# Patient Record
Sex: Male | Born: 1945
Health system: Southern US, Community
[De-identification: ages and names within clinical notes are randomized; demographics above are authoritative.]

## PROBLEM LIST (undated history)

## (undated) DIAGNOSIS — Z9289 Personal history of other medical treatment: Secondary | ICD-10-CM

## (undated) DIAGNOSIS — N200 Calculus of kidney: Secondary | ICD-10-CM

## (undated) DIAGNOSIS — I447 Left bundle-branch block, unspecified: Secondary | ICD-10-CM

## (undated) DIAGNOSIS — I10 Essential (primary) hypertension: Secondary | ICD-10-CM

## (undated) DIAGNOSIS — I2510 Atherosclerotic heart disease of native coronary artery without angina pectoris: Secondary | ICD-10-CM

## (undated) DIAGNOSIS — E785 Hyperlipidemia, unspecified: Secondary | ICD-10-CM

## (undated) DIAGNOSIS — N400 Enlarged prostate without lower urinary tract symptoms: Secondary | ICD-10-CM

## (undated) HISTORY — DX: Calculus of kidney: N20.0

## (undated) HISTORY — DX: Personal history of other medical treatment: Z92.89

## (undated) HISTORY — DX: Benign prostatic hyperplasia without lower urinary tract symptoms: N40.0

## (undated) HISTORY — DX: Left bundle-branch block, unspecified: I44.7

## (undated) HISTORY — DX: Hyperlipidemia, unspecified: E78.5

## (undated) HISTORY — PX: COLONOSCOPY: SHX174

---

## 1974-04-21 HISTORY — PX: APPENDECTOMY: SHX54

## 1997-08-18 ENCOUNTER — Other Ambulatory Visit: Admission: RE | Admit: 1997-08-18 | Discharge: 1997-08-18 | Payer: Self-pay | Admitting: Internal Medicine

## 2003-11-01 ENCOUNTER — Ambulatory Visit (HOSPITAL_COMMUNITY): Admission: RE | Admit: 2003-11-01 | Discharge: 2003-11-01 | Payer: Self-pay | Admitting: Gastroenterology

## 2011-10-15 ENCOUNTER — Encounter (HOSPITAL_COMMUNITY): Payer: Self-pay

## 2011-10-15 ENCOUNTER — Emergency Department (INDEPENDENT_AMBULATORY_CARE_PROVIDER_SITE_OTHER)
Admission: EM | Admit: 2011-10-15 | Discharge: 2011-10-15 | Disposition: A | Discharge status: Home / Self Care | Payer: 59 | Source: Home / Self Care | Attending: Emergency Medicine | Admitting: Emergency Medicine

## 2011-10-15 DIAGNOSIS — K649 Unspecified hemorrhoids: Secondary | ICD-10-CM

## 2011-10-15 HISTORY — DX: Essential (primary) hypertension: I10

## 2011-10-15 MED ORDER — HYDROCORTISONE ACE-PRAMOXINE 2.5-1 % RE CREA: TOPICAL_CREAM | Freq: Three times a day (TID) | RECTAL | Status: AC

## 2011-10-15 NOTE — Discharge Instructions (Signed)
Make sure you keep your stools soft. He may drink up to his, apparent used to make sure you drink plenty of water. Start taking MiraLax or another stool softener that works well for you. Followup with Dr. Loreta Ave in the next week if you're not getting better. Return to the ER if you have a fever above 100.4, if you start vomiting, or any other concerns.

## 2011-10-15 NOTE — ED Provider Notes (Signed)
History     CSN: [id number]  Arrival date & time 10/15/11  1450   First MD Initiated Contact with Patient 10/15/11 1613      Chief Complaint  Patient presents with  . Rectal Pain    (Consider location/radiation/quality/duration/timing/severity/associated sxs/prior treatment) HPI Comments: Patient presents complaining of rectal "fullness", started several days ago after doing some heavy lifting/digging a hole. Had normal, soft bowel movement this morning. Denies constipation, rectal bleeding, melena, hematochezia, abdominal pain. No urinary complaints. No unintentional weight loss. This is feels similar to previous episodes of hemorrhoids, and he has started using Preparation H without any improvement. No aggravating factors. He has had colonoscopy 5 years ago, states that it was normal. He has scheduled a have a colonoscopy in about a month.  ROS as noted in HPI. All other ROS negative.   The history is provided by the patient. No language interpreter was used.    Past Medical History  Diagnosis Date  . Hypertension   . Diabetes mellitus     pre diabetic    History reviewed. No pertinent past surgical history.  History reviewed. No pertinent family history.  History  Substance Use Topics  . Smoking status: Never Smoker   . Smokeless tobacco: Not on file  . Alcohol Use: No      Review of Systems  Allergies  Review of patient's allergies indicates no known allergies.  Home Medications   Current Outpatient Rx  Name Route Sig Dispense Refill  . VITAMIN C 1000 MG PO TABS Oral Take 1,000 mg by mouth daily.    Marland Kitchen EZETIMIBE-SIMVASTATIN 10-20 MG PO TABS Oral Take 1 tablet by mouth at bedtime.    . IBUPROFEN 200 MG PO TABS Oral Take 200 mg by mouth every morning.    Marland Kitchen METFORMIN HCL 1000 MG PO TABS Oral Take 500 mg by mouth once.    Marland Kitchen PHENYLEPH-SHARK LIV OIL-MO-PET 0.25-3-14-71.9 % RE OINT Rectal Place rectally 2 (two) times daily as needed.    Marland Kitchen VITAMIN B-12 100 MCG PO  TABS Oral Take 50 mcg by mouth 3 (three) times a week.    Marland Kitchen HYDROCORTISONE ACE-PRAMOXINE 2.5-1 % RE CREA Rectal Place rectally 3 (three) times daily. 30 g 0    BP 144/57  Pulse 70  Temp 98.4 F (36.9 C) (Oral)  Resp 20  SpO2 100%  Physical Exam  Nursing note and vitals reviewed. Constitutional: He is oriented to person, place, and time. He appears well-developed and well-nourished.  HENT:  Head: Normocephalic and atraumatic.  Eyes: Conjunctivae and EOM are normal.  Neck: Normal range of motion.  Cardiovascular: Normal rate.   Pulmonary/Chest: Effort normal. No respiratory distress.  Abdominal: He exhibits no distension.  Genitourinary: Prostate normal. Rectal exam shows internal hemorrhoid. Rectal exam shows no external hemorrhoid, no fissure, no mass, no tenderness and anal tone normal. Guaiac negative stool.       Several nonbleeding internal hemorrhoids. Soft stool in rectal vault.  Musculoskeletal: Normal range of motion.  Neurological: He is alert and oriented to person, place, and time. Coordination normal.  Skin: Skin is warm and dry.  Psychiatric: He has a normal mood and affect. His behavior is normal. Judgment and thought content normal.    ED Course  Procedures (including critical care time)   Labs Reviewed  OCCULT BLOOD, POC DEVICE   No results found.   1. Hemorrhoids     MDM  Previous records reviewed, was last seen by GI in 2005. No evidence  of external thrombosed hemorrhoids, active bleeding., With Analpram -HC, and Will have him followup with Dr. Loreta Ave, his GI physician. Advised increased fluid intake, to avoid heavy lifting, and to keep his stool soft. Will start MiraLax as needed. Discussed signs and symptoms that should prompt his return. Patient agrees with plan.  Luiz Blare, MD 10/15/11 2000

## 2011-10-15 NOTE — ED Notes (Signed)
States he has had a history of hemorrhoids in the past, usually reponds to home treatment , but has had pain past 2-3 days. Small  stool passed x 2 today

## 2015-04-27 MED FILL — SIMVASTATIN 20 MG TABLET: 20 | 90 days supply | Qty: 90 | Fill #0

## 2015-05-01 DIAGNOSIS — R05 Cough: Secondary | ICD-10-CM | POA: Diagnosis not present

## 2015-05-28 MED FILL — AZITHROMYCIN 250 MG TABLET: 250 | 5 days supply | Qty: 6 | Fill #0

## 2015-06-18 DIAGNOSIS — L309 Dermatitis, unspecified: Secondary | ICD-10-CM | POA: Diagnosis not present

## 2015-06-18 DIAGNOSIS — Z7984 Long term (current) use of oral hypoglycemic drugs: Secondary | ICD-10-CM | POA: Diagnosis not present

## 2015-06-18 DIAGNOSIS — E785 Hyperlipidemia, unspecified: Secondary | ICD-10-CM | POA: Diagnosis not present

## 2015-06-18 DIAGNOSIS — R5383 Other fatigue: Secondary | ICD-10-CM | POA: Diagnosis not present

## 2015-06-18 DIAGNOSIS — I10 Essential (primary) hypertension: Secondary | ICD-10-CM | POA: Diagnosis not present

## 2015-06-18 DIAGNOSIS — E119 Type 2 diabetes mellitus without complications: Secondary | ICD-10-CM | POA: Diagnosis not present

## 2015-06-18 MED FILL — TRIAMCINOLONE 0.1% CREAM: 0.1 | 15 days supply | Qty: 30 | Fill #0

## 2015-06-18 MED FILL — AMLODIPINE BESYLATE 10 MG T: 10 | 30 days supply | Qty: 30 | Fill #3

## 2015-06-20 MED FILL — METFORMIN HCL ER 500 MG TAB: 500 | 30 days supply | Qty: 60 | Fill #0

## 2015-07-05 DIAGNOSIS — G4710 Hypersomnia, unspecified: Secondary | ICD-10-CM | POA: Diagnosis not present

## 2015-07-11 DIAGNOSIS — R079 Chest pain, unspecified: Secondary | ICD-10-CM | POA: Diagnosis not present

## 2015-07-12 DIAGNOSIS — I4891 Unspecified atrial fibrillation: Secondary | ICD-10-CM | POA: Insufficient documentation

## 2015-07-12 DIAGNOSIS — R000 Tachycardia, unspecified: Secondary | ICD-10-CM | POA: Insufficient documentation

## 2015-07-17 ENCOUNTER — Encounter: Payer: Self-pay | Admitting: Physician Assistant

## 2015-07-17 ENCOUNTER — Ambulatory Visit (INDEPENDENT_AMBULATORY_CARE_PROVIDER_SITE_OTHER): Payer: PPO | Admitting: Physician Assistant

## 2015-07-17 VITALS — BP 142/60 | HR 68 | Ht 68.0 in | Wt 187.6 lb

## 2015-07-17 DIAGNOSIS — I447 Left bundle-branch block, unspecified: Secondary | ICD-10-CM

## 2015-07-17 DIAGNOSIS — R9431 Abnormal electrocardiogram [ECG] [EKG]: Secondary | ICD-10-CM

## 2015-07-17 DIAGNOSIS — R0602 Shortness of breath: Secondary | ICD-10-CM | POA: Diagnosis not present

## 2015-07-17 DIAGNOSIS — E119 Type 2 diabetes mellitus without complications: Secondary | ICD-10-CM

## 2015-07-17 DIAGNOSIS — I10 Essential (primary) hypertension: Secondary | ICD-10-CM

## 2015-07-17 DIAGNOSIS — E785 Hyperlipidemia, unspecified: Secondary | ICD-10-CM

## 2015-07-17 NOTE — Progress Notes (Signed)
Cardiology Office Note:    Date:  07/17/2015   ID:  [name] [name], DOB [birth year], MRN [id number]  PCP:  Jonathon Bellows, MD  Cardiologist:  New - Dr. Jenkins Rouge   Electrophysiologist:  N/a  Referring MD: Dr. Maurice Small   Chief Complaint  Patient presents with  . Shortness of Breath    Arm pain; LBBB    History of Present Illness:     [name] [name] is a 70 y.o. male with a hx of HTN, HL, DM2, FHx CAD.  He is referred by his PCP for further evaluation of DOE and episodic L arm pain.  He had his first episode of L arm pain in 04/2015 when he was sick with URI.  He had another episode of L arm pain 2 weeks ago.  This occurred after eating.  He denies any assoc dyspepsia or belching.  He did have assoc dyspnea and diaphoresis.  He denies any chest pain or exertional symptoms.  He has noted DOE.  His wife is more concerned about his dyspnea.  He does not feel like this is worsening.  He denies orthopnea, PND, edema, syncope.     Past Medical History  Diagnosis Date  . Hypertension   . Diabetes mellitus   . HLD (hyperlipidemia)   . BPH (benign prostatic hyperplasia)     [facility]  . Nephrolithiasis     Past Surgical History  Procedure Laterality Date  . Appendectomy  1976    Current Medications: Outpatient Prescriptions Prior to Visit  Medication Sig Dispense Refill  . Ascorbic Acid (VITAMIN C) 1000 MG tablet Take 1,000 mg by mouth daily.    Marland Kitchen ibuprofen (ADVIL,MOTRIN) 200 MG tablet Take 200 mg by mouth every 8 (eight) hours as needed (pain).     . phenylephrine-shark liver oil-mineral oil-petrolatum (PREPARATION H) 0.25-3-14-71.9 % rectal ointment Place rectally 2 (two) times daily as needed for hemorrhoids.     . vitamin B-12 (CYANOCOBALAMIN) 100 MCG tablet Take 50 mcg by mouth 3 (three) times a week.    . ezetimibe-simvastatin (VYTORIN) 10-20 MG per tablet Take 1 tablet by mouth at bedtime. Reported on 07/17/2015    . metFORMIN (GLUCOPHAGE) 1000 MG tablet Take 500 mg  by mouth once. Reported on 07/17/2015    . nebivolol (BYSTOLIC) 5 MG tablet Take 5 mg by mouth daily. Reported on 07/17/2015    . rivaroxaban (XARELTO) 20 MG TABS tablet Take 20 mg by mouth daily with supper. Reported on 07/17/2015     No facility-administered medications prior to visit.     Allergies:   Review of patient's allergies indicates no known allergies.   Social History   Social History  . Marital Status: Married    Spouse Name: N/A  . Number of Children: N/A  . Years of Education: N/A   Social History Main Topics  . Smoking status: Never Smoker   . Smokeless tobacco: None  . Alcohol Use: No  . Drug Use: No  . Sexual Activity: Not Asked   Other Topics Concern  . None   Social History Narrative   Retired   Married; 1 Licensed [employer] x 30 years   Originally from [place] History:  The patient's family history includes Heart attack (age of onset: 6) in his father; Heart disease in his father and mother; Heart failure (age of onset: 72) in his mother.   ROS:   Please see the  history of present illness.    Review of Systems  Cardiovascular: Positive for dyspnea on exertion.  Skin: Positive for rash.  All other systems reviewed and are negative.   Physical Exam:    VS:  BP 142/60 mmHg  Pulse 68  Ht 5\' 8"  (1.727 m)  Wt 187 lb 9.6 oz (85.095 kg)  BMI 28.53 kg/m2   GEN: Well nourished, well developed, in no acute distress HEENT: normal Neck: no JVD, no masses Cardiac: Normal S1/S2, RRR; short early 1/6 systolic murmur RUSB, rubs, or gallops, no edema;  no carotid bruits,   Respiratory:  clear to auscultation bilaterally; no wheezing, rhonchi or rales GI: soft, nontender, nondistended MS: no deformity or atrophy Skin: warm and dry Neuro: No focal deficits  Psych: Alert and oriented x 3, normal affect  Wt Readings from Last 3 Encounters:  07/17/15 187 lb 9.6 oz (85.095 kg)      Studies/Labs Reviewed:     EKG:   EKG is  ordered today.  The ekg ordered today demonstrates NSR, HR 69, LBBB  Recent Labs: No results found for requested labs within last 365 days.  Labs 06/18/15 (from PCP): Hgb A1c 7.5, Hgb 16.1, BUN 14, creatinine 1.09, K 4.5, ALT 48, AST 34, TSH 2.26  Recent Lipid Panel No results found for: CHOL, TRIG, HDL, CHOLHDL, VLDL, LDLCALC, LDLDIRECT  Additional studies/ records that were reviewed today include:   Echo 10/13 ([place]) Mild LVH, proximal septal thickening, EF > 55%, impaired relaxation, mild to mod LAE, mild MAC, mild MR, mild TR, RVSP 30-45mmHg,    ASSESSMENT:     1. Shortness of breath   2. LBBB (left bundle branch block)   3. Essential hypertension   4. Controlled type 2 diabetes mellitus without complication, without long-term current use of insulin ([place])   5. Hyperlipidemia     PLAN:     In order of problems listed above:  1. Dyspnea - Atypical symptoms for ischemia.  But, he has significant CRFs.  No stress test in 4 years.  His ECG demonstrates LBBB which is old.    -  Lexiscan Myoview  -  Echocardiogram  2. LBBB - Old.  Neg workup in the past.  Obtain stress test and echo as noted.  FU with Dr. Jenkins Rouge in 1 year or sooner if needed.  3. HTN - Borderline control.  Continue to monitor.  4. DM2 - FU with PCP for strict control. Recent A1c 7.5.  5. HL - Continue statin.  This is managed by PCP.     Medication Adjustments/Labs and Tests Ordered: Current medicines are reviewed at length with the patient today.  Concerns regarding medicines are outlined above.  Medication changes, Labs and Tests ordered today are outlined in the Patient Instructions noted below. Patient Instructions  Medication Instructions:  Your physician recommends that you continue on your current medications as directed. Please refer to the Current Medication list given to you today.  Labwork: NONE  Testing/Procedures: 1. Your physician has requested that you have a lexiscan  myoview. For further information please visit [email or web address].[email or web address]. Please follow instruction sheet, as given.  2. Your physician has requested that you have an echocardiogram. Echocardiography is a painless test that uses sound waves to create images of your heart. It provides your doctor with information about the size and shape of your heart and how well your heart's chambers and valves are working. This procedure takes approximately one hour. There are no restrictions for this procedure.  Follow-Up: Your physician wants you to follow-up in: Joiner Blima Singer will receive a reminder letter in the mail two months in advance. If you don't receive a letter, please call our office to schedule the follow-up appointment.  Any Other Special Instructions Will Be Listed Below (If Applicable).  If you need a refill on your cardiac medications before your next appointment, please call your pharmacy.   Signed, Richardson Dopp, PA-C  07/17/2015 8:57 AM    [facility] [address], [place], [place]  [address] Phone: [phone]; Fax: [phone]

## 2015-07-17 NOTE — Patient Instructions (Addendum)
Medication Instructions:  Your physician recommends that you continue on your current medications as directed. Please refer to the Current Medication list given to you today.  Labwork: NONE  Testing/Procedures: 1. Your physician has requested that you have a lexiscan myoview. For further information please visit HugeFiesta.[email or web address]. Please follow instruction sheet, as given.  2. Your physician has requested that you have an echocardiogram. Echocardiography is a painless test that uses sound waves to create images of your heart. It provides your doctor with information about the size and shape of your heart and how well your heart's chambers and valves are working. This procedure takes approximately one hour. There are no restrictions for this procedure.  Follow-Up: Your physician wants you to follow-up in: [name] [name] will receive a reminder letter in the mail two months in advance. If you don't receive a letter, please call our office to schedule the follow-up appointment.  Any Other Special Instructions Will Be Listed Below (If Applicable).  If you need a refill on your cardiac medications before your next appointment, please call your pharmacy.

## 2015-07-18 MED FILL — IRBESARTAN 300 MG TABLET: 300 | 30 days supply | Qty: 30 | Fill #0

## 2015-07-18 MED FILL — AMLODIPINE BESYLATE 10 MG T: 10 | 30 days supply | Qty: 30 | Fill #4

## 2015-07-23 MED FILL — METFORMIN HCL ER 500 MG TAB: 500 | 30 days supply | Qty: 60 | Fill #1

## 2015-07-30 ENCOUNTER — Telehealth (HOSPITAL_COMMUNITY): Payer: Self-pay | Admitting: *Deleted

## 2015-07-30 NOTE — Telephone Encounter (Signed)
Patient given detailed instructions per Myocardial Perfusion Study Information Sheet for the test on 08/01/15 at 0945. Patient notified to arrive 15 minutes early and that it is imperative to arrive on time for appointment to keep from having the test rescheduled.  If you need to cancel or reschedule your appointment, please call the office within 24 hours of your appointment. Failure to do so may result in a cancellation of your appointment, and a $50 no show fee. Patient verbalized understanding.Kord Monette, Ranae Palms

## 2015-08-01 ENCOUNTER — Encounter: Payer: Self-pay | Admitting: *Deleted

## 2015-08-01 ENCOUNTER — Other Ambulatory Visit: Payer: Self-pay | Admitting: Physician Assistant

## 2015-08-01 ENCOUNTER — Ambulatory Visit (HOSPITAL_BASED_OUTPATIENT_CLINIC_OR_DEPARTMENT_OTHER): Payer: PPO

## 2015-08-01 ENCOUNTER — Other Ambulatory Visit: Payer: Self-pay

## 2015-08-01 ENCOUNTER — Encounter: Payer: Self-pay | Admitting: Physician Assistant

## 2015-08-01 ENCOUNTER — Ambulatory Visit (HOSPITAL_COMMUNITY): Payer: PPO | Attending: Cardiovascular Disease

## 2015-08-01 ENCOUNTER — Ambulatory Visit (INDEPENDENT_AMBULATORY_CARE_PROVIDER_SITE_OTHER): Payer: PPO | Admitting: Physician Assistant

## 2015-08-01 VITALS — BP 145/63 | HR 76 | Ht 68.0 in | Wt 187.0 lb

## 2015-08-01 DIAGNOSIS — I447 Left bundle-branch block, unspecified: Secondary | ICD-10-CM | POA: Insufficient documentation

## 2015-08-01 DIAGNOSIS — E119 Type 2 diabetes mellitus without complications: Secondary | ICD-10-CM | POA: Diagnosis not present

## 2015-08-01 DIAGNOSIS — Z8249 Family history of ischemic heart disease and other diseases of the circulatory system: Secondary | ICD-10-CM | POA: Diagnosis not present

## 2015-08-01 DIAGNOSIS — I10 Essential (primary) hypertension: Secondary | ICD-10-CM | POA: Diagnosis not present

## 2015-08-01 DIAGNOSIS — R0609 Other forms of dyspnea: Secondary | ICD-10-CM | POA: Insufficient documentation

## 2015-08-01 DIAGNOSIS — E785 Hyperlipidemia, unspecified: Secondary | ICD-10-CM | POA: Diagnosis not present

## 2015-08-01 DIAGNOSIS — R9439 Abnormal result of other cardiovascular function study: Secondary | ICD-10-CM

## 2015-08-01 DIAGNOSIS — R0602 Shortness of breath: Secondary | ICD-10-CM | POA: Diagnosis not present

## 2015-08-01 LAB — CBC WITH DIFFERENTIAL/PLATELET
BASOS PCT: 1 %
Basophils Absolute: 62 cells/uL (ref 0–200)
EOS ABS: 310 {cells}/uL (ref 15–500)
Eosinophils Relative: 5 %
HEMATOCRIT: 44.4 % (ref 38.5–50.0)
HEMOGLOBIN: 15.4 g/dL (ref 13.2–17.1)
LYMPHS ABS: 1612 {cells}/uL (ref 850–3900)
LYMPHS PCT: 26 %
MCH: 31.7 pg (ref 27.0–33.0)
MCHC: 34.7 g/dL (ref 32.0–36.0)
MCV: 91.4 fL (ref 80.0–100.0)
MONO ABS: 744 {cells}/uL (ref 200–950)
MPV: 9.8 fL (ref 7.5–12.5)
Monocytes Relative: 12 %
NEUTROS PCT: 56 %
Neutro Abs: 3472 cells/uL (ref 1500–7800)
Platelets: 227 10*3/uL (ref 140–400)
RBC: 4.86 MIL/uL (ref 4.20–5.80)
RDW: 14.3 % (ref 11.0–15.0)
WBC: 6.2 10*3/uL (ref 3.8–10.8)

## 2015-08-01 LAB — MYOCARDIAL PERFUSION IMAGING
CHL CUP NUCLEAR SDS: 9
CHL CUP NUCLEAR SRS: 12
CHL CUP RESTING HR STRESS: 74 {beats}/min
LV dias vol: 80 mL (ref 62–150)
LV sys vol: 42 mL
Peak HR: 100 {beats}/min
RATE: 0.3
SSS: 21
TID: 0.96

## 2015-08-01 LAB — PROTIME-INR
INR: 1.04 (ref <1.50)
Prothrombin Time: 13.7 seconds (ref 11.6–15.2)

## 2015-08-01 MED ORDER — TECHNETIUM TC 99M SESTAMIBI GENERIC - CARDIOLITE
10.7000 | Freq: Once | INTRAVENOUS | Status: AC | PRN
  Administered 2015-08-01: 11 via INTRAVENOUS

## 2015-08-01 MED ORDER — TECHNETIUM TC 99M SESTAMIBI GENERIC - CARDIOLITE
32.3000 | Freq: Once | INTRAVENOUS | Status: AC | PRN
  Administered 2015-08-01: 32.3 via INTRAVENOUS

## 2015-08-01 MED ORDER — REGADENOSON 0.4 MG/5ML IV SOLN
0.4000 mg | Freq: Once | INTRAVENOUS | Status: AC
  Administered 2015-08-01: 0.4 mg via INTRAVENOUS

## 2015-08-01 MED ORDER — METOPROLOL TARTRATE 25 MG PO TABS: 12.5000 mg | ORAL_TABLET | Freq: Two times a day (BID) | ORAL | Status: DC

## 2015-08-01 MED FILL — METOPROLOL TARTRATE 25 MG T: 25 | 90 days supply | Qty: 90 | Fill #0

## 2015-08-01 NOTE — Patient Instructions (Signed)
Medication Instructions:  Your physician has recommended you make the following change in your medication:  1.  STOP Amlodipine 2.  START Metoprolol 25 mg taking 1/2 tablet twice a day   Labwork: TODAY:  BMET, CBC W/DIFF, & PT/INR   Testing/Procedures: Your physician has requested that you have a cardiac catheterization. Cardiac catheterization is used to diagnose and/or treat various heart conditions. Doctors may recommend this procedure for a number of different reasons. The most common reason is to evaluate chest pain. Chest pain can be a symptom of coronary artery disease (CAD), and cardiac catheterization can show whether plaque is narrowing or blocking your heart's arteries. This procedure is also used to evaluate the valves, as well as measure the blood flow and oxygen levels in different parts of your heart. For further information please visit [email or web address]. Please follow instruction sheet, as given.   Follow-Up: Your physician recommends that you schedule a follow-up appointment in: WILL BE SET UP AFTER YOUR HEART CATH   Any Other Special Instructions Will Be Listed Below (If Applicable). Coronary Angiogram A coronary angiogram, also called coronary angiography, is an X-ray procedure used to look at the arteries in the heart. In this procedure, a dye (contrast dye) is injected through a long, hollow tube (catheter). The catheter is about the size of a piece of cooked spaghetti and is inserted through your groin, wrist, or arm. The dye is injected into each artery, and X-rays are then taken to show if there is a blockage in the arteries of your heart. LET [facility] CARE PROVIDER KNOW ABOUT:  Any allergies you have, including allergies to shellfish or contrast dye.   All medicines you are taking, including vitamins, herbs, eye drops, creams, and over-the-counter medicines.   Previous problems you or members of your family have had with the use of anesthetics.   Any  blood disorders you have.   Previous surgeries you have had.  History of kidney problems or failure.   Other medical conditions you have. RISKS AND COMPLICATIONS  Generally, a coronary angiogram is a safe procedure. However, problems can occur and include:  Allergic reaction to the dye.  Bleeding from the access site or other locations.  Kidney injury, especially in people with impaired kidney function.  Stroke (rare).  Heart attack (rare). BEFORE THE PROCEDURE   Do not eat or drink anything after midnight the night before the procedure or as directed by your health care provider.   Ask your health care provider about changing or stopping your regular medicines. This is especially important if you are taking diabetes medicines or blood thinners. PROCEDURE  You may be given a medicine to help you relax (sedative) before the procedure. This medicine is given through an intravenous (IV) access tube that is inserted into one of your veins.   The area where the catheter will be inserted will be washed and shaved. This is usually done in the groin but may be done in the fold of your arm (near your elbow) or in the wrist.   A medicine will be given to numb the area where the catheter will be inserted (local anesthetic).   The health care provider will insert the catheter into an artery. The catheter will be guided by using a special type of X-ray (fluoroscopy) of the blood vessel being examined.   A special dye will then be injected into the catheter, and X-rays will be taken. The dye will help to show where any narrowing or blockages  are located in the heart arteries.  AFTER THE PROCEDURE   If the procedure is done through the leg, you will be kept in bed lying flat for several hours. You will be instructed to not bend or cross your legs.  The insertion site will be checked frequently.   The pulse in your feet or wrist will be checked frequently.   Additional blood  tests, X-rays, and an electrocardiogram may be done.    This information is not intended to replace advice given to you by your health care provider. Make sure you discuss any questions you have with your health care provider.   Document Released: 10/12/2002 Document Revised: 04/28/2014 Document Reviewed: 08/30/2012 Elsevier Interactive Patient Education [employer].     If you need a refill on your cardiac medications before your next appointment, please call your pharmacy.

## 2015-08-01 NOTE — Progress Notes (Signed)
Cardiology Office Note   Date:  08/01/2015   ID:  Rustin, Mcgonigle July 13, 1945, MRN [id number]  PCP:  Jonathon Bellows, MD  Cardiologist:  Dr. Johnsie Cancel    Chief Complaint  Patient presents with  . Follow-up    add-on from Nuc Med due to abnormal myoview. Seen with DOD Dr. Tamala Julian      History of Present Illness: [name] [name] is a 70 y.o. male who presents for evaluation of abnormal nuclear stress test. He has past medical history of hypertension, hyperlipidemia, DM, and family history of CAD with his father died of MI at age 35. He has been noticing increasing dyspnea on exertion in the past several months, however denies any obvious chest pain. He did have episode of arm pain in the setting of URI 2 months ago. He underwent Myoview today which came back abnormal showing inferolateral ischemia. He does have left bundle branch block making the EKG portion of Myoview nonspecific. Myoview perfusion image has been reviewed by Dr. Tamala Julian DOD, who recommended cardiac catheterization. He was added on to the flex clinic. Otherwise, he denies any chest discomfort or shortness breath at this time. His any known history of any issue.  We have discussed risk and benefit of cardiac catheterization including but not limited to bleeding, vascular injury, kidney injury, arrhythmia, MI, stroke, loss of limb. He is willing to proceed. He has been seen by Dr. Tamala Julian in the office as well. All questions regarding cardiac catheterization has been answered. Given his abnormal renal function, will obtain lab today and set him up to undergo cardiac catheterization on Friday.    Past Medical History  Diagnosis Date  . Hypertension   . Diabetes mellitus   . HLD (hyperlipidemia)   . BPH (benign prostatic hyperplasia)     [facility]  . Nephrolithiasis   . History of echocardiogram     Echo 4/17: EF 60-65%, no RWMA, Gr 1 DD  . LBBB (left bundle branch block)     Past Surgical History  Procedure Laterality  Date  . Appendectomy  1976     Current Outpatient Prescriptions  Medication Sig Dispense Refill  . aspirin 81 MG tablet Take 81 mg by mouth daily.    . Cholecalciferol (VITAMIN D) 2000 units tablet Take 2,000 Units by mouth daily.    Marland Kitchen ibuprofen (ADVIL,MOTRIN) 200 MG tablet Take 200 mg by mouth every 8 (eight) hours as needed (pain).     . metFORMIN (GLUCOPHAGE-XR) 500 MG 24 hr tablet Take 500 mg by mouth daily with breakfast.   11  . olmesartan (BENICAR) 40 MG tablet Take 40 mg by mouth daily.    . simvastatin (ZOCOR) 20 MG tablet Take 20 mg by mouth daily.    . vitamin B-12 (CYANOCOBALAMIN) 100 MCG tablet Take 50 mcg by mouth 3 (three) times a week.    . metoprolol tartrate (LOPRESSOR) 25 MG tablet Take 0.5 tablets (12.5 mg total) by mouth 2 (two) times daily. 180 tablet 3   No current facility-administered medications for this visit.    Allergies:   Review of patient's allergies indicates no known allergies.    Social History:  The patient  reports that he has never smoked. He does not have any smokeless tobacco history on file. He reports that he does not drink alcohol or use illicit drugs.   Family History:  The patient's family history includes Heart attack (age of onset: 48) in his father; Heart disease in  his father and mother; Heart failure (age of onset: 36) in his mother.    ROS:  Please see the history of present illness.   Otherwise, review of systems are positive for DOE.   All other systems are reviewed and negative.    PHYSICAL EXAM: VS:  BP 145/63 mmHg  Pulse 76  Ht 5\' 8"  (1.727 m)  Wt 187 lb (84.823 kg)  BMI 28.44 kg/m2 , BMI Body mass index is 28.44 kg/(m^2). GEN: Well nourished, well developed, in no acute distress HEENT: normal Neck: no JVD, carotid bruits, or masses Cardiac: RRR; no murmurs, rubs, or gallops,no edema  Respiratory:  clear to auscultation bilaterally, normal work of breathing GI: soft, nontender, nondistended, + BS MS: no deformity or  atrophy Skin: warm and dry, no rash Neuro:  Strength and sensation are intact Psych: euthymic mood, full affect   EKG:  EKG is not ordered today as patient had EKG strips done during myoview today, myoview EKG reviewed which showed NSR with LBBB   Recent Labs: No results found for requested labs within last 365 days.    Lipid Panel No results found for: CHOL, TRIG, HDL, CHOLHDL, VLDL, LDLCALC, LDLDIRECT    Wt Readings from Last 3 Encounters:  08/01/15 187 lb (84.823 kg)  08/01/15 187 lb (84.823 kg)  07/17/15 187 lb 9.6 oz (85.095 kg)      Other studies Reviewed: Additional studies/ records that were reviewed today include:   Echo 08/01/2015 LV EF: 60% - 65%  ------------------------------------------------------------------- Indications: LBBB (I44.7). SOB (R06.02).  ------------------------------------------------------------------- History: PMH: Acquired from the patient and from the patient&'s chart. Dyspnea. Risk factors: Family history of coronary artery disease. Hypertension. Diabetes mellitus. Dyslipidemia.  ------------------------------------------------------------------- Study Conclusions  - Left ventricle: The cavity size was normal. Wall thickness was  normal. Systolic function was normal. The estimated ejection  fraction was in the range of 60% to 65%. Wall motion was normal;  there were no regional wall motion abnormalities. Doppler  parameters are consistent with abnormal left ventricular  relaxation (grade 1 diastolic dysfunction).   Myoview 08/01/2015 Study Highlights     Nuclear stress EF: 47%.  There was no ST segment deviation noted during stress.  There is a large defect of severe severity present in the basal anteroseptal, mid anteroseptal, apical anterior, apical septal and apex location. The defect is partially reversible. This is consistent with scar with peri infarct ischemia.  There is a large defect of severe  severity present in the basal inferoseptal, basal inferior, basal inferolateral, mid inferoseptal, mid inferior, mid inferolateral and apical inferior location. The defect is partially reversible and consistent with infarct with peri infarct ischemia.  This is a high risk study.  The left ventricular ejection fraction is mildly decreased (45-54%).      Review of the above records demonstrates:   Patient was recently seen in the cardiology office for dyspnea on exertion, outpatient echocardiogram shows normal EF, outpatient Myoview today was severely abnormal.   ASSESSMENT AND PLAN:  1.  Abnormal myoview:   - no obvious CP, but did have DOE for several month  - patient seen with DOD Dr. Tamala Julian who recommended cardiac cath  - Risk and benefit of procedure explained to the patient who display clear understanding and agree to proceed. Discussed with patient possible procedural risk include bleeding, vascular injury, renal injury, arrythmia, MI, stroke and loss of limb or life.  2. HTN: BP high today, however patient states he did not home BP medication this  morning given myoview  3. HLD: on 20mg  Zocor  4. DM II: on metformin   Current medicines are reviewed at length with the patient today.  The patient does not have concerns regarding medicines.  The following changes have been made:  Add Metoprolol, stop amlodipine  Labs/ tests ordered today include:   Orders Placed This Encounter  Procedures  . Basic Metabolic Panel (BMET)  . CBC w/Diff  . INR/PT     Disposition:   FU with depend on cath result  Hilbert Corrigan, [place]  08/01/2015 4:44 PM    [address], [place], [place]  [address] Phone: [phone]; Fax: [phone]    The patient has been seen in conjunction with Almyra Deforest, PAC. All aspects of care have been considered and discussed. The patient has been personally interviewed, examined, and all clinical data has been  reviewed.   The patient had myocardial perfusion imaging performed today and was referred to the DOD because of the marked abnormality noted which included perfusion defects both anteriorly. Lateral and inferior walls.  The prior history, chart data, and recent H&P were completely reviewed.  The patient was interviewed and examined.  Given the marked abnormalities noted on the study, we have recommended proceeding with diagnostic coronary angiography assuming appropriate laboratory data does not reveal high risk of kidney injury, anemia, or of the problems that could either explain abnormalities noted or make the risk of the procedure unacceptable.

## 2015-08-02 ENCOUNTER — Telehealth: Payer: Self-pay | Admitting: *Deleted

## 2015-08-02 ENCOUNTER — Encounter: Payer: Self-pay | Admitting: Physician Assistant

## 2015-08-02 LAB — BASIC METABOLIC PANEL
BUN: 19 mg/dL (ref 7–25)
CHLORIDE: 100 mmol/L (ref 98–110)
CO2: 26 mmol/L (ref 20–31)
Calcium: 10.1 mg/dL (ref 8.6–10.3)
Creat: 1.09 mg/dL (ref 0.70–1.25)
GLUCOSE: 151 mg/dL — AB (ref 65–99)
POTASSIUM: 4.2 mmol/L (ref 3.5–5.3)
Sodium: 137 mmol/L (ref 135–146)

## 2015-08-02 NOTE — Telephone Encounter (Signed)
Pt notified of echo results by phone with verbal understanding 

## 2015-08-03 ENCOUNTER — Ambulatory Visit (HOSPITAL_COMMUNITY)
Admission: RE | Admit: 2015-08-03 | Discharge: 2015-08-03 | Disposition: A | Discharge status: Home / Self Care | Payer: PPO | Source: Ambulatory Visit | Attending: Interventional Cardiology | Admitting: Interventional Cardiology

## 2015-08-03 ENCOUNTER — Encounter (HOSPITAL_COMMUNITY)
Admission: RE | Disposition: A | Discharge status: Home / Self Care | Payer: Self-pay | Source: Ambulatory Visit | Attending: Interventional Cardiology

## 2015-08-03 DIAGNOSIS — Z7982 Long term (current) use of aspirin: Secondary | ICD-10-CM | POA: Diagnosis not present

## 2015-08-03 DIAGNOSIS — E119 Type 2 diabetes mellitus without complications: Secondary | ICD-10-CM | POA: Insufficient documentation

## 2015-08-03 DIAGNOSIS — I2582 Chronic total occlusion of coronary artery: Secondary | ICD-10-CM | POA: Diagnosis not present

## 2015-08-03 DIAGNOSIS — I25119 Atherosclerotic heart disease of native coronary artery with unspecified angina pectoris: Secondary | ICD-10-CM | POA: Diagnosis not present

## 2015-08-03 DIAGNOSIS — Z8249 Family history of ischemic heart disease and other diseases of the circulatory system: Secondary | ICD-10-CM | POA: Insufficient documentation

## 2015-08-03 DIAGNOSIS — Z7984 Long term (current) use of oral hypoglycemic drugs: Secondary | ICD-10-CM | POA: Diagnosis not present

## 2015-08-03 DIAGNOSIS — I447 Left bundle-branch block, unspecified: Secondary | ICD-10-CM | POA: Insufficient documentation

## 2015-08-03 DIAGNOSIS — I10 Essential (primary) hypertension: Secondary | ICD-10-CM

## 2015-08-03 DIAGNOSIS — E118 Type 2 diabetes mellitus with unspecified complications: Secondary | ICD-10-CM | POA: Diagnosis present

## 2015-08-03 DIAGNOSIS — R9439 Abnormal result of other cardiovascular function study: Secondary | ICD-10-CM | POA: Diagnosis not present

## 2015-08-03 DIAGNOSIS — I4891 Unspecified atrial fibrillation: Secondary | ICD-10-CM | POA: Diagnosis present

## 2015-08-03 DIAGNOSIS — Z79899 Other long term (current) drug therapy: Secondary | ICD-10-CM | POA: Diagnosis not present

## 2015-08-03 DIAGNOSIS — E785 Hyperlipidemia, unspecified: Secondary | ICD-10-CM | POA: Diagnosis not present

## 2015-08-03 DIAGNOSIS — I2510 Atherosclerotic heart disease of native coronary artery without angina pectoris: Secondary | ICD-10-CM

## 2015-08-03 HISTORY — PX: CARDIAC CATHETERIZATION: SHX172

## 2015-08-03 LAB — GLUCOSE, CAPILLARY
GLUCOSE-CAPILLARY: 138 mg/dL — AB (ref 65–99)
GLUCOSE-CAPILLARY: 128 mg/dL — AB (ref 65–99)

## 2015-08-03 SURGERY — LEFT HEART CATH AND CORONARY ANGIOGRAPHY
Anesthesia: LOCAL

## 2015-08-03 MED ORDER — IOPAMIDOL (ISOVUE-370) INJECTION 76%
INTRAVENOUS | Status: AC
  Filled 2015-08-03: qty 100

## 2015-08-03 MED ORDER — SODIUM CHLORIDE 0.9% FLUSH: 3.0000 mL | Freq: Two times a day (BID) | INTRAVENOUS | Status: DC

## 2015-08-03 MED ORDER — HEPARIN (PORCINE) IN NACL 2-0.9 UNIT/ML-% IJ SOLN
INTRAMUSCULAR | Status: DC | PRN
  Administered 2015-08-03: 1000 mL

## 2015-08-03 MED ORDER — SODIUM CHLORIDE 0.9 % IV SOLN: 250.0000 mL | INTRAVENOUS | Status: DC | PRN

## 2015-08-03 MED ORDER — HEPARIN (PORCINE) IN NACL 2-0.9 UNIT/ML-% IJ SOLN
INTRAMUSCULAR | Status: AC
  Filled 2015-08-03: qty 1000

## 2015-08-03 MED ORDER — NITROGLYCERIN 1 MG/10 ML FOR IR/CATH LAB
INTRA_ARTERIAL | Status: AC
  Filled 2015-08-03: qty 10

## 2015-08-03 MED ORDER — SODIUM CHLORIDE 0.9 % WEIGHT BASED INFUSION: 1.0000 mL/kg/h | INTRAVENOUS | Status: DC

## 2015-08-03 MED ORDER — ASPIRIN 81 MG PO CHEW: 81.0000 mg | CHEWABLE_TABLET | Freq: Every day | ORAL | Status: DC

## 2015-08-03 MED ORDER — NITROGLYCERIN 1 MG/10 ML FOR IR/CATH LAB
INTRA_ARTERIAL | Status: DC | PRN
  Administered 2015-08-03: 200 ug via INTRACORONARY

## 2015-08-03 MED ORDER — ACETAMINOPHEN 325 MG PO TABS: 650.0000 mg | ORAL_TABLET | ORAL | Status: DC | PRN

## 2015-08-03 MED ORDER — FENTANYL CITRATE (PF) 100 MCG/2ML IJ SOLN
INTRAMUSCULAR | Status: AC
  Filled 2015-08-03: qty 2

## 2015-08-03 MED ORDER — SODIUM CHLORIDE 0.9% FLUSH: 3.0000 mL | INTRAVENOUS | Status: DC | PRN

## 2015-08-03 MED ORDER — FENTANYL CITRATE (PF) 100 MCG/2ML IJ SOLN
INTRAMUSCULAR | Status: DC | PRN
  Administered 2015-08-03: 50 ug via INTRAVENOUS

## 2015-08-03 MED ORDER — MIDAZOLAM HCL 2 MG/2ML IJ SOLN
INTRAMUSCULAR | Status: AC
  Filled 2015-08-03: qty 2

## 2015-08-03 MED ORDER — ONDANSETRON HCL 4 MG/2ML IJ SOLN: 4.0000 mg | Freq: Four times a day (QID) | INTRAMUSCULAR | Status: DC | PRN

## 2015-08-03 MED ORDER — HEPARIN (PORCINE) IN NACL 2-0.9 UNIT/ML-% IJ SOLN
INTRAMUSCULAR | Status: AC
  Filled 2015-08-03: qty 500

## 2015-08-03 MED ORDER — MIDAZOLAM HCL 2 MG/2ML IJ SOLN
INTRAMUSCULAR | Status: DC | PRN
  Administered 2015-08-03 (×2): 1 mg via INTRAVENOUS

## 2015-08-03 MED ORDER — OXYCODONE-ACETAMINOPHEN 5-325 MG PO TABS: 1.0000 | ORAL_TABLET | ORAL | Status: DC | PRN

## 2015-08-03 MED ORDER — LIDOCAINE HCL (PF) 1 % IJ SOLN
INTRAMUSCULAR | Status: DC | PRN
  Administered 2015-08-03: 2 mL

## 2015-08-03 MED ORDER — HEPARIN SODIUM (PORCINE) 1000 UNIT/ML IJ SOLN
INTRAMUSCULAR | Status: DC | PRN
  Administered 2015-08-03: 5000 [IU] via INTRAVENOUS

## 2015-08-03 MED ORDER — LIDOCAINE HCL (PF) 1 % IJ SOLN
INTRAMUSCULAR | Status: AC
  Filled 2015-08-03: qty 30

## 2015-08-03 MED ORDER — SODIUM CHLORIDE 0.9 % WEIGHT BASED INFUSION: 3.0000 mL/kg/h | INTRAVENOUS | Status: DC

## 2015-08-03 MED ORDER — VERAPAMIL HCL 2.5 MG/ML IV SOLN
INTRAVENOUS | Status: AC
  Filled 2015-08-03: qty 2

## 2015-08-03 MED ORDER — SODIUM CHLORIDE 0.9 % WEIGHT BASED INFUSION
3.0000 mL/kg/h | INTRAVENOUS | Status: DC
  Administered 2015-08-03: 250 mL via INTRAVENOUS
  Administered 2015-08-03: 3 mL/kg/h via INTRAVENOUS

## 2015-08-03 MED ORDER — IOPAMIDOL (ISOVUE-370) INJECTION 76%
INTRAVENOUS | Status: DC | PRN
  Administered 2015-08-03: 125 mL via INTRA_ARTERIAL

## 2015-08-03 MED ORDER — ASPIRIN 81 MG PO CHEW: 81.0000 mg | CHEWABLE_TABLET | ORAL | Status: DC

## 2015-08-03 MED ORDER — VERAPAMIL HCL 2.5 MG/ML IV SOLN
INTRAVENOUS | Status: DC | PRN
  Administered 2015-08-03: 10 mL via INTRA_ARTERIAL

## 2015-08-03 SURGICAL SUPPLY — 11 items
CATH INFINITI 5 FR JL3.5 (CATHETERS) ×1 IMPLANT
CATH INFINITI JR4 5F (CATHETERS) ×1 IMPLANT
DEVICE RAD COMP TR BAND LRG (VASCULAR PRODUCTS) ×1 IMPLANT
GLIDESHEATH SLEND A-KIT 6F 22G (SHEATH) ×1 IMPLANT
KIT ENCORE 26 ADVANTAGE (KITS) IMPLANT
KIT HEART LEFT (KITS) ×2 IMPLANT
PACK CARDIAC CATHETERIZATION (CUSTOM PROCEDURE TRAY) ×2 IMPLANT
TRANSDUCER W/STOPCOCK (MISCELLANEOUS) ×2 IMPLANT
TUBING CIL FLEX 10 FLL-RA (TUBING) ×2 IMPLANT
WIRE HI TORQ VERSACORE-J 145CM (WIRE) ×1 IMPLANT
WIRE SAFE-T 1.5MM-J .035X260CM (WIRE) ×1 IMPLANT

## 2015-08-03 NOTE — Research (Signed)
CADL[facility] RESEARCH STUDY Informed Consent   Subject Name: [name] [name]  Subject met inclusion and exclusion criteria.  The informed consent form, study requirements and expectations were reviewed with the subject and questions and concerns were addressed prior to the signing of the consent form.  The subject verbalized understanding of the trail requirements.  The subject agreed to participate in the CADL[facility] RESEARCH trial and signed the informed consent.  The informed consent was obtained prior to performance of any protocol-specific procedures for the subject.  A copy of the signed informed consent was given to the subject and a copy was placed in the subject's medical record.  [name], [name] H 08/03/2015, 07:55  

## 2015-08-03 NOTE — Discharge Instructions (Signed)
Radial Site Care °Refer to this sheet in the next few weeks. These instructions provide you with information about caring for yourself after your procedure. Your health care provider may also give you more specific instructions. Your treatment has been planned according to current medical practices, but problems sometimes occur. Call your health care provider if you have any problems or questions after your procedure. °WHAT TO EXPECT AFTER THE PROCEDURE °After your procedure, it is typical to have the following: °· Bruising at the radial site that usually fades within 1-2 weeks. °· Blood collecting in the tissue (hematoma) that may be painful to the touch. It should usually decrease in size and tenderness within 1-2 weeks. °HOME CARE INSTRUCTIONS °· Take medicines only as directed by your health care provider. °· You may shower 24-48 hours after the procedure or as directed by your health care provider. Remove the bandage (dressing) and gently wash the site with plain soap and water. Pat the area dry with a clean towel. Do not rub the site, because this may cause bleeding. °· Do not take baths, swim, or use a hot tub until your health care provider approves. °· Check your insertion site every day for redness, swelling, or drainage. °· Do not apply powder or lotion to the site. °· Do not flex or bend the affected arm for 24 hours or as directed by your health care provider. °· Do not push or pull heavy objects with the affected arm for 24 hours or as directed by your health care provider. °· Do not lift over 10 lb (4.5 kg) for 5 days after your procedure or as directed by your health care provider. °· Ask your health care provider when it is okay to: °¨ Return to work or school. °¨ Resume usual physical activities or sports. °¨ Resume sexual activity. °· Do not drive home if you are discharged the same day as the procedure. Have someone else drive you. °· You may drive 24 hours after the procedure unless otherwise  instructed by your health care provider. °· Do not operate machinery or power tools for 24 hours after the procedure. °· If your procedure was done as an outpatient procedure, which means that you went home the same day as your procedure, a responsible adult should be with you for the first 24 hours after you arrive home. °· Keep all follow-up visits as directed by your health care provider. This is important. °SEEK MEDICAL CARE IF: °· You have a fever. °· You have chills. °· You have increased bleeding from the radial site. Hold pressure on the site. °SEEK IMMEDIATE MEDICAL CARE IF: °· You have unusual pain at the radial site. °· You have redness, warmth, or swelling at the radial site. °· You have drainage (other than a small amount of blood on the dressing) from the radial site. °· The radial site is bleeding, and the bleeding does not stop after 30 minutes of holding steady pressure on the site. °· Your arm or hand becomes pale, cool, tingly, or numb. °  °This information is not intended to replace advice given to you by your health care provider. Make sure you discuss any questions you have with your health care provider. °  °Document Released: 05/10/2010 Document Revised: 04/28/2014 Document Reviewed: 10/24/2013 °Elsevier Interactive Patient Education ©2016 [employer] ° °

## 2015-08-03 NOTE — H&P (View-Only) (Signed)
Cardiology Office Note   Date:  08/01/2015   ID:  [name] [name], [name] [name] November 26, 1945, MRN [id number]  PCP:  Jonathon Bellows, MD  Cardiologist:  Dr. Johnsie Cancel    Chief Complaint  Patient presents with  . Follow-up    add-on from Nuc Med due to abnormal myoview. Seen with DOD Dr. Tamala Julian      History of Present Illness: [name] [name] is a 70 y.o. male who presents for evaluation of abnormal nuclear stress test. He has past medical history of hypertension, hyperlipidemia, DM, and family history of CAD with his father died of MI at age 69. He has been noticing increasing dyspnea on exertion in the past several months, however denies any obvious chest pain. He did have episode of arm pain in the setting of URI 2 months ago. He underwent Myoview today which came back abnormal showing inferolateral ischemia. He does have left bundle branch block making the EKG portion of Myoview nonspecific. Myoview perfusion image has been reviewed by Dr. Tamala Julian DOD, who recommended cardiac catheterization. He was added on to the flex clinic. Otherwise, he denies any chest discomfort or shortness breath at this time. His any known history of any issue.  We have discussed risk and benefit of cardiac catheterization including but not limited to bleeding, vascular injury, kidney injury, arrhythmia, MI, stroke, loss of limb. He is willing to proceed. He has been seen by Dr. Tamala Julian in the office as well. All questions regarding cardiac catheterization has been answered. Given his abnormal renal function, will obtain lab today and set him up to undergo cardiac catheterization on Friday.    Past Medical History  Diagnosis Date  . Hypertension   . Diabetes mellitus   . HLD (hyperlipidemia)   . BPH (benign prostatic hyperplasia)     [facility]  . Nephrolithiasis   . History of echocardiogram     Echo 4/17: EF 60-65%, no RWMA, Gr 1 DD  . LBBB (left bundle branch block)     Past Surgical History  Procedure Laterality  Date  . Appendectomy  1976     Current Outpatient Prescriptions  Medication Sig Dispense Refill  . aspirin 81 MG tablet Take 81 mg by mouth daily.    . Cholecalciferol (VITAMIN D) 2000 units tablet Take 2,000 Units by mouth daily.    Marland Kitchen ibuprofen (ADVIL,MOTRIN) 200 MG tablet Take 200 mg by mouth every 8 (eight) hours as needed (pain).     . metFORMIN (GLUCOPHAGE-XR) 500 MG 24 hr tablet Take 500 mg by mouth daily with breakfast.   11  . olmesartan (BENICAR) 40 MG tablet Take 40 mg by mouth daily.    . simvastatin (ZOCOR) 20 MG tablet Take 20 mg by mouth daily.    . vitamin B-12 (CYANOCOBALAMIN) 100 MCG tablet Take 50 mcg by mouth 3 (three) times a week.    . metoprolol tartrate (LOPRESSOR) 25 MG tablet Take 0.5 tablets (12.5 mg total) by mouth 2 (two) times daily. 180 tablet 3   No current facility-administered medications for this visit.    Allergies:   Review of patient's allergies indicates no known allergies.    Social History:  The patient  reports that he has never smoked. He does not have any smokeless tobacco history on file. He reports that he does not drink alcohol or use illicit drugs.   Family History:  The patient's family history includes Heart attack (age of onset: 46) in his father; Heart disease in  his father and mother; Heart failure (age of onset: 69) in his mother.    ROS:  Please see the history of present illness.   Otherwise, review of systems are positive for DOE.   All other systems are reviewed and negative.    PHYSICAL EXAM: VS:  BP 145/63 mmHg  Pulse 76  Ht 5\' 8"  (1.727 m)  Wt 187 lb (84.823 kg)  BMI 28.44 kg/m2 , BMI Body mass index is 28.44 kg/(m^2). GEN: Well nourished, well developed, in no acute distress HEENT: normal Neck: no JVD, carotid bruits, or masses Cardiac: RRR; no murmurs, rubs, or gallops,no edema  Respiratory:  clear to auscultation bilaterally, normal work of breathing GI: soft, nontender, nondistended, + BS MS: no deformity or  atrophy Skin: warm and dry, no rash Neuro:  Strength and sensation are intact Psych: euthymic mood, full affect   EKG:  EKG is not ordered today as patient had EKG strips done during myoview today, myoview EKG reviewed which showed NSR with LBBB   Recent Labs: No results found for requested labs within last 365 days.    Lipid Panel No results found for: CHOL, TRIG, HDL, CHOLHDL, VLDL, LDLCALC, LDLDIRECT    Wt Readings from Last 3 Encounters:  08/01/15 187 lb (84.823 kg)  08/01/15 187 lb (84.823 kg)  07/17/15 187 lb 9.6 oz (85.095 kg)      Other studies Reviewed: Additional studies/ records that were reviewed today include:   Echo 08/01/2015 LV EF: 60% - 65%  ------------------------------------------------------------------- Indications: LBBB (I44.7). SOB (R06.02).  ------------------------------------------------------------------- History: PMH: Acquired from the patient and from the patient&'s chart. Dyspnea. Risk factors: Family history of coronary artery disease. Hypertension. Diabetes mellitus. Dyslipidemia.  ------------------------------------------------------------------- Study Conclusions  - Left ventricle: The cavity size was normal. Wall thickness was  normal. Systolic function was normal. The estimated ejection  fraction was in the range of 60% to 65%. Wall motion was normal;  there were no regional wall motion abnormalities. Doppler  parameters are consistent with abnormal left ventricular  relaxation (grade 1 diastolic dysfunction).   Myoview 08/01/2015 Study Highlights     Nuclear stress EF: 47%.  There was no ST segment deviation noted during stress.  There is a large defect of severe severity present in the basal anteroseptal, mid anteroseptal, apical anterior, apical septal and apex location. The defect is partially reversible. This is consistent with scar with peri infarct ischemia.  There is a large defect of severe  severity present in the basal inferoseptal, basal inferior, basal inferolateral, mid inferoseptal, mid inferior, mid inferolateral and apical inferior location. The defect is partially reversible and consistent with infarct with peri infarct ischemia.  This is a high risk study.  The left ventricular ejection fraction is mildly decreased (45-54%).      Review of the above records demonstrates:   Patient was recently seen in the cardiology office for dyspnea on exertion, outpatient echocardiogram shows normal EF, outpatient Myoview today was severely abnormal.   ASSESSMENT AND PLAN:  1.  Abnormal myoview:   - no obvious CP, but did have DOE for several month  - patient seen with DOD Dr. Tamala Julian who recommended cardiac cath  - Risk and benefit of procedure explained to the patient who display clear understanding and agree to proceed. Discussed with patient possible procedural risk include bleeding, vascular injury, renal injury, arrythmia, MI, stroke and loss of limb or life.  2. HTN: BP high today, however patient states he did not home BP medication this  morning given myoview  3. HLD: on 20mg  Zocor  4. DM II: on metformin   Current medicines are reviewed at length with the patient today.  The patient does not have concerns regarding medicines.  The following changes have been made:  Add Metoprolol, stop amlodipine  Labs/ tests ordered today include:   Orders Placed This Encounter  Procedures  . Basic Metabolic Panel (BMET)  . CBC w/Diff  . INR/PT     Disposition:   FU with depend on cath result  Hilbert Corrigan, [place]  08/01/2015 4:44 PM    [address], [place], [place]  [address] Phone: [phone]; Fax: [phone]    The patient has been seen in conjunction with Almyra Deforest, PAC. All aspects of care have been considered and discussed. The patient has been personally interviewed, examined, and all clinical data has been  reviewed.   The patient had myocardial perfusion imaging performed today and was referred to the DOD because of the marked abnormality noted which included perfusion defects both anteriorly. Lateral and inferior walls.  The prior history, chart data, and recent H&P were completely reviewed.  The patient was interviewed and examined.  Given the marked abnormalities noted on the study, we have recommended proceeding with diagnostic coronary angiography assuming appropriate laboratory data does not reveal high risk of kidney injury, anemia, or of the problems that could either explain abnormalities noted or make the risk of the procedure unacceptable.

## 2015-08-03 NOTE — Interval H&P Note (Signed)
Cath Lab Visit (complete for each Cath Lab visit)  Clinical Evaluation Leading to the Procedure:   ACS: No.  Non-ACS:    Anginal Classification: CCS III  Anti-ischemic medical therapy: Maximal Therapy (2 or more classes of medications)  Non-Invasive Test Results: High-risk stress test findings: cardiac mortality >3%/year  Prior CABG: No previous CABG      History and Physical Interval Note:  08/03/2015 11:54 AM  [name] [name]  has presented today for surgery, with the diagnosis of abnormal myoview  The various methods of treatment have been discussed with the patient and family. After consideration of risks, benefits and other options for treatment, the patient has consented to  Procedure(s): Left Heart Cath and Coronary Angiography (N/A) as a surgical intervention .  The patient's history has been reviewed, patient examined, no change in status, stable for surgery.  I have reviewed the patient's chart and labs.  Questions were answered to the patient's satisfaction.     Sinclair Grooms

## 2015-08-06 ENCOUNTER — Encounter (HOSPITAL_COMMUNITY): Payer: Self-pay | Admitting: Interventional Cardiology

## 2015-08-06 ENCOUNTER — Telehealth: Payer: Self-pay | Admitting: Interventional Cardiology

## 2015-08-06 DIAGNOSIS — I25119 Atherosclerotic heart disease of native coronary artery with unspecified angina pectoris: Secondary | ICD-10-CM

## 2015-08-06 DIAGNOSIS — R931 Abnormal findings on diagnostic imaging of heart and coronary circulation: Secondary | ICD-10-CM

## 2015-08-06 DIAGNOSIS — R55 Syncope and collapse: Secondary | ICD-10-CM

## 2015-08-06 NOTE — Telephone Encounter (Signed)
Spoke with pt's wife. Adv her that I have placed the ref to [facility] Dr.Gerhardt's office per Dr.Smith.pt needs an appt for CABG consideration. Dr.Gerhardt's office will call the pt directly to schedule. Provided their office phone number. Adv Mrs. Knoedler to call if she has not received a call from them by tomorrow afternoon. She voiced appreciation for the call back and verbalized understanding.

## 2015-08-06 NOTE — Telephone Encounter (Signed)
New Message:  Pt's wife is calling back to ask another question.

## 2015-08-06 NOTE — Telephone Encounter (Signed)
Returned pt wife call. She asked that we disregard the call. Nothing further needed at this time

## 2015-08-06 NOTE — Telephone Encounter (Signed)
[name] [name] is calling because they were told to call our office if they have not heard from Dr. Servando Snare by Charlette Caffey time to speak to [name] .  Per Dr. Tamala Julian .   Thanks

## 2015-08-08 ENCOUNTER — Encounter: Payer: PPO | Admitting: Thoracic Surgery (Cardiothoracic Vascular Surgery)

## 2015-08-08 ENCOUNTER — Other Ambulatory Visit: Payer: Self-pay | Admitting: *Deleted

## 2015-08-08 ENCOUNTER — Institutional Professional Consult (permissible substitution) (INDEPENDENT_AMBULATORY_CARE_PROVIDER_SITE_OTHER): Payer: PPO | Admitting: Surgery

## 2015-08-08 ENCOUNTER — Encounter: Payer: Self-pay | Admitting: Surgery

## 2015-08-08 VITALS — BP 160/85 | HR 66 | Resp 20 | Ht 67.0 in | Wt 188.0 lb

## 2015-08-08 DIAGNOSIS — I2510 Atherosclerotic heart disease of native coronary artery without angina pectoris: Secondary | ICD-10-CM

## 2015-08-08 NOTE — Progress Notes (Signed)
Cardiothoracic Surgery Consultation   PCP is WEBB, Valla Leaver, MD Referring Provider is Belva Crome, MD  Chief Complaint  Patient presents with  . Coronary Artery Disease    Surgical eval, cardiac cath 08/03/15, ECHO 08/01/15    HPI:  The patient is a 70 year old gentleman with a history of DM, hyperlipidemia, hypertension and a family history of premature coronary disease with his father dying at 22 from MI who began having shortness of breath, left arm dull aching pain and diaphoresis in December. He had a couple episodes in Dec and Jan and then a few more in Feb and March. His wife reports that he has been fatigued. He had a high risk myoview and catheterization showed severe multivessel CAD with a 90% proximal LAD, 100% proximal LCX and 99% proximal to mid RCA. Echo showed an EF of 60-65% with no significant valvular dysfunction. He denies any symptoms in about 4 weeks.  Past Medical History  Diagnosis Date  . Hypertension   . Diabetes mellitus   . HLD (hyperlipidemia)   . BPH (benign prostatic hyperplasia)     [facility]  . Nephrolithiasis   . History of echocardiogram     Echo 4/17: EF 60-65%, no RWMA, Gr 1 DD  . LBBB (left bundle branch block)   . History of nuclear stress test     Lexiscan Myoview 4/17:  Large defect in ant-septal, apical septal and apical wall and large defect in inf-septal inf, inf-lat, inf and apical inf wall c/w scar with peri-infarct ischemia, EF 47%; High Risk    Past Surgical History  Procedure Laterality Date  . Appendectomy  1976  . Cardiac catheterization N/A 08/03/2015    Procedure: Left Heart Cath and Coronary Angiography;  Surgeon: Belva Crome, MD;  Location: [facility];  Service: Cardiovascular;  Laterality: N/A;    Family History  Problem Relation Age of Onset  . Heart failure Mother 51  . Heart disease Mother   . Heart disease Father   . Heart attack Father 33    died from MI at 33    Social History Social  History  Substance Use Topics  . Smoking status: Never Smoker   . Smokeless tobacco: None  . Alcohol Use: No  Retired and lives with his wife.   Current Outpatient Prescriptions  Medication Sig Dispense Refill  . aspirin 81 MG tablet Take 81 mg by mouth daily.    . Cholecalciferol (VITAMIN D) 2000 units tablet Take 2,000 Units by mouth 3 (three) times a week.     . Cyanocobalamin (B-12) 2500 MCG TABS Take 1 tablet by mouth 3 (three) times a week.    Marland Kitchen ibuprofen (ADVIL,MOTRIN) 200 MG tablet Take 200 mg by mouth every 8 (eight) hours as needed (pain).     . metFORMIN (GLUCOPHAGE-XR) 500 MG 24 hr tablet Take 1,000 mg by mouth daily.   11  . metoprolol tartrate (LOPRESSOR) 25 MG tablet Take 0.5 tablets (12.5 mg total) by mouth 2 (two) times daily. 180 tablet 3  . olmesartan (BENICAR) 40 MG tablet Take 40 mg by mouth daily.    . simvastatin (ZOCOR) 20 MG tablet Take 20 mg by mouth daily.    Marland Kitchen triamcinolone cream (KENALOG) 0.1 % Apply 1 application topically 2 (two) times daily as needed.     No current facility-administered medications for this visit.    No Known Allergies  Review of Systems  Constitutional: Positive for diaphoresis, activity  change and fatigue. Negative for fever, appetite change and unexpected weight change.  HENT: Negative.   Eyes: Negative.   Respiratory: Positive for shortness of breath.   Cardiovascular: Negative for chest pain, palpitations and leg swelling.  Gastrointestinal: Negative.   Endocrine: Negative.   Genitourinary: Negative.   Musculoskeletal: Negative.   Skin:       Eczema on legs with rash that he scratches at night.  Allergic/Immunologic: Negative.   Neurological: Negative.   Hematological: Negative.   Psychiatric/Behavioral: Negative.     BP 160/85 mmHg  Pulse 66  Resp 20  Ht 5\' 7"  (1.702 m)  Wt 188 lb (85.276 kg)  BMI 29.44 kg/m2  SpO2 98% Physical Exam  Constitutional: He is oriented to person, place, and time. He appears  well-developed and well-nourished. No distress.  HENT:  Head: Normocephalic and atraumatic.  Mouth/Throat: Oropharynx is clear and moist.  Eyes: EOM are normal. Pupils are equal, round, and reactive to light.  Neck: Normal range of motion. Neck supple. No JVD present.  Cardiovascular: Normal rate, regular rhythm, normal heart sounds and intact distal pulses.   No murmur heard. Pulmonary/Chest: Effort normal and breath sounds normal. No respiratory distress. He has no rales.  Abdominal: Soft. Bowel sounds are normal. He exhibits no distension and no mass. There is no tenderness.  Musculoskeletal: Normal range of motion. He exhibits no edema.  Neurological: He is alert and oriented to person, place, and time.  Skin: Skin is warm and dry.  Scratches on right lower leg.  Psychiatric: He has a normal mood and affect.     Diagnostic Tests:   [facility]  [address], [place] [address]  [phone]  ------------------------------------------------------------------- Transthoracic Echocardiography  (Report amended )  Patient: [name] [name] MR #: [id number] Study Date: 08/01/2015 Gender: M Age: 47 Height: 172.7 cm Weight: 85 kg BSA: 2.04 m^2 Pt. Status: Room:  ATTENDING Mertie Moores, M.D. SONOGRAPHER Charlann Noss, RDCS ORDERING Kathlen Mody, Scott T REFERRING Kathlen Mody, Scott T PERFORMING [facility], Outpatient  cc:  ------------------------------------------------------------------- LV EF: 60% - 65%  ------------------------------------------------------------------- Indications: LBBB (I44.7). SOB (R06.02).  ------------------------------------------------------------------- History: PMH: Acquired from the patient and from the patient&'s chart. Dyspnea. Risk factors: Family history of coronary artery disease.  Hypertension. Diabetes mellitus. Dyslipidemia.  ------------------------------------------------------------------- Study Conclusions  - Left ventricle: The cavity size was normal. Wall thickness was  normal. Systolic function was normal. The estimated ejection  fraction was in the range of 60% to 65%. Wall motion was normal;  there were no regional wall motion abnormalities. Doppler  parameters are consistent with abnormal left ventricular  relaxation (grade 1 diastolic dysfunction).  Transthoracic echocardiography. M-mode, complete 2D, spectral Doppler, and color Doppler. Birthdate: Patient birthdate: May 05, 1945. Age: Patient is 70 yr old. Sex: Gender: male. BMI: 28.5 kg/m^2. Blood pressure: 142/60 Patient status: Outpatient. Study date: Study date: 08/01/2015. Study time: 08:27 AM. Location: [facility]  -------------------------------------------------------------------  ------------------------------------------------------------------- Left ventricle: The cavity size was normal. Wall thickness was normal. Systolic function was normal. The estimated ejection fraction was in the range of 60% to 65%. Wall motion was normal; there were no regional wall motion abnormalities. Doppler parameters are consistent with abnormal left ventricular relaxation (grade 1 diastolic dysfunction).  ------------------------------------------------------------------- Aortic valve: Mildly thickened, mildly calcified leaflets. Doppler: There was no stenosis.  ------------------------------------------------------------------- Aorta: Aortic root: The aortic root was normal in size. Ascending aorta: The ascending aorta was normal in size.  ------------------------------------------------------------------- Mitral valve: Structurally normal valve. Leaflet separation  was normal. Doppler: Transvalvular velocity was within the normal range. There was no  evidence for stenosis. There was no regurgitation.  ------------------------------------------------------------------- Left atrium: The atrium was normal in size.  ------------------------------------------------------------------- Right ventricle: The cavity size was normal. Systolic function was normal.  ------------------------------------------------------------------- Pulmonic valve: The valve appears to be grossly normal. Doppler: There was no significant regurgitation.  ------------------------------------------------------------------- Tricuspid valve: The valve appears to be grossly normal. Doppler: There was trivial regurgitation.  ------------------------------------------------------------------- Right atrium: The atrium was normal in size.  ------------------------------------------------------------------- Pericardium: There was no pericardial effusion.  ------------------------------------------------------------------- Measurements  Left ventricle Value Reference LV ID, ED, PLAX chordal (L) 37 mm 43 - 52 LV ID, ES, PLAX chordal 26 mm 23 - 38 LV fx shortening, PLAX chordal 30 % >=29 LV PW thickness, ED 10 mm --------- IVS/LV PW ratio, ED 0.7 <=1.3 LV e&', lateral 6.64 cm/s --------- LV E/e&', lateral 9.86 --------- LV e&', medial 2.61 cm/s --------- LV E/e&', medial 25.1 --------- LV e&', average 4.63 cm/s --------- LV E/e&', average 14.16 ---------  Ventricular septum Value Reference IVS thickness, ED 7 mm ---------  Aorta  Value Reference Aortic root ID 38 mm ---------  Left atrium Value Reference LA ID, A-P, ES 30 mm --------- LA ID/bsa, A-P 1.47 cm/m^2 <=2.2  Mitral valve Value Reference Mitral E-wave peak velocity 65.5 cm/s --------- Mitral A-wave peak velocity 96.5 cm/s --------- Mitral E/A ratio, peak 0.68 ---------  Systemic veins Value Reference Estimated CVP 3 mm Hg ---------  Right ventricle Value Reference RV s&', lateral, S 11 cm/s ---------  Legend: (L) and (H) mark values outside specified reference range.  ------------------------------------------------------------------- Jonne Ply, M.D. 2017-04-12T09:45:53      Nuclear Stress  Study Highlights    1. Nuclear stress EF: 47%. 2. There was no ST segment deviation noted during stress. 3. There is a large defect of severe severity present in the basal anteroseptal, mid anteroseptal, apical anterior, apical septal and apex location. The defect is partially reversible. This is consistent with scar with peri infarct ischemia. 4. There is a large defect of severe severity present in the basal inferoseptal, basal inferior, basal inferolateral, mid inferoseptal, mid inferior, mid inferolateral and apical inferior location. The defect is partially reversible and consistent with infarct with peri infarct ischemia. 5. This is a high risk study. 6. The left ventricular ejection fraction is mildly decreased (45-54%).       Belva Crome, MD (Primary)      Procedures    Left Heart Cath and Coronary Angiography    Conclusion    7. Prox RCA lesion, 99% stenosed. 8. Prox  Cx to Mid Cx lesion, 100% stenosed. 9. Mid LAD lesion, 90% stenosed. 10. Dist LAD lesion, 60% stenosed.   Severe three-vessel coronary disease in 70 year old diabetic patient with 90% proximal LAD arising after the second diagonal but before the first several perforated, total occlusion of the proximal circumflex, and high grade obstruction in the proximal RCA (dominant vessel)  Overall normal LV function with EF 55%   RECOMMENDATIONS:   CABG per TCTS     Indications    Coronary artery disease involving native coronary artery of native heart with angina pectoris (HCC) [I25.119 (ICD-10-CM)]   Abnormal nuclear cardiac imaging test [R93.1 (ICD-10-CM)]    Technique and Indications    The right radial area was sterilely prepped and draped. Intravenous sedation with Versed and fentanyl was administered. 1% Xylocaine was infiltrated to achieve local analgesia. A double wall stick with an angiocath  was utilized to obtain intra-arterial access. The modified Seldinger technique was used to place a 16F " Slender" sheath in the right radial artery. Weight based heparin was administered. Coronary angiography was done using 5 F catheters. Right coronary angiography was performed with a JR4. Left ventricular hemodymic recordings and angiography was done using the JR 4 catheter and hand injection. Left coronary angiography was performed with a JL 3.5 cm.  Hemostasis was achieved using a pneumatic band.  During this procedure the patient is administered a total of Versed 2 mg and Fentanyl 50 mcg to achieve and maintain moderate conscious sedation. The patient's heart rate, blood pressure, and oxygen saturation are monitored continuously during the procedure. The period of conscious sedation is 26 minutes, of which I was present face-to-face 100% of this time.Estimated blood loss <50 mL. There were no immediate complications during the procedure.    Coronary Findings    Dominance: Right   Left  Anterior Descending   . Mid LAD lesion, 90% stenosed. Eccentric.   Marland Kitchen Dist LAD lesion, 60% stenosed. Eccentric.     Left Circumflex   . Prox Cx to Mid Cx lesion, 100% stenosed.   . Second Obtuse Marginal Branch   2nd Mrg filled by collaterals from 1st Diag.     Right Coronary Artery   . Prox RCA lesion, 99% stenosed.      Wall Motion                 Coronary Diagrams    Diagnostic Diagram            Implants     No implant documentation for this case.    PACS Images    Show images for Cardiac catheterization     Link to Procedure Log    Procedure Log      Hemo Data       Most Recent Value   AO Systolic Pressure  99991111 mmHg   AO Diastolic Pressure  52 mmHg   AO Mean  79 mmHg   LV Systolic Pressure  123456 mmHg   LV Diastolic Pressure  5 mmHg   LV EDP  10 mmHg   Arterial Occlusion Pressure Extended Systolic Pressure  99991111 mmHg   Arterial Occlusion Pressure Extended Diastolic Pressure  58 mmHg   Arterial Occlusion Pressure Extended Mean Pressure  83 mmHg   Left Ventricular Apex Extended Systolic Pressure  99991111 mmHg   Left Ventricular Apex Extended Diastolic Pressure  5 mmHg   Left Ventricular Apex Extended EDP Pressure  15 mmHg    Impression:  I have personally reviewed his echo and cath films and have discussed the results with him and his wife.  He has severe multi-vessel coronary artery disease with a high risk nuclear stress test and stable exertional angina. I agree that CABG is the best treatment for this diabetic gentleman with tight coronary stenoses. I discussed the operative procedure with the patient and family including alternatives, benefits and risks; including but not limited to bleeding, blood transfusion, infection, stroke, myocardial infarction, graft failure, heart block requiring a permanent pacemaker, organ dysfunction, and death.  [name] understands and agrees to proceed.    Plan:  CABG on Monday  08/13/2015  Gaye Pollack, MD [facility] [phone]

## 2015-08-09 NOTE — Pre-Procedure Instructions (Signed)
[name] [name]  08/09/2015      [facility] - [place], [place] - [address] [place] [address] Phone: [phone] Fax: [phone]    Your procedure is scheduled on April 24.  Report to [facility] at [id number].  Call this number if you have problems the morning of surgery:  [phone]   Remember:  Do not eat food or drink liquids after midnight.  Take these medicines the morning of surgery with A SIP OF WATER Metoprolol tartrate (Lopressor)  Stop taking, Aspirin, Herbal medications, Fish Oil, Aspirin, BC's, Goody's, Aleve, Ibuprofen, Advil, Motrin, Vitamins   How to Manage Your Diabetes Before and After Surgery  Why is it important to control my blood sugar before and after surgery? . Improving blood sugar levels before and after surgery helps healing and can limit problems. . A way of improving blood sugar control is eating a healthy diet by: o  Eating less sugar and carbohydrates o  Increasing activity/exercise o  Talking with your doctor about reaching your blood sugar goals . High blood sugars (greater than 180 mg/dL) can raise your risk of infections and slow your recovery, so you will need to focus on controlling your diabetes during the weeks before surgery. . Make sure that the doctor who takes care of your diabetes knows about your planned surgery including the date and location.  How do I manage my blood sugar before surgery? . Check your blood sugar at least 4 times a day, starting 2 days before surgery, to make sure that the level is not too high or low. o Check your blood sugar the morning of your surgery when you wake up and every 2 hours until you get to the Short Stay unit. . If your blood sugar is less than 70 mg/dL, you will need to treat for low blood sugar: o Do not take insulin. o Treat a low blood sugar (less than 70 mg/dL) with  cup of clear juice (cranberry or apple), 4  glucose tablets, OR glucose gel. o Recheck blood sugar in 15 minutes after treatment (to make sure it is greater than 70 mg/dL). If your blood sugar is not greater than 70 mg/dL on recheck, call [phone] for further instructions. . Report your blood sugar to the short stay nurse when you get to Short Stay.  . If you are admitted to the hospital after surgery: o Your blood sugar will be checked by the staff and you will probably be given insulin after surgery (instead of oral diabetes medicines) to make sure you have good blood sugar levels. o The goal for blood sugar control after surgery is 80-180 mg/dL       WHAT DO I DO ABOUT MY DIABETES MEDICATION?   Marland Kitchen Do not take oral diabetes medicines (pills) the morning of surgery. Metformin (Glucophage-XR)   . The day of surgery, do not take other diabetes injectables, including Byetta (exenatide), Bydureon (exenatide ER), Victoza (liraglutide), or Trulicity (dulaglutide).  . If your CBG is greater than 220 mg/dL, you may take  of your sliding scale (correction) dose of insulin.  Other Instructions:          Patient Signature:  Date:   Nurse Signature:  Date:   Reviewed and Endorsed by [facility] Patient Education Committee, August 2015 Do not wear jewelry, make-up or nail polish.  Do not wear lotions, powders, or perfumes.  You may wear deodorant.  Do not shave 48 hours prior to surgery.  Men may shave face and neck.  Do not bring valuables to the hospital.  [facility] is not responsible for any belongings or valuables.  Contacts, dentures or bridgework may not be worn into surgery.  Leave your suitcase in the car.  After surgery it may be brought to your room.  For patients admitted to the hospital, discharge time will be determined by your treatment team.  Patients discharged the day of surgery will not be allowed to drive home.    Special instructions:   - Preparing for Surgery  Before surgery, you can  play an important role.  Because skin is not sterile, your skin needs to be as free of germs as possible.  You can reduce the number of germs on you skin by washing with CHG (chlorahexidine gluconate) soap before surgery.  CHG is an antiseptic cleaner which kills germs and bonds with the skin to continue killing germs even after washing.  Please DO NOT use if you have an allergy to CHG or antibacterial soaps.  If your skin becomes reddened/irritated stop using the CHG and inform your nurse when you arrive at Short Stay.  Do not shave (including legs and underarms) for at least 48 hours prior to the first CHG shower.  You may shave your face.  Please follow these instructions carefully:   1.  Shower with CHG Soap the night before surgery and the                                morning of Surgery.  2.  If you choose to wash your hair, wash your hair first as usual with your       normal shampoo.  3.  After you shampoo, rinse your hair and body thoroughly to remove the                      Shampoo.  4.  Use CHG as you would any other liquid soap.  You can apply chg directly       to the skin and wash gently with scrungie or a clean washcloth.  5.  Apply the CHG Soap to your body ONLY FROM THE NECK DOWN.        Do not use on open wounds or open sores.  Avoid contact with your eyes,       ears, mouth and genitals (private parts).  Wash genitals (private parts)       with your normal soap.  6.  Wash thoroughly, paying special attention to the area where your surgery        will be performed.  7.  Thoroughly rinse your body with warm water from the neck down.  8.  DO NOT shower/wash with your normal soap after using and rinsing off       the CHG Soap.  9.  Pat yourself dry with a clean towel.            10.  Wear clean pajamas.            11.  Place clean sheets on your bed the night of your first shower and do not        sleep with pets.  Day of Surgery  Do not apply any lotions/deoderants the morning  of surgery.  Please wear clean clothes to the hospital/surgery center.  Please read over the following fact sheets that you were given. Pain Booklet, Coughing and Deep Breathing, Blood Transfusion Information, MRSA Information and Surgical Site Infection Prevention

## 2015-08-10 ENCOUNTER — Ambulatory Visit (HOSPITAL_BASED_OUTPATIENT_CLINIC_OR_DEPARTMENT_OTHER)
Admission: RE | Admit: 2015-08-10 | Discharge: 2015-08-10 | Disposition: A | Discharge status: Home / Self Care | Payer: PPO | Source: Ambulatory Visit | Attending: Surgery | Admitting: Surgery

## 2015-08-10 ENCOUNTER — Encounter (HOSPITAL_COMMUNITY)
Admission: RE | Admit: 2015-08-10 | Discharge: 2015-08-10 | Disposition: A | Discharge status: Home / Self Care | Payer: PPO | Source: Ambulatory Visit | Attending: Surgery | Admitting: Surgery

## 2015-08-10 ENCOUNTER — Ambulatory Visit (HOSPITAL_COMMUNITY)
Admission: RE | Admit: 2015-08-10 | Discharge: 2015-08-10 | Disposition: A | Discharge status: Home / Self Care | Payer: PPO | Source: Ambulatory Visit | Attending: Surgery | Admitting: Surgery

## 2015-08-10 ENCOUNTER — Encounter (HOSPITAL_COMMUNITY): Payer: Self-pay

## 2015-08-10 VITALS — BP 149/73 | HR 64 | Temp 97.3°F | Resp 20 | Ht 67.0 in | Wt 188.9 lb

## 2015-08-10 DIAGNOSIS — I2582 Chronic total occlusion of coronary artery: Secondary | ICD-10-CM | POA: Diagnosis not present

## 2015-08-10 DIAGNOSIS — R9431 Abnormal electrocardiogram [ECG] [EKG]: Secondary | ICD-10-CM | POA: Insufficient documentation

## 2015-08-10 DIAGNOSIS — Z8249 Family history of ischemic heart disease and other diseases of the circulatory system: Secondary | ICD-10-CM | POA: Diagnosis not present

## 2015-08-10 DIAGNOSIS — I2510 Atherosclerotic heart disease of native coronary artery without angina pectoris: Secondary | ICD-10-CM

## 2015-08-10 DIAGNOSIS — Z01818 Encounter for other preprocedural examination: Secondary | ICD-10-CM

## 2015-08-10 DIAGNOSIS — K449 Diaphragmatic hernia without obstruction or gangrene: Secondary | ICD-10-CM | POA: Insufficient documentation

## 2015-08-10 DIAGNOSIS — E119 Type 2 diabetes mellitus without complications: Secondary | ICD-10-CM | POA: Diagnosis not present

## 2015-08-10 DIAGNOSIS — Z79899 Other long term (current) drug therapy: Secondary | ICD-10-CM | POA: Diagnosis not present

## 2015-08-10 DIAGNOSIS — R942 Abnormal results of pulmonary function studies: Secondary | ICD-10-CM | POA: Insufficient documentation

## 2015-08-10 DIAGNOSIS — E785 Hyperlipidemia, unspecified: Secondary | ICD-10-CM | POA: Diagnosis not present

## 2015-08-10 DIAGNOSIS — I080 Rheumatic disorders of both mitral and aortic valves: Secondary | ICD-10-CM | POA: Diagnosis not present

## 2015-08-10 DIAGNOSIS — Z7982 Long term (current) use of aspirin: Secondary | ICD-10-CM | POA: Diagnosis not present

## 2015-08-10 DIAGNOSIS — I25119 Atherosclerotic heart disease of native coronary artery with unspecified angina pectoris: Secondary | ICD-10-CM | POA: Diagnosis not present

## 2015-08-10 DIAGNOSIS — Z87442 Personal history of urinary calculi: Secondary | ICD-10-CM | POA: Diagnosis not present

## 2015-08-10 DIAGNOSIS — I447 Left bundle-branch block, unspecified: Secondary | ICD-10-CM | POA: Insufficient documentation

## 2015-08-10 DIAGNOSIS — I119 Hypertensive heart disease without heart failure: Secondary | ICD-10-CM | POA: Diagnosis not present

## 2015-08-10 DIAGNOSIS — Z01812 Encounter for preprocedural laboratory examination: Secondary | ICD-10-CM

## 2015-08-10 DIAGNOSIS — I4891 Unspecified atrial fibrillation: Secondary | ICD-10-CM | POA: Diagnosis not present

## 2015-08-10 DIAGNOSIS — Z7984 Long term (current) use of oral hypoglycemic drugs: Secondary | ICD-10-CM | POA: Diagnosis not present

## 2015-08-10 DIAGNOSIS — E8770 Fluid overload, unspecified: Secondary | ICD-10-CM | POA: Diagnosis not present

## 2015-08-10 DIAGNOSIS — Z01810 Encounter for preprocedural cardiovascular examination: Secondary | ICD-10-CM

## 2015-08-10 DIAGNOSIS — J9811 Atelectasis: Secondary | ICD-10-CM | POA: Diagnosis not present

## 2015-08-10 DIAGNOSIS — N400 Enlarged prostate without lower urinary tract symptoms: Secondary | ICD-10-CM | POA: Diagnosis not present

## 2015-08-10 DIAGNOSIS — L309 Dermatitis, unspecified: Secondary | ICD-10-CM | POA: Diagnosis not present

## 2015-08-10 DIAGNOSIS — Z951 Presence of aortocoronary bypass graft: Secondary | ICD-10-CM | POA: Diagnosis not present

## 2015-08-10 DIAGNOSIS — I509 Heart failure, unspecified: Secondary | ICD-10-CM | POA: Diagnosis not present

## 2015-08-10 DIAGNOSIS — D62 Acute posthemorrhagic anemia: Secondary | ICD-10-CM | POA: Diagnosis not present

## 2015-08-10 HISTORY — DX: Atherosclerotic heart disease of native coronary artery without angina pectoris: I25.10

## 2015-08-10 LAB — BLOOD GAS, ARTERIAL
ACID-BASE DEFICIT: 1.5 mmol/L (ref 0.0–2.0)
BICARBONATE: 22.5 meq/L (ref 20.0–24.0)
Drawn by: 20636
FIO2: 0.21
O2 Saturation: 96.5 %
PATIENT TEMPERATURE: 98.6
PCO2 ART: 36.4 mmHg (ref 35.0–45.0)
PO2 ART: 85.6 mmHg (ref 80.0–100.0)
TCO2: 23.6 mmol/L (ref 0–100)
pH, Arterial: 7.408 (ref 7.350–7.450)

## 2015-08-10 LAB — PROTIME-INR
INR: 1.07 (ref 0.00–1.49)
PROTHROMBIN TIME: 14.1 s (ref 11.6–15.2)

## 2015-08-10 LAB — URINALYSIS, ROUTINE W REFLEX MICROSCOPIC
Bilirubin Urine: NEGATIVE
GLUCOSE, UA: 100 mg/dL — AB
HGB URINE DIPSTICK: NEGATIVE
KETONES UR: NEGATIVE mg/dL
Leukocytes, UA: NEGATIVE
Nitrite: NEGATIVE
PH: 5.5 (ref 5.0–8.0)
PROTEIN: NEGATIVE mg/dL
Specific Gravity, Urine: 1.02 (ref 1.005–1.030)

## 2015-08-10 LAB — PULMONARY FUNCTION TEST
DL/VA % pred: 88 %
DL/VA: 3.9 ml/min/mmHg/L
DLCO UNC: 19.47 ml/min/mmHg
DLCO unc % pred: 68 %
FEF 25-75 POST: 3.5 L/s
FEF 25-75 Pre: 3.09 L/sec
FEF2575-%Change-Post: 13 %
FEF2575-%Pred-Post: 154 %
FEF2575-%Pred-Pre: 136 %
FEV1-%Change-Post: 1 %
FEV1-%PRED-POST: 93 %
FEV1-%PRED-PRE: 91 %
FEV1-POST: 2.74 L
FEV1-PRE: 2.69 L
FEV1FVC-%CHANGE-POST: 0 %
FEV1FVC-%Pred-Pre: 113 %
FEV6-%Change-Post: 1 %
FEV6-%PRED-PRE: 85 %
FEV6-%Pred-Post: 86 %
FEV6-POST: 3.25 L
FEV6-PRE: 3.18 L
FEV6FVC-%PRED-POST: 106 %
FEV6FVC-%PRED-PRE: 106 %
FVC-%Change-Post: 1 %
FVC-%PRED-PRE: 80 %
FVC-%Pred-Post: 81 %
FVC-POST: 3.25 L
FVC-PRE: 3.18 L
POST FEV6/FVC RATIO: 100 %
PRE FEV1/FVC RATIO: 84 %
Post FEV1/FVC ratio: 84 %
Pre FEV6/FVC Ratio: 100 %
RV % PRED: 91 %
RV: 2.06 L
TLC % PRED: 78 %
TLC: 5.07 L

## 2015-08-10 LAB — CBC
HCT: 42.6 % (ref 39.0–52.0)
Hemoglobin: 14.9 g/dL (ref 13.0–17.0)
MCH: 31.6 pg (ref 26.0–34.0)
MCHC: 35 g/dL (ref 30.0–36.0)
MCV: 90.3 fL (ref 78.0–100.0)
PLATELETS: 183 10*3/uL (ref 150–400)
RBC: 4.72 MIL/uL (ref 4.22–5.81)
RDW: 13.3 % (ref 11.5–15.5)
WBC: 6.8 10*3/uL (ref 4.0–10.5)

## 2015-08-10 LAB — TYPE AND SCREEN
ABO/RH(D): B POS
Antibody Screen: NEGATIVE

## 2015-08-10 LAB — GLUCOSE, CAPILLARY: Glucose-Capillary: 191 mg/dL — ABNORMAL HIGH (ref 65–99)

## 2015-08-10 LAB — COMPREHENSIVE METABOLIC PANEL
ALT: 37 U/L (ref 17–63)
ANION GAP: 12 (ref 5–15)
AST: 30 U/L (ref 15–41)
Albumin: 3.9 g/dL (ref 3.5–5.0)
Alkaline Phosphatase: 78 U/L (ref 38–126)
BUN: 16 mg/dL (ref 6–20)
CHLORIDE: 108 mmol/L (ref 101–111)
CO2: 19 mmol/L — AB (ref 22–32)
Calcium: 9.8 mg/dL (ref 8.9–10.3)
Creatinine, Ser: 1.21 mg/dL (ref 0.61–1.24)
GFR, EST NON AFRICAN AMERICAN: 60 mL/min — AB (ref >60)
Glucose, Bld: 195 mg/dL — ABNORMAL HIGH (ref 65–99)
Potassium: 4 mmol/L (ref 3.5–5.1)
SODIUM: 139 mmol/L (ref 135–145)
Total Bilirubin: 0.5 mg/dL (ref 0.3–1.2)
Total Protein: 6.7 g/dL (ref 6.5–8.1)

## 2015-08-10 LAB — APTT: APTT: 29 s (ref 24–37)

## 2015-08-10 LAB — SURGICAL PCR SCREEN
MRSA, PCR: NEGATIVE
Staphylococcus aureus: POSITIVE — AB

## 2015-08-10 LAB — ABO/RH: ABO/RH(D): B POS

## 2015-08-10 MED ORDER — ALBUTEROL SULFATE (2.5 MG/3ML) 0.083% IN NEBU
2.5000 mg | INHALATION_SOLUTION | Freq: Once | RESPIRATORY_TRACT | Status: AC
  Administered 2015-08-10: 2.5 mg via RESPIRATORY_TRACT

## 2015-08-10 MED FILL — MUPIROCIN 2% OINTMENT: 2 | 5 days supply | Qty: 22 | Fill #0

## 2015-08-10 NOTE — Progress Notes (Addendum)
PCP is Dr. Maurice Small Cardiologist is Dr Pernell Dupre He reports he does not check his CBG's, but last week it was 138 and today 191. States he had a sleep study done at [facility] states it was normal. Card cath noted from 08-03-15 Echo noted form 08-01-15 Stress test noted form 08-01-15

## 2015-08-10 NOTE — Progress Notes (Signed)
Mr Lees called and informed of positive pcr results, and to pick up script . (MSSA) Script called into [facility].

## 2015-08-10 NOTE — Progress Notes (Signed)
Pre-op Cardiac Surgery  Carotid Findings:  Bilateral: No significant (1-39%) ICA stenosis. Antegrade vertebral flow.    Upper Extremity Right Left  Brachial Pressures 152 150  Radial Waveforms Tri Tri  Ulnar Waveforms Tri Tri  Palmar Arch (Allen's Test) Decreases >50% with radial compression, normal with ulnar compression Decreases >50% with radial compression, normal with ulnar compression     Normal ABI study.  Lower  Extremity Right Left  Dorsalis Pedis    Anterior Tibial 162, Bi 152, Bi  Posterior Tibial 188, Tri 174, Tri  Ankle/Brachial Indices 1.24 1.14    Landry Mellow, RDMS, RVT 08/10/2015

## 2015-08-11 LAB — HEMOGLOBIN A1C
HEMOGLOBIN A1C: 7.1 % — AB (ref 4.8–5.6)
MEAN PLASMA GLUCOSE: 157 mg/dL

## 2015-08-12 MED ORDER — SODIUM CHLORIDE 0.9 % IV SOLN
INTRAVENOUS | Status: DC
  Filled 2015-08-12: qty 2.5

## 2015-08-12 MED ORDER — VANCOMYCIN HCL 10 G IV SOLR
1250.0000 mg | INTRAVENOUS | Status: AC
  Administered 2015-08-13: 1250 mg via INTRAVENOUS
  Filled 2015-08-12: qty 1250

## 2015-08-12 MED ORDER — PHENYLEPHRINE HCL 10 MG/ML IJ SOLN
30.0000 ug/min | INTRAVENOUS | Status: DC
  Filled 2015-08-12: qty 2

## 2015-08-12 MED ORDER — SODIUM CHLORIDE 0.9 % IV SOLN
INTRAVENOUS | Status: DC
  Filled 2015-08-12: qty 30

## 2015-08-12 MED ORDER — DOPAMINE-DEXTROSE 3.2-5 MG/ML-% IV SOLN
0.0000 ug/kg/min | INTRAVENOUS | Status: DC
  Filled 2015-08-12: qty 250

## 2015-08-12 MED ORDER — EPINEPHRINE HCL 1 MG/ML IJ SOLN
0.0000 ug/min | INTRAVENOUS | Status: DC
  Filled 2015-08-12: qty 4

## 2015-08-12 MED ORDER — DEXTROSE 5 % IV SOLN
750.0000 mg | INTRAVENOUS | Status: DC
  Filled 2015-08-12: qty 750

## 2015-08-12 MED ORDER — POTASSIUM CHLORIDE 2 MEQ/ML IV SOLN
80.0000 meq | INTRAVENOUS | Status: DC
  Filled 2015-08-12: qty 40

## 2015-08-12 MED ORDER — DEXTROSE 5 % IV SOLN
1.5000 g | INTRAVENOUS | Status: AC
  Administered 2015-08-13: .75 g via INTRAVENOUS
  Administered 2015-08-13: 1.5 g via INTRAVENOUS
  Filled 2015-08-12: qty 1.5

## 2015-08-12 MED ORDER — NITROGLYCERIN IN D5W 200-5 MCG/ML-% IV SOLN
2.0000 ug/min | INTRAVENOUS | Status: AC
  Administered 2015-08-13: 16.6 ug/min via INTRAVENOUS
  Filled 2015-08-12: qty 250

## 2015-08-12 MED ORDER — MAGNESIUM SULFATE 50 % IJ SOLN
40.0000 meq | INTRAMUSCULAR | Status: DC
  Filled 2015-08-12: qty 10

## 2015-08-12 MED ORDER — SODIUM CHLORIDE 0.9 % IV SOLN
INTRAVENOUS | Status: AC
  Administered 2015-08-13: 69.8 mL/h via INTRAVENOUS
  Filled 2015-08-12: qty 40

## 2015-08-12 MED ORDER — HEPARIN SODIUM (PORCINE) 1000 UNIT/ML IJ SOLN
INTRAMUSCULAR | Status: AC
Start: 2015-08-13 — End: 2015-08-13
  Administered 2015-08-13: 500 mL
  Filled 2015-08-12: qty 2.5

## 2015-08-12 MED ORDER — DEXMEDETOMIDINE HCL IN NACL 400 MCG/100ML IV SOLN
0.1000 ug/kg/h | INTRAVENOUS | Status: AC
  Administered 2015-08-13: .5 ug/kg/h via INTRAVENOUS
  Filled 2015-08-12: qty 100

## 2015-08-12 NOTE — H&P (Signed)
[address]       [place] [address]             [phone]      Cardiothoracic Surgery History and Physical   PCP is WEBB, Valla Leaver, MD Referring Provider is Belva Crome, MD  Chief Complaint  Patient presents with  . Coronary Artery Disease        HPI:  The patient is a 70 year old gentleman with a history of DM, hyperlipidemia, hypertension and a family history of premature coronary disease with his father dying at 77 from MI who began having shortness of breath, left arm dull aching pain and diaphoresis in December. He had a couple episodes in Dec and Jan and then a few more in Feb and March. His wife reports that he has been fatigued. He had a high risk myoview and catheterization showed severe multivessel CAD with a 90% proximal LAD, 100% proximal LCX and 99% proximal to mid RCA. Echo showed an EF of 60-65% with no significant valvular dysfunction. He denies any symptoms in about 4 weeks.  Past Medical History  Diagnosis Date  . Hypertension   . Diabetes mellitus   . HLD (hyperlipidemia)   . BPH (benign prostatic hyperplasia)     [facility]  . Nephrolithiasis   . History of echocardiogram     Echo 4/17: EF 60-65%, no RWMA, Gr 1 DD  . LBBB (left bundle branch block)   . History of nuclear stress test     Lexiscan Myoview 4/17: Large defect in ant-septal, apical septal and apical wall and large defect in inf-septal inf, inf-lat, inf and apical inf wall c/w scar with peri-infarct ischemia, EF 47%; High Risk    Past Surgical History  Procedure Laterality Date  . Appendectomy  1976  . Cardiac catheterization N/A 08/03/2015    Procedure: Left Heart Cath and Coronary Angiography; Surgeon: Belva Crome, MD; Location: [facility]; Service: Cardiovascular; Laterality: N/A;    Family History  Problem Relation Age of Onset  . Heart failure Mother 10  . Heart  disease Mother   . Heart disease Father   . Heart attack Father 4    died from MI at 7    Social History Social History  Substance Use Topics  . Smoking status: Never Smoker   . Smokeless tobacco: None  . Alcohol Use: No  Retired and lives with his wife.   Current Outpatient Prescriptions  Medication Sig Dispense Refill  . aspirin 81 MG tablet Take 81 mg by mouth daily.    . Cholecalciferol (VITAMIN D) 2000 units tablet Take 2,000 Units by mouth 3 (three) times a week.     . Cyanocobalamin (B-12) 2500 MCG TABS Take 1 tablet by mouth 3 (three) times a week.    Marland Kitchen ibuprofen (ADVIL,MOTRIN) 200 MG tablet Take 200 mg by mouth every 8 (eight) hours as needed (pain).     . metFORMIN (GLUCOPHAGE-XR) 500 MG 24 hr tablet Take 1,000 mg by mouth daily.   11  . metoprolol tartrate (LOPRESSOR) 25 MG tablet Take 0.5 tablets (12.5 mg total) by mouth 2 (two) times daily. 180 tablet 3  . olmesartan (BENICAR) 40 MG tablet Take 40 mg by mouth daily.    . simvastatin (ZOCOR) 20 MG tablet Take 20 mg by mouth daily.    Marland Kitchen triamcinolone cream (KENALOG) 0.1 % Apply 1 application topically 2 (two) times daily as needed.  No current facility-administered medications for this visit.    No Known Allergies  Review of Systems  Constitutional: Positive for diaphoresis, activity change and fatigue. Negative for fever, appetite change and unexpected weight change.  HENT: Negative.  Eyes: Negative.  Respiratory: Positive for shortness of breath.  Cardiovascular: Negative for chest pain, palpitations and leg swelling.  Gastrointestinal: Negative.  Endocrine: Negative.  Genitourinary: Negative.  Musculoskeletal: Negative.  Skin:   Eczema on legs with rash that he scratches at night.  Allergic/Immunologic: Negative.  Neurological: Negative.  Hematological: Negative.  Psychiatric/Behavioral: Negative.    BP  160/85 mmHg  Pulse 66  Resp 20  Ht 5\' 7"  (1.702 m)  Wt 188 lb (85.276 kg)  BMI 29.44 kg/m2  SpO2 98% Physical Exam  Constitutional: He is oriented to person, place, and time. He appears well-developed and well-nourished. No distress.  HENT:  Head: Normocephalic and atraumatic.  Mouth/Throat: Oropharynx is clear and moist.  Eyes: EOM are normal. Pupils are equal, round, and reactive to light.  Neck: Normal range of motion. Neck supple. No JVD present.  Cardiovascular: Normal rate, regular rhythm, normal heart sounds and intact distal pulses.  No murmur heard. Pulmonary/Chest: Effort normal and breath sounds normal. No respiratory distress. He has no rales.  Abdominal: Soft. Bowel sounds are normal. He exhibits no distension and no mass. There is no tenderness.  Musculoskeletal: Normal range of motion. He exhibits no edema.  Neurological: He is alert and oriented to person, place, and time.  Skin: Skin is warm and dry.  Scratches on right lower leg.  Psychiatric: He has a normal mood and affect.     Diagnostic Tests:   [facility]  [address], [place] [address]  [phone]  ------------------------------------------------------------------- Transthoracic Echocardiography  (Report amended )  Patient: [name] [name], [name] [name] MR #: [id number] Study Date: 08/01/2015 Gender: M Age: 29 Height: 172.7 cm Weight: 85 kg BSA: 2.04 m^2 Pt. Status: Room:  ATTENDING Mertie Moores, M.D. SONOGRAPHER Charlann Noss, RDCS ORDERING Kathlen Mody, Scott T REFERRING Kathlen Mody, Scott T PERFORMING [facility], Outpatient  cc:  ------------------------------------------------------------------- LV EF: 60% - 65%  ------------------------------------------------------------------- Indications: LBBB (I44.7). SOB  (R06.02).  ------------------------------------------------------------------- History: PMH: Acquired from the patient and from the patient&'s chart. Dyspnea. Risk factors: Family history of coronary artery disease. Hypertension. Diabetes mellitus. Dyslipidemia.  ------------------------------------------------------------------- Study Conclusions  - Left ventricle: The cavity size was normal. Wall thickness was  normal. Systolic function was normal. The estimated ejection  fraction was in the range of 60% to 65%. Wall motion was normal;  there were no regional wall motion abnormalities. Doppler  parameters are consistent with abnormal left ventricular  relaxation (grade 1 diastolic dysfunction).  Transthoracic echocardiography. M-mode, complete 2D, spectral Doppler, and color Doppler. Birthdate: Patient birthdate: 21-Apr-1946. Age: Patient is 70 yr old. Sex: Gender: male. BMI: 28.5 kg/m^2. Blood pressure: 142/60 Patient status: Outpatient. Study date: Study date: 08/01/2015. Study time: 08:27 AM. Location: [facility]  -------------------------------------------------------------------  ------------------------------------------------------------------- Left ventricle: The cavity size was normal. Wall thickness was normal. Systolic function was normal. The estimated ejection fraction was in the range of 60% to 65%. Wall motion was normal; there were no regional wall motion abnormalities. Doppler parameters are consistent with abnormal left ventricular relaxation (grade 1 diastolic dysfunction).  ------------------------------------------------------------------- Aortic valve: Mildly thickened, mildly calcified leaflets. Doppler: There was no stenosis.  ------------------------------------------------------------------- Aorta: Aortic root: The aortic root was normal in size. Ascending aorta: The ascending aorta was normal in  size.  -------------------------------------------------------------------  Mitral valve: Structurally normal valve. Leaflet separation was normal. Doppler: Transvalvular velocity was within the normal range. There was no evidence for stenosis. There was no regurgitation.  ------------------------------------------------------------------- Left atrium: The atrium was normal in size.  ------------------------------------------------------------------- Right ventricle: The cavity size was normal. Systolic function was normal.  ------------------------------------------------------------------- Pulmonic valve: The valve appears to be grossly normal. Doppler: There was no significant regurgitation.  ------------------------------------------------------------------- Tricuspid valve: The valve appears to be grossly normal. Doppler: There was trivial regurgitation.  ------------------------------------------------------------------- Right atrium: The atrium was normal in size.  ------------------------------------------------------------------- Pericardium: There was no pericardial effusion.  ------------------------------------------------------------------- Measurements  Left ventricle Value Reference LV ID, ED, PLAX chordal (L) 37 mm 43 - 52 LV ID, ES, PLAX chordal 26 mm 23 - 38 LV fx shortening, PLAX chordal 30 % >=29 LV PW thickness, ED 10 mm --------- IVS/LV PW ratio, ED 0.7 <=1.3 LV e&', lateral 6.64 cm/s --------- LV E/e&', lateral 9.86 --------- LV e&', medial 2.61 cm/s --------- LV E/e&', medial 25.1 --------- LV e&', average 4.63 cm/s --------- LV E/e&',  average 14.16 ---------  Ventricular septum Value Reference IVS thickness, ED 7 mm ---------  Aorta Value Reference Aortic root ID 38 mm ---------  Left atrium Value Reference LA ID, A-P, ES 30 mm --------- LA ID/bsa, A-P 1.47 cm/m^2 <=2.2  Mitral valve Value Reference Mitral E-wave peak velocity 65.5 cm/s --------- Mitral A-wave peak velocity 96.5 cm/s --------- Mitral E/A ratio, peak 0.68 ---------  Systemic veins Value Reference Estimated CVP 3 mm Hg ---------  Right ventricle Value Reference RV s&', lateral, S 11 cm/s ---------  Legend: (L) and (H) mark values outside specified reference range.  ------------------------------------------------------------------- Jonne Ply, M.D. 2017-04-12T09:45:53      Nuclear Stress  Study Highlights    1. Nuclear stress EF: 47%. 2. There was no ST segment deviation noted during stress. 3. There is a large defect of severe severity present in the basal anteroseptal, mid anteroseptal, apical anterior, apical septal and apex location. The defect is partially reversible. This is consistent with scar with peri infarct ischemia. 4. There is a large defect of severe severity present in the basal inferoseptal, basal inferior, basal inferolateral, mid inferoseptal, mid inferior, mid inferolateral and apical inferior location. The defect is partially reversible and consistent with infarct with peri infarct ischemia. 5. This is a high risk study. 6. The  left ventricular ejection fraction is mildly decreased (45-54%).       Belva Crome, MD (Primary)      Procedures    Left Heart Cath and Coronary Angiography    Conclusion    7. Prox RCA lesion, 99% stenosed. 8. Prox Cx to Mid Cx lesion, 100% stenosed. 9. Mid LAD lesion, 90% stenosed. 10. Dist LAD lesion, 60% stenosed.   Severe three-vessel coronary disease in 69 year old diabetic patient with 90% proximal LAD arising after the second diagonal but before the first several perforated, total occlusion of the proximal circumflex, and high grade obstruction in the proximal RCA (dominant vessel)  Overall normal LV function with EF 55%   RECOMMENDATIONS:   CABG per TCTS     Indications    Coronary artery disease involving native coronary artery of native heart with angina pectoris (HCC) [I25.119 (ICD-10-CM)]   Abnormal nuclear cardiac imaging test [R93.1 (ICD-10-CM)]    Technique and Indications    The right radial area was sterilely prepped and draped. Intravenous sedation with Versed and fentanyl was administered. 1% Xylocaine was infiltrated to achieve local analgesia. A  double wall stick with an angiocath was utilized to obtain intra-arterial access. The modified Seldinger technique was used to place a 35F " Slender" sheath in the right radial artery. Weight based heparin was administered. Coronary angiography was done using 5 F catheters. Right coronary angiography was performed with a JR4. Left ventricular hemodymic recordings and angiography was done using the JR 4 catheter and hand injection. Left coronary angiography was performed with a JL 3.5 cm.  Hemostasis was achieved using a pneumatic band.  During this procedure the patient is administered a total of Versed 2 mg and Fentanyl 50 mcg to achieve and maintain moderate conscious sedation. The patient's heart rate, blood pressure, and oxygen saturation are monitored continuously during  the procedure. The period of conscious sedation is 26 minutes, of which I was present face-to-face 100% of this time.Estimated blood loss <50 mL. There were no immediate complications during the procedure.    Coronary Findings    Dominance: Right   Left Anterior Descending   . Mid LAD lesion, 90% stenosed. Eccentric.   Marland Kitchen Dist LAD lesion, 60% stenosed. Eccentric.     Left Circumflex   . Prox Cx to Mid Cx lesion, 100% stenosed.   . Second Obtuse Marginal Branch   2nd Mrg filled by collaterals from 1st Diag.     Right Coronary Artery   . Prox RCA lesion, 99% stenosed.      Wall Motion                 Coronary Diagrams    Diagnostic Diagram            Implants    No implant documentation for this case.    PACS Images    Show images for Cardiac catheterization     Link to Procedure Log    Procedure Log      Hemo Data       Most Recent Value   AO Systolic Pressure  99991111 mmHg   AO Diastolic Pressure  52 mmHg   AO Mean  79 mmHg   LV Systolic Pressure  123456 mmHg   LV Diastolic Pressure  5 mmHg   LV EDP  10 mmHg   Arterial Occlusion Pressure Extended Systolic Pressure  99991111 mmHg   Arterial Occlusion Pressure Extended Diastolic Pressure  58 mmHg   Arterial Occlusion Pressure Extended Mean Pressure  83 mmHg   Left Ventricular Apex Extended Systolic Pressure  99991111 mmHg   Left Ventricular Apex Extended Diastolic Pressure  5 mmHg   Left Ventricular Apex Extended EDP Pressure  15 mmHg    Impression:  I have personally reviewed his echo and cath films and have discussed the results with him and his wife.  He has severe multi-vessel coronary artery disease with a high risk nuclear stress test and stable exertional angina. I agree that CABG is the best treatment for this diabetic gentleman with tight coronary stenoses. I discussed  the operative procedure with the patient and family including alternatives, benefits and risks; including but not limited to bleeding, blood transfusion, infection, stroke, myocardial infarction, graft failure, heart block requiring a permanent pacemaker, organ dysfunction, and death. [name] understands and agrees to proceed.   Plan:  CABG on Monday 08/13/2015  Gaye Pollack, MD [facility] [phone]

## 2015-08-12 NOTE — Anesthesia Preprocedure Evaluation (Addendum)
Anesthesia Evaluation  Patient identified by MRN, date of birth, ID band Patient awake    Reviewed: Allergy & Precautions, NPO status , Patient's Chart, lab work & pertinent test results  Airway Mallampati: II  TM Distance: >3 FB Neck ROM: Full    Dental  (+) Teeth Intact, Dental Advisory Given   Pulmonary    breath sounds clear to auscultation       Cardiovascular hypertension,  Rhythm:Regular Rate:Normal     Neuro/Psych    GI/Hepatic   Endo/Other  diabetes  Renal/GU      Musculoskeletal   Abdominal   Peds  Hematology   Anesthesia Other Findings   Reproductive/Obstetrics                           Anesthesia Physical Anesthesia Plan  ASA: III  Anesthesia Plan: General   Post-op Pain Management:    Induction: Intravenous  Airway Management Planned: Oral ETT  Additional Equipment: Arterial line, CVP, PA Cath and 3D TEE  Intra-op Plan:   Post-operative Plan:   Informed Consent: I have reviewed the patients History and Physical, chart, labs and discussed the procedure including the risks, benefits and alternatives for the proposed anesthesia with the patient or authorized representative who has indicated his/her understanding and acceptance.   Dental advisory given  Plan Discussed with: CRNA and Anesthesiologist  Anesthesia Plan Comments:         Anesthesia Quick Evaluation

## 2015-08-13 ENCOUNTER — Inpatient Hospital Stay (HOSPITAL_COMMUNITY): Payer: PPO | Admitting: Certified Registered"

## 2015-08-13 ENCOUNTER — Inpatient Hospital Stay (HOSPITAL_COMMUNITY)
Admission: RE | Admit: 2015-08-13 | Discharge: 2015-08-18 | DRG: 236 | Disposition: A | Discharge status: Home / Self Care | Payer: PPO | Source: Ambulatory Visit | Attending: Surgery | Admitting: Surgery

## 2015-08-13 ENCOUNTER — Inpatient Hospital Stay (HOSPITAL_COMMUNITY): Payer: PPO

## 2015-08-13 ENCOUNTER — Encounter (HOSPITAL_COMMUNITY)
Admission: RE | Disposition: A | Discharge status: Home / Self Care | Payer: Self-pay | Source: Ambulatory Visit | Attending: Surgery

## 2015-08-13 DIAGNOSIS — I119 Hypertensive heart disease without heart failure: Secondary | ICD-10-CM | POA: Diagnosis not present

## 2015-08-13 DIAGNOSIS — N400 Enlarged prostate without lower urinary tract symptoms: Secondary | ICD-10-CM | POA: Diagnosis not present

## 2015-08-13 DIAGNOSIS — E8770 Fluid overload, unspecified: Secondary | ICD-10-CM | POA: Diagnosis not present

## 2015-08-13 DIAGNOSIS — I25119 Atherosclerotic heart disease of native coronary artery with unspecified angina pectoris: Secondary | ICD-10-CM | POA: Diagnosis not present

## 2015-08-13 DIAGNOSIS — Z7982 Long term (current) use of aspirin: Secondary | ICD-10-CM | POA: Diagnosis not present

## 2015-08-13 DIAGNOSIS — J9811 Atelectasis: Secondary | ICD-10-CM | POA: Diagnosis not present

## 2015-08-13 DIAGNOSIS — Z951 Presence of aortocoronary bypass graft: Secondary | ICD-10-CM

## 2015-08-13 DIAGNOSIS — Z7984 Long term (current) use of oral hypoglycemic drugs: Secondary | ICD-10-CM | POA: Diagnosis not present

## 2015-08-13 DIAGNOSIS — D62 Acute posthemorrhagic anemia: Secondary | ICD-10-CM | POA: Diagnosis not present

## 2015-08-13 DIAGNOSIS — L309 Dermatitis, unspecified: Secondary | ICD-10-CM | POA: Diagnosis not present

## 2015-08-13 DIAGNOSIS — I2510 Atherosclerotic heart disease of native coronary artery without angina pectoris: Secondary | ICD-10-CM | POA: Diagnosis not present

## 2015-08-13 DIAGNOSIS — Z87442 Personal history of urinary calculi: Secondary | ICD-10-CM | POA: Diagnosis not present

## 2015-08-13 DIAGNOSIS — E119 Type 2 diabetes mellitus without complications: Secondary | ICD-10-CM | POA: Diagnosis not present

## 2015-08-13 DIAGNOSIS — I2582 Chronic total occlusion of coronary artery: Secondary | ICD-10-CM | POA: Diagnosis present

## 2015-08-13 DIAGNOSIS — I509 Heart failure, unspecified: Secondary | ICD-10-CM | POA: Diagnosis not present

## 2015-08-13 DIAGNOSIS — Z01818 Encounter for other preprocedural examination: Secondary | ICD-10-CM | POA: Diagnosis not present

## 2015-08-13 DIAGNOSIS — Z79899 Other long term (current) drug therapy: Secondary | ICD-10-CM | POA: Diagnosis not present

## 2015-08-13 DIAGNOSIS — I4891 Unspecified atrial fibrillation: Secondary | ICD-10-CM | POA: Diagnosis not present

## 2015-08-13 DIAGNOSIS — I447 Left bundle-branch block, unspecified: Secondary | ICD-10-CM | POA: Diagnosis present

## 2015-08-13 DIAGNOSIS — I080 Rheumatic disorders of both mitral and aortic valves: Secondary | ICD-10-CM | POA: Diagnosis not present

## 2015-08-13 DIAGNOSIS — E785 Hyperlipidemia, unspecified: Secondary | ICD-10-CM | POA: Diagnosis not present

## 2015-08-13 DIAGNOSIS — Z8249 Family history of ischemic heart disease and other diseases of the circulatory system: Secondary | ICD-10-CM

## 2015-08-13 DIAGNOSIS — I25709 Atherosclerosis of coronary artery bypass graft(s), unspecified, with unspecified angina pectoris: Secondary | ICD-10-CM

## 2015-08-13 DIAGNOSIS — I10 Essential (primary) hypertension: Secondary | ICD-10-CM

## 2015-08-13 HISTORY — PX: TEE WITHOUT CARDIOVERSION: SHX5443

## 2015-08-13 HISTORY — PX: CORONARY ARTERY BYPASS GRAFT: SHX141

## 2015-08-13 LAB — CBC
HCT: 31.5 % — ABNORMAL LOW (ref 39.0–52.0)
HCT: 32.1 % — ABNORMAL LOW (ref 39.0–52.0)
HEMOGLOBIN: 11.3 g/dL — AB (ref 13.0–17.0)
Hemoglobin: 11.2 g/dL — ABNORMAL LOW (ref 13.0–17.0)
MCH: 32.4 pg (ref 26.0–34.0)
MCH: 31.5 pg (ref 26.0–34.0)
MCHC: 35.9 g/dL (ref 30.0–36.0)
MCHC: 34.9 g/dL (ref 30.0–36.0)
MCV: 90.3 fL (ref 78.0–100.0)
MCV: 90.4 fL (ref 78.0–100.0)
PLATELETS: 115 10*3/uL — AB (ref 150–400)
Platelets: 123 10*3/uL — ABNORMAL LOW (ref 150–400)
RBC: 3.49 MIL/uL — AB (ref 4.22–5.81)
RBC: 3.55 MIL/uL — AB (ref 4.22–5.81)
RDW: 13.4 % (ref 11.5–15.5)
RDW: 13.3 % (ref 11.5–15.5)
WBC: 6.6 10*3/uL (ref 4.0–10.5)
WBC: 8.8 10*3/uL (ref 4.0–10.5)

## 2015-08-13 LAB — POCT I-STAT 3, ART BLOOD GAS (G3+)
ACID-BASE DEFICIT: 1 mmol/L (ref 0.0–2.0)
Acid-Base Excess: 2 mmol/L (ref 0.0–2.0)
Acid-Base Excess: 3 mmol/L — ABNORMAL HIGH (ref 0.0–2.0)
Acid-Base Excess: 1 mmol/L (ref 0.0–2.0)
Acid-base deficit: 3 mmol/L — ABNORMAL HIGH (ref 0.0–2.0)
BICARBONATE: 27.5 meq/L — AB (ref 20.0–24.0)
BICARBONATE: 22.1 meq/L (ref 20.0–24.0)
Bicarbonate: 28.1 mEq/L — ABNORMAL HIGH (ref 20.0–24.0)
Bicarbonate: 25.7 mEq/L — ABNORMAL HIGH (ref 20.0–24.0)
Bicarbonate: 22.7 mEq/L (ref 20.0–24.0)
O2 Saturation: 100 %
O2 Saturation: 100 %
O2 Saturation: 100 %
O2 Saturation: 92 %
O2 Saturation: 96 %
PCO2 ART: 46.7 mmHg — AB (ref 35.0–45.0)
PCO2 ART: 39 mmHg (ref 35.0–45.0)
PH ART: 7.379 (ref 7.350–7.450)
PH ART: 7.4 (ref 7.350–7.450)
PH ART: 7.409 (ref 7.350–7.450)
PO2 ART: 62 mmHg — AB (ref 80.0–100.0)
Patient temperature: 35.5
TCO2: 29 mmol/L (ref 0–100)
TCO2: 29 mmol/L (ref 0–100)
TCO2: 27 mmol/L (ref 0–100)
TCO2: 24 mmol/L (ref 0–100)
TCO2: 23 mmol/L (ref 0–100)
pCO2 arterial: 45.3 mmHg — ABNORMAL HIGH (ref 35.0–45.0)
pCO2 arterial: 40.6 mmHg (ref 35.0–45.0)
pCO2 arterial: 32.3 mmHg — ABNORMAL LOW (ref 35.0–45.0)
pH, Arterial: 7.366 (ref 7.350–7.450)
pH, Arterial: 7.446 (ref 7.350–7.450)
pO2, Arterial: 308 mmHg — ABNORMAL HIGH (ref 80.0–100.0)
pO2, Arterial: 446 mmHg — ABNORMAL HIGH (ref 80.0–100.0)
pO2, Arterial: 268 mmHg — ABNORMAL HIGH (ref 80.0–100.0)
pO2, Arterial: 80 mmHg (ref 80.0–100.0)

## 2015-08-13 LAB — GLUCOSE, CAPILLARY
GLUCOSE-CAPILLARY: 121 mg/dL — AB (ref 65–99)
GLUCOSE-CAPILLARY: 133 mg/dL — AB (ref 65–99)
GLUCOSE-CAPILLARY: 138 mg/dL — AB (ref 65–99)
GLUCOSE-CAPILLARY: 137 mg/dL — AB (ref 65–99)
GLUCOSE-CAPILLARY: 111 mg/dL — AB (ref 65–99)
Glucose-Capillary: 109 mg/dL — ABNORMAL HIGH (ref 65–99)
Glucose-Capillary: 117 mg/dL — ABNORMAL HIGH (ref 65–99)
Glucose-Capillary: 129 mg/dL — ABNORMAL HIGH (ref 65–99)
Glucose-Capillary: 131 mg/dL — ABNORMAL HIGH (ref 65–99)
Glucose-Capillary: 140 mg/dL — ABNORMAL HIGH (ref 65–99)
Glucose-Capillary: 149 mg/dL — ABNORMAL HIGH (ref 65–99)
Glucose-Capillary: 151 mg/dL — ABNORMAL HIGH (ref 65–99)

## 2015-08-13 LAB — POCT I-STAT, CHEM 8
BUN: 17 mg/dL (ref 6–20)
BUN: 17 mg/dL (ref 6–20)
BUN: 13 mg/dL (ref 6–20)
BUN: 15 mg/dL (ref 6–20)
BUN: 14 mg/dL (ref 6–20)
BUN: 13 mg/dL (ref 6–20)
CALCIUM ION: 1.26 mmol/L (ref 1.13–1.30)
CALCIUM ION: 0.87 mmol/L — AB (ref 1.13–1.30)
CALCIUM ION: 0.99 mmol/L — AB (ref 1.13–1.30)
CALCIUM ION: 1.13 mmol/L (ref 1.13–1.30)
CHLORIDE: 101 mmol/L (ref 101–111)
CHLORIDE: 97 mmol/L — AB (ref 101–111)
CHLORIDE: 98 mmol/L — AB (ref 101–111)
Calcium, Ion: 1.27 mmol/L (ref 1.13–1.30)
Calcium, Ion: 1 mmol/L — ABNORMAL LOW (ref 1.13–1.30)
Chloride: 102 mmol/L (ref 101–111)
Chloride: 98 mmol/L — ABNORMAL LOW (ref 101–111)
Chloride: 104 mmol/L (ref 101–111)
Creatinine, Ser: 0.9 mg/dL (ref 0.61–1.24)
Creatinine, Ser: 0.9 mg/dL (ref 0.61–1.24)
Creatinine, Ser: 0.5 mg/dL — ABNORMAL LOW (ref 0.61–1.24)
Creatinine, Ser: 0.7 mg/dL (ref 0.61–1.24)
Creatinine, Ser: 0.8 mg/dL (ref 0.61–1.24)
Creatinine, Ser: 1 mg/dL (ref 0.61–1.24)
GLUCOSE: 151 mg/dL — AB (ref 65–99)
GLUCOSE: 123 mg/dL — AB (ref 65–99)
Glucose, Bld: 166 mg/dL — ABNORMAL HIGH (ref 65–99)
Glucose, Bld: 114 mg/dL — ABNORMAL HIGH (ref 65–99)
Glucose, Bld: 132 mg/dL — ABNORMAL HIGH (ref 65–99)
Glucose, Bld: 142 mg/dL — ABNORMAL HIGH (ref 65–99)
HCT: 42 % (ref 39.0–52.0)
HCT: 33 % — ABNORMAL LOW (ref 39.0–52.0)
HEMATOCRIT: 38 % — AB (ref 39.0–52.0)
HEMATOCRIT: 28 % — AB (ref 39.0–52.0)
HEMATOCRIT: 27 % — AB (ref 39.0–52.0)
HEMATOCRIT: 26 % — AB (ref 39.0–52.0)
HEMOGLOBIN: 14.3 g/dL (ref 13.0–17.0)
HEMOGLOBIN: 12.9 g/dL — AB (ref 13.0–17.0)
HEMOGLOBIN: 9.5 g/dL — AB (ref 13.0–17.0)
HEMOGLOBIN: 9.2 g/dL — AB (ref 13.0–17.0)
HEMOGLOBIN: 11.2 g/dL — AB (ref 13.0–17.0)
Hemoglobin: 8.8 g/dL — ABNORMAL LOW (ref 13.0–17.0)
POTASSIUM: 4.1 mmol/L (ref 3.5–5.1)
POTASSIUM: 4.2 mmol/L (ref 3.5–5.1)
POTASSIUM: 4.5 mmol/L (ref 3.5–5.1)
POTASSIUM: 5 mmol/L (ref 3.5–5.1)
Potassium: 4.4 mmol/L (ref 3.5–5.1)
Potassium: 4.4 mmol/L (ref 3.5–5.1)
SODIUM: 140 mmol/L (ref 135–145)
SODIUM: 138 mmol/L (ref 135–145)
SODIUM: 135 mmol/L (ref 135–145)
SODIUM: 137 mmol/L (ref 135–145)
SODIUM: 138 mmol/L (ref 135–145)
Sodium: 138 mmol/L (ref 135–145)
TCO2: 26 mmol/L (ref 0–100)
TCO2: 29 mmol/L (ref 0–100)
TCO2: 28 mmol/L (ref 0–100)
TCO2: 28 mmol/L (ref 0–100)
TCO2: 25 mmol/L (ref 0–100)
TCO2: 22 mmol/L (ref 0–100)

## 2015-08-13 LAB — PLATELET COUNT: PLATELETS: 121 10*3/uL — AB (ref 150–400)

## 2015-08-13 LAB — POCT I-STAT 4, (NA,K, GLUC, HGB,HCT)
Glucose, Bld: 125 mg/dL — ABNORMAL HIGH (ref 65–99)
HEMATOCRIT: 32 % — AB (ref 39.0–52.0)
HEMOGLOBIN: 10.9 g/dL — AB (ref 13.0–17.0)
POTASSIUM: 4 mmol/L (ref 3.5–5.1)
SODIUM: 139 mmol/L (ref 135–145)

## 2015-08-13 LAB — MAGNESIUM: Magnesium: 3 mg/dL — ABNORMAL HIGH (ref 1.7–2.4)

## 2015-08-13 LAB — CREATININE, SERUM: Creatinine, Ser: 1.06 mg/dL (ref 0.61–1.24)

## 2015-08-13 LAB — APTT: aPTT: 30 seconds (ref 24–37)

## 2015-08-13 LAB — HEMOGLOBIN AND HEMATOCRIT, BLOOD
HCT: 26.6 % — ABNORMAL LOW (ref 39.0–52.0)
HEMOGLOBIN: 9.3 g/dL — AB (ref 13.0–17.0)

## 2015-08-13 LAB — PROTIME-INR
INR: 1.69 — ABNORMAL HIGH (ref 0.00–1.49)
Prothrombin Time: 19.9 seconds — ABNORMAL HIGH (ref 11.6–15.2)

## 2015-08-13 SURGERY — CORONARY ARTERY BYPASS GRAFTING (CABG)
Anesthesia: General | Site: Chest

## 2015-08-13 MED ORDER — DEXMEDETOMIDINE HCL IN NACL 200 MCG/50ML IV SOLN
0.0000 ug/kg/h | INTRAVENOUS | Status: DC
  Administered 2015-08-13: 0.5 ug/kg/h via INTRAVENOUS
  Filled 2015-08-13 (×2): qty 50

## 2015-08-13 MED ORDER — OXYCODONE HCL 5 MG PO TABS
5.0000 mg | ORAL_TABLET | ORAL | Status: DC | PRN
  Administered 2015-08-14 (×2): 10 mg via ORAL
  Filled 2015-08-13 (×2): qty 2

## 2015-08-13 MED ORDER — MORPHINE SULFATE (PF) 2 MG/ML IV SOLN
2.0000 mg | INTRAVENOUS | Status: DC | PRN
  Administered 2015-08-14: 2 mg via INTRAVENOUS
  Administered 2015-08-14: 4 mg via INTRAVENOUS
  Administered 2015-08-14: 2 mg via INTRAVENOUS
  Filled 2015-08-13 (×2): qty 1
  Filled 2015-08-13: qty 2
  Filled 2015-08-13: qty 1

## 2015-08-13 MED ORDER — DOCUSATE SODIUM 100 MG PO CAPS
200.0000 mg | ORAL_CAPSULE | Freq: Every day | ORAL | Status: DC
  Administered 2015-08-14 – 2015-08-15 (×2): 200 mg via ORAL
  Filled 2015-08-13 (×2): qty 2

## 2015-08-13 MED ORDER — SODIUM CHLORIDE 0.9 % IJ SOLN
INTRAMUSCULAR | Status: AC
  Filled 2015-08-13: qty 10

## 2015-08-13 MED ORDER — POTASSIUM CHLORIDE 10 MEQ/50ML IV SOLN: 10.0000 meq | INTRAVENOUS | Status: AC

## 2015-08-13 MED ORDER — INSULIN REGULAR HUMAN 100 UNIT/ML IJ SOLN
INTRAMUSCULAR | Status: DC
  Filled 2015-08-13: qty 2.5

## 2015-08-13 MED ORDER — MIDAZOLAM HCL 10 MG/2ML IJ SOLN
INTRAMUSCULAR | Status: AC
  Filled 2015-08-13: qty 4

## 2015-08-13 MED ORDER — SODIUM CHLORIDE 0.45 % IV SOLN
INTRAVENOUS | Status: DC | PRN
  Administered 2015-08-13: 13:00:00 via INTRAVENOUS

## 2015-08-13 MED ORDER — ACETAMINOPHEN 500 MG PO TABS
1000.0000 mg | ORAL_TABLET | Freq: Four times a day (QID) | ORAL | Status: DC
  Administered 2015-08-14 – 2015-08-15 (×6): 1000 mg via ORAL
  Filled 2015-08-13 (×7): qty 2

## 2015-08-13 MED ORDER — ROCURONIUM BROMIDE 50 MG/5ML IV SOLN
INTRAVENOUS | Status: AC
  Filled 2015-08-13: qty 2

## 2015-08-13 MED ORDER — THROMBIN 20000 UNITS EX SOLR
CUTANEOUS | Status: AC
  Filled 2015-08-13: qty 20000

## 2015-08-13 MED ORDER — EPHEDRINE SULFATE 50 MG/ML IJ SOLN
INTRAMUSCULAR | Status: AC
  Filled 2015-08-13: qty 1

## 2015-08-13 MED ORDER — NITROGLYCERIN IN D5W 200-5 MCG/ML-% IV SOLN: 0.0000 ug/min | INTRAVENOUS | Status: DC

## 2015-08-13 MED ORDER — SODIUM CHLORIDE 0.9% FLUSH: 3.0000 mL | INTRAVENOUS | Status: DC | PRN

## 2015-08-13 MED ORDER — HEMOSTATIC AGENTS (NO CHARGE) OPTIME
TOPICAL | Status: DC | PRN
  Administered 2015-08-13: 1 via TOPICAL

## 2015-08-13 MED ORDER — LIDOCAINE HCL (CARDIAC) 20 MG/ML IV SOLN
INTRAVENOUS | Status: DC | PRN
  Administered 2015-08-13: 60 mg via INTRAVENOUS

## 2015-08-13 MED ORDER — ALBUMIN HUMAN 5 % IV SOLN
250.0000 mL | INTRAVENOUS | Status: AC | PRN
  Administered 2015-08-13 (×3): 250 mL via INTRAVENOUS
  Filled 2015-08-13: qty 250

## 2015-08-13 MED ORDER — BISACODYL 10 MG RE SUPP
10.0000 mg | Freq: Every day | RECTAL | Status: DC
Start: 2015-08-14 — End: 2015-08-15

## 2015-08-13 MED ORDER — HEPARIN SODIUM (PORCINE) 1000 UNIT/ML IJ SOLN
INTRAMUSCULAR | Status: DC | PRN
  Administered 2015-08-13: 33000 [IU] via INTRAVENOUS

## 2015-08-13 MED ORDER — INSULIN REGULAR BOLUS VIA INFUSION
0.0000 [IU] | Freq: Three times a day (TID) | INTRAVENOUS | Status: DC
  Filled 2015-08-13: qty 10

## 2015-08-13 MED ORDER — SODIUM CHLORIDE 0.9% FLUSH
3.0000 mL | Freq: Two times a day (BID) | INTRAVENOUS | Status: DC
  Administered 2015-08-14 – 2015-08-15 (×3): 3 mL via INTRAVENOUS

## 2015-08-13 MED ORDER — CHLORHEXIDINE GLUCONATE 0.12% ORAL RINSE (MEDLINE KIT)
15.0000 mL | Freq: Two times a day (BID) | OROMUCOSAL | Status: DC
Start: 2015-08-13 — End: 2015-08-18
  Administered 2015-08-13 – 2015-08-18 (×9): 15 mL via OROMUCOSAL

## 2015-08-13 MED ORDER — LACTATED RINGERS IV SOLN: 500.0000 mL | Freq: Once | INTRAVENOUS | Status: DC | PRN

## 2015-08-13 MED ORDER — METOPROLOL TARTRATE 25 MG/10 ML ORAL SUSPENSION: 12.5000 mg | Freq: Two times a day (BID) | ORAL | Status: DC

## 2015-08-13 MED ORDER — METOPROLOL TARTRATE 1 MG/ML IV SOLN
2.5000 mg | INTRAVENOUS | Status: DC | PRN
  Filled 2015-08-13: qty 5

## 2015-08-13 MED ORDER — BISACODYL 5 MG PO TBEC
10.0000 mg | DELAYED_RELEASE_TABLET | Freq: Every day | ORAL | Status: DC
  Administered 2015-08-14 – 2015-08-15 (×2): 10 mg via ORAL
  Filled 2015-08-13 (×2): qty 2

## 2015-08-13 MED ORDER — SODIUM CHLORIDE 0.9 % IV SOLN
250.0000 [IU] | INTRAVENOUS | Status: DC | PRN
  Administered 2015-08-13: 2.1 [IU]/h via INTRAVENOUS

## 2015-08-13 MED ORDER — PHENYLEPHRINE HCL 10 MG/ML IJ SOLN
0.0000 ug/min | INTRAVENOUS | Status: DC
  Filled 2015-08-13: qty 2

## 2015-08-13 MED ORDER — PROPOFOL 10 MG/ML IV BOLUS
INTRAVENOUS | Status: DC | PRN
  Administered 2015-08-13: 50 mg via INTRAVENOUS

## 2015-08-13 MED ORDER — SODIUM CHLORIDE 0.9 % IV SOLN: 250.0000 mL | INTRAVENOUS | Status: DC

## 2015-08-13 MED ORDER — CHLORHEXIDINE GLUCONATE 0.12 % MT SOLN
15.0000 mL | OROMUCOSAL | Status: AC
  Administered 2015-08-13: 15 mL via OROMUCOSAL

## 2015-08-13 MED ORDER — LACTATED RINGERS IV SOLN
INTRAVENOUS | Status: DC
  Administered 2015-08-13: 23:00:00 via INTRAVENOUS

## 2015-08-13 MED ORDER — PANTOPRAZOLE SODIUM 40 MG PO TBEC
40.0000 mg | DELAYED_RELEASE_TABLET | Freq: Every day | ORAL | Status: DC
  Administered 2015-08-15: 40 mg via ORAL
  Filled 2015-08-13: qty 1

## 2015-08-13 MED ORDER — SODIUM CHLORIDE 0.9 % IV SOLN
INTRAVENOUS | Status: DC | PRN
  Administered 2015-08-13: 12:00:00 via INTRAVENOUS

## 2015-08-13 MED ORDER — VANCOMYCIN HCL IN DEXTROSE 1-5 GM/200ML-% IV SOLN
1000.0000 mg | Freq: Once | INTRAVENOUS | Status: AC
  Administered 2015-08-13: 1000 mg via INTRAVENOUS
  Filled 2015-08-13: qty 200

## 2015-08-13 MED ORDER — SIMVASTATIN 20 MG PO TABS
20.0000 mg | ORAL_TABLET | Freq: Every day | ORAL | Status: DC
Start: 2015-08-14 — End: 2015-08-18
  Administered 2015-08-14 – 2015-08-18 (×5): 20 mg via ORAL
  Filled 2015-08-13 (×5): qty 1

## 2015-08-13 MED ORDER — METOPROLOL TARTRATE 12.5 MG HALF TABLET
12.5000 mg | ORAL_TABLET | Freq: Two times a day (BID) | ORAL | Status: DC
  Administered 2015-08-14 – 2015-08-15 (×3): 12.5 mg via ORAL
  Filled 2015-08-13 (×3): qty 1

## 2015-08-13 MED ORDER — SODIUM CHLORIDE 0.9 % IV SOLN
INTRAVENOUS | Status: DC
  Administered 2015-08-13: 13:00:00 via INTRAVENOUS

## 2015-08-13 MED ORDER — ARTIFICIAL TEARS OP OINT
TOPICAL_OINTMENT | OPHTHALMIC | Status: DC | PRN
  Administered 2015-08-13: 1 via OPHTHALMIC

## 2015-08-13 MED ORDER — PHENYLEPHRINE HCL 10 MG/ML IJ SOLN
10.0000 mg | INTRAVENOUS | Status: DC | PRN
  Administered 2015-08-13: 20 ug/min via INTRAVENOUS

## 2015-08-13 MED ORDER — ANTISEPTIC ORAL RINSE SOLUTION (CORINZ)
7.0000 mL | OROMUCOSAL | Status: DC
  Administered 2015-08-13 – 2015-08-18 (×19): 7 mL via OROMUCOSAL

## 2015-08-13 MED ORDER — PROPOFOL 10 MG/ML IV BOLUS
INTRAVENOUS | Status: AC
  Filled 2015-08-13: qty 20

## 2015-08-13 MED ORDER — LACTATED RINGERS IV SOLN
INTRAVENOUS | Status: DC
  Administered 2015-08-13 (×2): via INTRAVENOUS

## 2015-08-13 MED ORDER — ASPIRIN 81 MG PO CHEW: 324.0000 mg | CHEWABLE_TABLET | Freq: Every day | ORAL | Status: DC

## 2015-08-13 MED ORDER — MIDAZOLAM HCL 2 MG/2ML IJ SOLN: 2.0000 mg | INTRAMUSCULAR | Status: DC | PRN

## 2015-08-13 MED ORDER — TRAMADOL HCL 50 MG PO TABS: 50.0000 mg | ORAL_TABLET | ORAL | Status: DC | PRN

## 2015-08-13 MED ORDER — FAMOTIDINE IN NACL 20-0.9 MG/50ML-% IV SOLN
20.0000 mg | Freq: Two times a day (BID) | INTRAVENOUS | Status: AC
  Administered 2015-08-13: 20 mg via INTRAVENOUS

## 2015-08-13 MED ORDER — CHLORHEXIDINE GLUCONATE 0.12 % MT SOLN
15.0000 mL | Freq: Once | OROMUCOSAL | Status: AC
Start: 2015-08-14 — End: 2015-08-13
  Administered 2015-08-13: 15 mL via OROMUCOSAL
  Filled 2015-08-13: qty 15

## 2015-08-13 MED ORDER — MORPHINE SULFATE (PF) 2 MG/ML IV SOLN
1.0000 mg | INTRAVENOUS | Status: AC | PRN
  Administered 2015-08-13: 2 mg via INTRAVENOUS

## 2015-08-13 MED ORDER — ACETAMINOPHEN 160 MG/5ML PO SOLN: 1000.0000 mg | Freq: Four times a day (QID) | ORAL | Status: DC

## 2015-08-13 MED ORDER — MAGNESIUM SULFATE 4 GM/100ML IV SOLN
4.0000 g | Freq: Once | INTRAVENOUS | Status: AC
  Administered 2015-08-13: 4 g via INTRAVENOUS
  Filled 2015-08-13: qty 100

## 2015-08-13 MED ORDER — PROTAMINE SULFATE 10 MG/ML IV SOLN
INTRAVENOUS | Status: DC | PRN
  Administered 2015-08-13: 330 mg via INTRAVENOUS

## 2015-08-13 MED ORDER — HEPARIN SODIUM (PORCINE) 1000 UNIT/ML IJ SOLN
INTRAMUSCULAR | Status: AC
  Filled 2015-08-13: qty 1

## 2015-08-13 MED ORDER — METOPROLOL TARTRATE 12.5 MG HALF TABLET: 12.5000 mg | ORAL_TABLET | Freq: Once | ORAL | Status: DC

## 2015-08-13 MED ORDER — PHENYLEPHRINE HCL 10 MG/ML IJ SOLN
INTRAMUSCULAR | Status: DC | PRN
  Administered 2015-08-13: 40 ug via INTRAVENOUS

## 2015-08-13 MED ORDER — ACETAMINOPHEN 160 MG/5ML PO SOLN: 650.0000 mg | Freq: Once | ORAL | Status: AC

## 2015-08-13 MED ORDER — DEXTROSE 5 % IV SOLN
1.5000 g | Freq: Two times a day (BID) | INTRAVENOUS | Status: AC
  Administered 2015-08-13 – 2015-08-15 (×4): 1.5 g via INTRAVENOUS
  Filled 2015-08-13 (×4): qty 1.5

## 2015-08-13 MED ORDER — THROMBIN 20000 UNITS EX SOLR
OROMUCOSAL | Status: DC | PRN
  Administered 2015-08-13: 12 mL via TOPICAL

## 2015-08-13 MED ORDER — ONDANSETRON HCL 4 MG/2ML IJ SOLN
4.0000 mg | Freq: Four times a day (QID) | INTRAMUSCULAR | Status: DC | PRN
  Administered 2015-08-13 – 2015-08-15 (×3): 4 mg via INTRAVENOUS
  Filled 2015-08-13 (×3): qty 2

## 2015-08-13 MED ORDER — PHENYLEPHRINE 40 MCG/ML (10ML) SYRINGE FOR IV PUSH (FOR BLOOD PRESSURE SUPPORT)
PREFILLED_SYRINGE | INTRAVENOUS | Status: AC
  Filled 2015-08-13: qty 10

## 2015-08-13 MED ORDER — FENTANYL CITRATE (PF) 100 MCG/2ML IJ SOLN
INTRAMUSCULAR | Status: DC | PRN
  Administered 2015-08-13: 250 ug via INTRAVENOUS
  Administered 2015-08-13 (×4): 50 ug via INTRAVENOUS
  Administered 2015-08-13 (×2): 150 ug via INTRAVENOUS
  Administered 2015-08-13: 50 ug via INTRAVENOUS
  Administered 2015-08-13 (×3): 250 ug via INTRAVENOUS
  Administered 2015-08-13: 50 ug via INTRAVENOUS
  Administered 2015-08-13: 100 ug via INTRAVENOUS
  Administered 2015-08-13: 50 ug via INTRAVENOUS

## 2015-08-13 MED ORDER — LACTATED RINGERS IV SOLN
INTRAVENOUS | Status: DC | PRN
  Administered 2015-08-13: 07:00:00 via INTRAVENOUS

## 2015-08-13 MED ORDER — ALBUMIN HUMAN 5 % IV SOLN
INTRAVENOUS | Status: DC | PRN
  Administered 2015-08-13: 11:00:00 via INTRAVENOUS

## 2015-08-13 MED ORDER — FENTANYL CITRATE (PF) 250 MCG/5ML IJ SOLN
INTRAMUSCULAR | Status: AC
  Filled 2015-08-13: qty 5

## 2015-08-13 MED ORDER — CHLORHEXIDINE GLUCONATE 4 % EX LIQD: 30.0000 mL | CUTANEOUS | Status: DC

## 2015-08-13 MED ORDER — ROCURONIUM BROMIDE 100 MG/10ML IV SOLN
INTRAVENOUS | Status: DC | PRN
  Administered 2015-08-13 (×4): 50 mg via INTRAVENOUS

## 2015-08-13 MED ORDER — ASPIRIN EC 325 MG PO TBEC
325.0000 mg | DELAYED_RELEASE_TABLET | Freq: Every day | ORAL | Status: DC
  Administered 2015-08-14 – 2015-08-15 (×2): 325 mg via ORAL
  Filled 2015-08-13 (×2): qty 1

## 2015-08-13 MED ORDER — FENTANYL CITRATE (PF) 250 MCG/5ML IJ SOLN
INTRAMUSCULAR | Status: AC
  Filled 2015-08-13: qty 30

## 2015-08-13 MED ORDER — 0.9 % SODIUM CHLORIDE (POUR BTL) OPTIME
TOPICAL | Status: DC | PRN
  Administered 2015-08-13: 5000 mL

## 2015-08-13 MED ORDER — MIDAZOLAM HCL 5 MG/5ML IJ SOLN
INTRAMUSCULAR | Status: DC | PRN
  Administered 2015-08-13: 5 mg via INTRAVENOUS
  Administered 2015-08-13: 4 mg via INTRAVENOUS
  Administered 2015-08-13: 5 mg via INTRAVENOUS
  Administered 2015-08-13: 4 mg via INTRAVENOUS
  Administered 2015-08-13: 2 mg via INTRAVENOUS

## 2015-08-13 MED ORDER — ACETAMINOPHEN 10 MG/ML IV SOLN
1000.0000 mg | Freq: Once | INTRAVENOUS | Status: AC
  Administered 2015-08-13: 1000 mg via INTRAVENOUS
  Filled 2015-08-13: qty 100

## 2015-08-13 MED ORDER — ACETAMINOPHEN 650 MG RE SUPP
650.0000 mg | Freq: Once | RECTAL | Status: AC
  Administered 2015-08-13: 650 mg via RECTAL

## 2015-08-13 MED FILL — Potassium Chloride Inj 2 mEq/ML: INTRAVENOUS | Qty: 40 | Status: AC

## 2015-08-13 MED FILL — Heparin Sodium (Porcine) Inj 1000 Unit/ML: INTRAMUSCULAR | Qty: 30 | Status: AC

## 2015-08-13 MED FILL — Mannitol IV Soln 20%: INTRAVENOUS | Qty: 500 | Status: AC

## 2015-08-13 MED FILL — Heparin Sodium (Porcine) Inj 1000 Unit/ML: INTRAMUSCULAR | Qty: 10 | Status: AC

## 2015-08-13 MED FILL — Electrolyte-R (PH 7.4) Solution: INTRAVENOUS | Qty: 5000 | Status: AC

## 2015-08-13 MED FILL — Magnesium Sulfate Inj 50%: INTRAMUSCULAR | Qty: 10 | Status: AC

## 2015-08-13 MED FILL — Sodium Bicarbonate IV Soln 8.4%: INTRAVENOUS | Qty: 50 | Status: AC

## 2015-08-13 MED FILL — Lidocaine HCl IV Inj 20 MG/ML: INTRAVENOUS | Qty: 5 | Status: AC

## 2015-08-13 SURGICAL SUPPLY — 104 items
BAG DECANTER FOR FLEXI CONT (MISCELLANEOUS) ×4 IMPLANT
BANDAGE ACE 4X5 VEL STRL LF (GAUZE/BANDAGES/DRESSINGS) ×2 IMPLANT
BANDAGE ACE 6X5 VEL STRL LF (GAUZE/BANDAGES/DRESSINGS) ×2 IMPLANT
BANDAGE ELASTIC 4 VELCRO ST LF (GAUZE/BANDAGES/DRESSINGS) ×4 IMPLANT
BANDAGE ELASTIC 6 VELCRO ST LF (GAUZE/BANDAGES/DRESSINGS) ×4 IMPLANT
BASKET HEART  (ORDER IN 25'S) (MISCELLANEOUS) ×1
BASKET HEART (ORDER IN 25'S) (MISCELLANEOUS) ×1
BASKET HEART (ORDER IN 25S) (MISCELLANEOUS) ×2 IMPLANT
BLADE 10 SAFETY STRL DISP (BLADE) ×2 IMPLANT
BLADE STERNUM SYSTEM 6 (BLADE) ×4 IMPLANT
BNDG GAUZE ELAST 4 BULKY (GAUZE/BANDAGES/DRESSINGS) ×4 IMPLANT
CANISTER SUCTION 2500CC (MISCELLANEOUS) ×4 IMPLANT
CATH ROBINSON RED A/P 18FR (CATHETERS) ×8 IMPLANT
CATH THORACIC 28FR (CATHETERS) ×4 IMPLANT
CATH THORACIC 36FR (CATHETERS) ×4 IMPLANT
CATH THORACIC 36FR RT ANG (CATHETERS) ×4 IMPLANT
CLIP TI MEDIUM 24 (CLIP) IMPLANT
CLIP TI WIDE RED SMALL 24 (CLIP) ×2 IMPLANT
COVER SURGICAL LIGHT HANDLE (MISCELLANEOUS) ×4 IMPLANT
CRADLE DONUT ADULT HEAD (MISCELLANEOUS) ×4 IMPLANT
DRAPE CARDIOVASCULAR INCISE (DRAPES) ×4
DRAPE SLUSH/WARMER DISC (DRAPES) ×4 IMPLANT
DRAPE SRG 135X102X78XABS (DRAPES) ×2 IMPLANT
DRSG AQUACEL AG ADV 3.5X14 (GAUZE/BANDAGES/DRESSINGS) ×2 IMPLANT
DRSG COVADERM 4X14 (GAUZE/BANDAGES/DRESSINGS) ×4 IMPLANT
ELECT CAUTERY BLADE 6.4 (BLADE) ×4 IMPLANT
ELECT REM PT RETURN 9FT ADLT (ELECTROSURGICAL) ×8
ELECTRODE REM PT RTRN 9FT ADLT (ELECTROSURGICAL) ×4 IMPLANT
FELT TEFLON 1X6 (MISCELLANEOUS) ×4 IMPLANT
GAUZE SPONGE 4X4 12PLY STRL (GAUZE/BANDAGES/DRESSINGS) ×8 IMPLANT
GLOVE BIO SURGEON STRL SZ 6 (GLOVE) IMPLANT
GLOVE BIO SURGEON STRL SZ 6.5 (GLOVE) IMPLANT
GLOVE BIO SURGEON STRL SZ7 (GLOVE) ×2 IMPLANT
GLOVE BIO SURGEON STRL SZ7.5 (GLOVE) IMPLANT
GLOVE BIO SURGEONS STRL SZ 6.5 (GLOVE)
GLOVE BIOGEL PI IND STRL 6 (GLOVE) IMPLANT
GLOVE BIOGEL PI IND STRL 6.5 (GLOVE) IMPLANT
GLOVE BIOGEL PI IND STRL 7.0 (GLOVE) IMPLANT
GLOVE BIOGEL PI INDICATOR 6 (GLOVE) ×2
GLOVE BIOGEL PI INDICATOR 6.5 (GLOVE)
GLOVE BIOGEL PI INDICATOR 7.0 (GLOVE) ×2
GLOVE EUDERMIC 7 POWDERFREE (GLOVE) ×8 IMPLANT
GLOVE ORTHO TXT STRL SZ7.5 (GLOVE) IMPLANT
GOWN STRL REUS W/ TWL LRG LVL3 (GOWN DISPOSABLE) ×8 IMPLANT
GOWN STRL REUS W/ TWL XL LVL3 (GOWN DISPOSABLE) ×2 IMPLANT
GOWN STRL REUS W/TWL LRG LVL3 (GOWN DISPOSABLE) ×16
GOWN STRL REUS W/TWL XL LVL3 (GOWN DISPOSABLE) ×4
HEMOSTAT POWDER SURGIFOAM 1G (HEMOSTASIS) ×12 IMPLANT
HEMOSTAT SURGICEL 2X14 (HEMOSTASIS) ×4 IMPLANT
INSERT FOGARTY 61MM (MISCELLANEOUS) IMPLANT
INSERT FOGARTY XLG (MISCELLANEOUS) ×2 IMPLANT
KIT BASIN OR (CUSTOM PROCEDURE TRAY) ×4 IMPLANT
KIT CATH CPB BARTLE (MISCELLANEOUS) ×4 IMPLANT
KIT ROOM TURNOVER OR (KITS) ×4 IMPLANT
KIT SUCTION CATH 14FR (SUCTIONS) ×4 IMPLANT
KIT VASOVIEW HEMOPRO VH 3000 (KITS) ×4 IMPLANT
NS IRRIG 1000ML POUR BTL (IV SOLUTION) ×20 IMPLANT
PACK OPEN HEART (CUSTOM PROCEDURE TRAY) ×4 IMPLANT
PAD ARMBOARD 7.5X6 YLW CONV (MISCELLANEOUS) ×8 IMPLANT
PAD ELECT DEFIB RADIOL ZOLL (MISCELLANEOUS) ×4 IMPLANT
PENCIL BUTTON HOLSTER BLD 10FT (ELECTRODE) ×4 IMPLANT
PUNCH AORTIC ROTATE 4.0MM (MISCELLANEOUS) IMPLANT
PUNCH AORTIC ROTATE 4.5MM 8IN (MISCELLANEOUS) ×4 IMPLANT
PUNCH AORTIC ROTATE 5MM 8IN (MISCELLANEOUS) IMPLANT
SET CARDIOPLEGIA MPS 5001102 (MISCELLANEOUS) ×2 IMPLANT
SOLUTION ANTI FOG 6CC (MISCELLANEOUS) ×2 IMPLANT
SPONGE INTESTINAL PEANUT (DISPOSABLE) IMPLANT
SPONGE LAP 18X18 X RAY DECT (DISPOSABLE) IMPLANT
SPONGE LAP 4X18 X RAY DECT (DISPOSABLE) ×4 IMPLANT
SUT BONE WAX W31G (SUTURE) ×4 IMPLANT
SUT MNCRL AB 4-0 PS2 18 (SUTURE) IMPLANT
SUT PROLENE 3 0 SH DA (SUTURE) IMPLANT
SUT PROLENE 3 0 SH1 36 (SUTURE) ×4 IMPLANT
SUT PROLENE 4 0 RB 1 (SUTURE)
SUT PROLENE 4 0 SH DA (SUTURE) IMPLANT
SUT PROLENE 4-0 RB1 .5 CRCL 36 (SUTURE) IMPLANT
SUT PROLENE 5 0 C 1 36 (SUTURE) IMPLANT
SUT PROLENE 6 0 C 1 30 (SUTURE) IMPLANT
SUT PROLENE 7 0 BV 1 (SUTURE) IMPLANT
SUT PROLENE 7 0 BV1 MDA (SUTURE) ×6 IMPLANT
SUT PROLENE 8 0 BV175 6 (SUTURE) IMPLANT
SUT SILK  1 MH (SUTURE)
SUT SILK 1 MH (SUTURE) IMPLANT
SUT STEEL STERNAL CCS#1 18IN (SUTURE) IMPLANT
SUT STEEL SZ 6 DBL 3X14 BALL (SUTURE) IMPLANT
SUT VIC AB 1 CTX 36 (SUTURE) ×8
SUT VIC AB 1 CTX36XBRD ANBCTR (SUTURE) ×4 IMPLANT
SUT VIC AB 2-0 CT1 27 (SUTURE) ×4
SUT VIC AB 2-0 CT1 TAPERPNT 27 (SUTURE) IMPLANT
SUT VIC AB 2-0 CTX 27 (SUTURE) IMPLANT
SUT VIC AB 3-0 SH 27 (SUTURE)
SUT VIC AB 3-0 SH 27X BRD (SUTURE) IMPLANT
SUT VIC AB 3-0 X1 27 (SUTURE) ×2 IMPLANT
SUT VICRYL 4-0 PS2 18IN ABS (SUTURE) IMPLANT
SUTURE E-PAK OPEN HEART (SUTURE) ×4 IMPLANT
SYSTEM SAHARA CHEST DRAIN ATS (WOUND CARE) ×4 IMPLANT
TAPE CLOTH SURG 6X10 WHT LF (GAUZE/BANDAGES/DRESSINGS) ×2 IMPLANT
TAPE PAPER 2X10 WHT MICROPORE (GAUZE/BANDAGES/DRESSINGS) ×2 IMPLANT
TOWEL OR 17X24 6PK STRL BLUE (TOWEL DISPOSABLE) ×4 IMPLANT
TOWEL OR 17X26 10 PK STRL BLUE (TOWEL DISPOSABLE) ×4 IMPLANT
TRAY FOLEY IC TEMP SENS 16FR (CATHETERS) ×4 IMPLANT
TUBING INSUFFLATION (TUBING) ×4 IMPLANT
UNDERPAD 30X30 INCONTINENT (UNDERPADS AND DIAPERS) ×4 IMPLANT
WATER STERILE IRR 1000ML POUR (IV SOLUTION) ×8 IMPLANT

## 2015-08-13 NOTE — Anesthesia Procedure Notes (Signed)
Procedure Name: Intubation Date/Time: 08/13/2015 7:31 AM Performed by: Lance Coon Pre-anesthesia Checklist: Patient identified, Timeout performed, Emergency Drugs available, Suction available and Patient being monitored Patient Re-evaluated:Patient Re-evaluated prior to inductionOxygen Delivery Method: Circle system utilized Preoxygenation: Pre-oxygenation with 100% oxygen Intubation Type: IV induction Ventilation: Oral airway inserted - appropriate to patient size and Mask ventilation without difficulty Laryngoscope Size: Miller and 2 Grade View: Grade II Tube type: Oral Tube size: 8.0 mm Number of attempts: 1 Airway Equipment and Method: Stylet Secured at: 22 cm Tube secured with: Tape Dental Injury: Teeth and Oropharynx as per pre-operative assessment

## 2015-08-13 NOTE — Progress Notes (Signed)
Patient not passing rapid wean trials due to tachypnea and low tidal volumes.  ABG sat at 90%. RN has contacted MD to make him aware. RT has returned patient back to full support and will continue to monitor.

## 2015-08-13 NOTE — Brief Op Note (Signed)
08/13/2015  10:56 AM      [address]       [place] [address]             [phone]     08/13/2015  10:56 AM  PATIENT:  [name] [name]  70 y.o. male  PRE-OPERATIVE DIAGNOSIS:  CAD  POST-OPERATIVE DIAGNOSIS:  CAD  PROCEDURE:  Procedure(s): CORONARY ARTERY BYPASS GRAFTING (CABG)X4 LIMA-LAD; SEQ SVG-PD-PL; SVG-OM EVH RIGHT GREATER SAPHENOUS VEIN TRANSESOPHAGEAL ECHOCARDIOGRAM (TEE)  SURGEON:  Surgeon(s): Gaye Pollack, MD  PHYSICIAN ASSISTANT: Aylah Yeary PA-C  ANESTHESIA:   general  PATIENT CONDITION:  ICU - intubated and hemodynamically stable.  PRE-OPERATIVE WEIGHT: [id number]  COMPLICATIONS: NO KNOWN

## 2015-08-13 NOTE — Progress Notes (Signed)
*  PRELIMINARY RESULTS* Echocardiogram Echocardiogram Transesophageal has been performed.  Leavy Cella 08/13/2015, 8:44 AM

## 2015-08-13 NOTE — Procedures (Signed)
Extubation Procedure Note  Patient Details:   Name: [name] [name] DOB: 11-10-1945 MRN: [id number]   Airway Documentation:  Airway 8 mm (Active)  Secured at (cm) 21 cm 08/13/2015  8:20 PM  Measured From Lips 08/13/2015  8:20 PM  [place] 08/13/2015  8:20 PM  Secured By [employer] 08/13/2015  8:20 PM  Site Condition Dry 08/13/2015  8:20 PM    Evaluation  O2 sats: stable throughout Complications: No apparent complications Patient did tolerate procedure well. Bilateral Breath Sounds: Clear   Yes  Chriss Driver [facility] 08/13/2015, 10:41 PM

## 2015-08-13 NOTE — Progress Notes (Signed)
VC 2.5, NIF -30. Pt extubated to a 4lt [place]. IS 950 with good effort. Pt stable throughout.

## 2015-08-13 NOTE — Op Note (Signed)
CARDIOVASCULAR SURGERY OPERATIVE NOTE  08/13/2015  Surgeon:  Gaye Pollack, MD  First Assistant: Jadene Pierini,  PA-C   Preoperative Diagnosis:  Severe multi-vessel coronary artery disease   Postoperative Diagnosis:  Same   Procedure:  1. Median Sternotomy 2. Extracorporeal circulation 3.   Coronary artery bypass grafting x 4   Left internal mammary graft to the LAD  Sequential SVG to PDA and PL1  SVG to OM  4.   Endoscopic vein harvest from the right leg   Anesthesia:  General Endotracheal   Clinical History/Surgical Indication:   The patient is a 70 year old gentleman with a history of DM, hyperlipidemia, hypertension and a family history of premature coronary disease with his father dying at 81 from MI who began having shortness of breath, left arm dull aching pain and diaphoresis in December. He had a couple episodes in Dec and Jan and then a few more in Feb and March. His wife reports that he has been fatigued. He had a high risk myoview and catheterization showed severe multivessel CAD with a 90% proximal LAD, 100% proximal LCX and 99% proximal to mid RCA. Echo showed an EF of 60-65% with no significant valvular dysfunction. He denies any symptoms in about 4 weeks.   I have personally reviewed his echo and cath films and have discussed the results with him and his wife.  He has severe multi-vessel coronary artery disease with a high risk nuclear stress test and stable exertional angina. I agree that CABG is the best treatment for this diabetic gentleman with tight coronary stenoses. I discussed the operative procedure with the patient and family including alternatives, benefits and risks; including but not limited to bleeding, blood transfusion, infection, stroke, myocardial infarction, graft failure, heart block requiring a permanent pacemaker, organ dysfunction, and death.  [name] understands and agrees to proceed.   Preparation:  The patient was seen in the preoperative holding area and the correct patient, correct operation were confirmed with the patient after reviewing the medical record and catheterization. The consent was signed by me. Preoperative antibiotics were given. A pulmonary arterial line and radial arterial line were placed by the anesthesia team. The patient was taken back to the operating room and positioned supine on the operating room table. After being placed under general endotracheal anesthesia by the anesthesia team a foley catheter was placed. The neck, chest, abdomen, and both legs were prepped with betadine soap and solution and draped in the usual sterile manner. A surgical time-out was taken and the correct patient and operative procedure were confirmed with the nursing and anesthesia staff.   Cardiopulmonary Bypass:  A median sternotomy was performed. The pericardium was opened in the midline. Right ventricular function appeared normal. The ascending aorta was of normal size and had no palpable plaque. There were no contraindications to aortic cannulation or cross-clamping. The patient was fully systemically heparinized and the ACT was maintained > 400 sec. The proximal aortic arch was cannulated with a 20 F aortic cannula for arterial inflow. Venous cannulation was performed via the right atrial appendage using a two-staged venous cannula. An antegrade cardioplegia/vent cannula was inserted into the mid-ascending aorta. Aortic occlusion was performed with a single cross-clamp. Systemic cooling to 32 degrees Centigrade and topical cooling of the heart with iced saline were used. Hyperkalemic antegrade cold blood cardioplegia was used to induce diastolic arrest and was then given at about 20 minute intervals throughout the period of arrest to maintain myocardial temperature at  or below 10 degrees centigrade. A temperature probe was inserted  into the interventricular septum and an insulating pad was placed in the pericardium.   Left internal mammary harvest:  The left side of the sternum was retracted using the Rultract retractor. The left internal mammary artery was harvested as a pedicle graft. All side branches were clipped. It was a small-sized vessel of good quality with excellent blood flow. It was ligated distally and divided. It was sprayed with topical papaverine solution to prevent vasospasm.   Endoscopic vein harvest:  The right greater saphenous vein was harvested endoscopically through a 2 cm incision medial to the right knee. It was harvested from the upper thigh to below the knee. It was a medium-sized vein of good quality. The side branches were all ligated with 4-0 silk ties.    Coronary arteries:  The coronary arteries were examined.   LAD:  Large vessel with mild proximal disease only  LCX:  OM was a moderate sized vessel with no distal disease  RCA:  Distal RCA was heavily diseased extending up to the takeoff of the PDA and just distal to the PDA. The PDA was small but graftable proximally. The two PL branches were moderate sized vessels with no distal disease   Grafts:  1. LIMA to the LAD: 2.5 mm. It was sewn end to side using 8-0 prolene continuous suture. 2. SVG to OM:  1.6 mm. It was sewn end to side using 7-0 prolene continuous suture. 3. Sequential SVG to PDA:  1.5 mm. It was sewn sequential side to side using 7-0 prolene continuous suture. 4. Sequential SVG to PL1:  1.6 mm. It was sewn sequential end to side using 7-0 prolene continuous suture.  The proximal vein graft anastomoses were performed to the mid-ascending aorta using continuous 6-0 prolene suture. Graft markers were placed around the proximal anastomoses.   Completion:  The patient was rewarmed to 37 degrees Centigrade. The clamp was removed from the LIMA pedicle and there was rapid warming of the septum and return of ventricular  fibrillation. The crossclamp was removed with a time of 80 minutes. There was spontaneous return of sinus rhythm. The distal and proximal anastomoses were checked for hemostasis. The position of the grafts was satisfactory. Two temporary epicardial pacing wires were placed on the right atrium and two on the right ventricle. The patient was weaned from CPB without difficulty on no inotropes. CPB time was 97 minutes. Cardiac output was 7 LPM. Heparin was fully reversed with protamine and the aortic and venous cannulas removed. Hemostasis was achieved. Mediastinal and left pleural drainage tubes were placed. The sternum was closed with double #6 stainless steel wires. The fascia was closed with continuous # 1 vicryl suture. The subcutaneous tissue was closed with 2-0 vicryl continuous suture. The skin was closed with 3-0 vicryl subcuticular suture. All sponge, needle, and instrument counts were reported correct at the end of the case. Dry sterile dressings were placed over the incisions and around the chest tubes which were connected to pleurevac suction. The patient was then transported to the surgical intensive care unit in critical but stable condition.

## 2015-08-13 NOTE — CV Procedure (Signed)
Intra-operative Transesophageal Echocardiography Report:  [name] [name] is a 70 year old male with type 2 diabetes, hyperlipidemia,  hypertension, and a family history of coronary artery disease who presented with a 4 month history of intermittent shortness of breath and left arm pain. A Myoview was read as high risk and a cardiac catheterization revealed severe multivessel coronary artery disease with 90% proximal LAD stenosis, 100% proximal left circumflex stenosis, and 99% proximal to mid RCA stenosis.  Transthoracic echocardiography revealed a 60-65% ejection fraction.  The patient was then scheduled to undergo coronary artery bypass grafting by Dr. Cyndia Bent. Intraoperative transesophageal echocardiography was indicated to evaluate the right and left ventricular function, to serve as a monitor for intraoperative volume status, and to assess for any other valvular pathology.  The patient was brought to the operating room at [facility] and general anesthesia was induced without difficulty. Following  endotracheal intubation and orogastric suctioning, the transesophageal echocardiography probe was inserted into the esophagus without difficulty.  Impression: Pre-bypass findings:  1. Aortic valve:  The aortic valve leaflets were moderately thickened.  The leaflets opened normally without restriction. There was no aortic insufficiency.  2. Mitral valve: There was mild to moderate mitral annular calcification. The  mitral leaflets appeared to open normally without restriction. The leaflets coapted well without prolapsing or flail segments. There was trace mitral insufficiency.  3. Left ventricle: The transgastric views of the left ventricle were somewhat difficult due to the patient's heart orientation and anatomy. There appeared to be adequate left ventricular systolic function with the ejection fraction estimated at 55-60%. There appeared to be left ventricular contractile dyssynchrony due to the  left bundle branch block. There was moderate left ventricular hypertrophy which was concentric. Left ventricular wall thickness measured 1.25 cm at end diastole at the inferior wall in the transgastric short axis view and measured 1.20 cm at the anterior wall at end diastole in the transgastric short axis view. Left ventricular end-diastolic diameter measured 39 mm.  4. Right ventricle: The right ventricular cavity was normal in size. There was normal contractility of the right ventricular free wall and normal appearing right ventricular systolic function.  5. Tricuspid valve: The tricuspid valve appeared structurally normal. There was trace tricuspid insufficiency.  6. Interatrial septum: The interatrial septum was intact without evidence of patent foramen ovale or atrial septal defect by color Doppler and bubble study.  7. Left atrium: There was no thrombus noted within the left atrial cavity or left atrial appendage.  8. Ascending aorta: There was a well-defined aortic root and sinotubular ridge. There was moderate thickening of the wall of the ascending aorta but no protruding atheromata noted.  The diameter of the ascending aorta was within normal limits and measured 34 mm at the sinuses of Valsalva. the sinotubular ridge measured 28 mm, and the proximal ascending aorta measured 27.6 mm. The aortic annulus measured 20 mm.  9. Descending aorta: The descending aorta showed no significant atheromatous disease and measured 23 mm.  Post-bypass findings:  1.Aortic valve: The aortic valve was unchanged from the pre-bypass study. The leaflets opened without restriction and there was no aortic insufficiency.  2. Mitral valve: The mitral leaflets appeared to open normally and there was trace mitral insufficiency.   3. Left ventricle: There appeared to be adequate contractility of the left ventricle with normal appearing systolic function. Ejection fraction was estimated at 60%. There were no regional  wall motion abnormalities.  4. Right ventricle: The right ventricular cavity was normal in size and  there was normal contractility of the right ventricular free wall and normal appearing right ventricular systolic function.  5. Tricuspid valve: The tricuspid valve appeared structurally normal with trace tricuspid insufficiency.   Roberts Gaudy, M.D.

## 2015-08-13 NOTE — Transfer of Care (Addendum)
Immediate Anesthesia Transfer of Care Note  Patient: [name] [name]  Procedure(s) Performed: Procedure(s): CORONARY ARTERY BYPASS GRAFTING (CABG) x 4 using left internal mammary artery and right leg saphenous vein (N/A) TRANSESOPHAGEAL ECHOCARDIOGRAM (TEE) (N/A)  Patient Location: SICU  Anesthesia Type:General  Level of Consciousness: sedated  Airway & Oxygen Therapy: Patient remains intubated per anesthesia plan and Patient placed on Ventilator (see vital sign flow sheet for setting)  Post-op Assessment: Report given to RN and Post -op Vital signs reviewed and stable  Post vital signs: Reviewed and stable  Last Vitals:  Filed Vitals:   08/13/15 0614 08/13/15 1242  BP: 188/84   Pulse: 68 80  Temp: 36.8 C   Resp: 20 12    Complications: No apparent anesthesia complications

## 2015-08-13 NOTE — Addendum Note (Signed)
Addendum  created 08/13/15 1927 by Roberts Gaudy, MD   Modules edited: Notes Section   Notes Section:  File: [id number]; Pend: [id number]; Raelyn Number: [id number]; Raelyn Number: [id number]; Raelyn Number: [id number] Number: [id number]; [place]: [id number]

## 2015-08-13 NOTE — Anesthesia Postprocedure Evaluation (Signed)
Anesthesia Post Note  Patient: [name] [name]  Procedure(s) Performed: Procedure(s) (LRB): CORONARY ARTERY BYPASS GRAFTING (CABG) x 4 using left internal mammary artery and right leg saphenous vein (N/A) TRANSESOPHAGEAL ECHOCARDIOGRAM (TEE) (N/A)  Patient location during evaluation: SICU Anesthesia Type: General Level of consciousness: patient remains intubated per anesthesia plan Pain management: pain level controlled Vital Signs Assessment: post-procedure vital signs reviewed and stable Respiratory status: patient remains intubated per anesthesia plan and patient on ventilator - see flowsheet for VS Anesthetic complications: no    Last Vitals:  Filed Vitals:   08/13/15 1400 08/13/15 1415  BP: 89/66 83/61  Pulse: 86 85  Temp: 36.2 C 36.4 C  Resp: 12 12    Last Pain: There were no vitals filed for this visit.               Issis Lindseth COKER

## 2015-08-13 NOTE — Progress Notes (Addendum)
Extubated at 2240 after pt passed mechanical and ABG parameters with weaning. No complications, pt able to cough and say name clearly. RRT educated use of IS, pt able to pull 1000 on it. Currently on 4L [place], will collect f/u ABG in an hour. Pt currently resting comfortably. Will update RRT after I get the f/u ABG results per his request.   Henreitta Leber, RN 10:44 PM 08/13/2015

## 2015-08-13 NOTE — Interval H&P Note (Signed)
History and Physical Interval Note:  08/13/2015 5:49 AM  [name] [name]  has presented today for surgery, with the diagnosis of CAD  The various methods of treatment have been discussed with the patient and family. After consideration of risks, benefits and other options for treatment, the patient has consented to  Procedure(s): CORONARY ARTERY BYPASS GRAFTING (CABG) (N/A) TRANSESOPHAGEAL ECHOCARDIOGRAM (TEE) (N/A) as a surgical intervention .  The patient's history has been reviewed, patient examined, no change in status, stable for surgery.  I have reviewed the patient's chart and labs.  Questions were answered to the patient's satisfaction.     Gaye Pollack

## 2015-08-13 NOTE — Progress Notes (Signed)
CT surgery p.m. Rounds  Patient hemodynamics stable making adequate urine Minimal chest tube drainage Remains sedated on ventilator but starting to wean and reduced dose of sedatives. Previous attempt at weaning resulted in tachypnea, poor mechanics and poor ABGs

## 2015-08-14 ENCOUNTER — Inpatient Hospital Stay (HOSPITAL_COMMUNITY): Payer: PPO

## 2015-08-14 ENCOUNTER — Encounter (HOSPITAL_COMMUNITY): Payer: Self-pay | Admitting: Surgery

## 2015-08-14 LAB — GLUCOSE, CAPILLARY
GLUCOSE-CAPILLARY: 123 mg/dL — AB (ref 65–99)
GLUCOSE-CAPILLARY: 118 mg/dL — AB (ref 65–99)
GLUCOSE-CAPILLARY: 118 mg/dL — AB (ref 65–99)
GLUCOSE-CAPILLARY: 128 mg/dL — AB (ref 65–99)
GLUCOSE-CAPILLARY: 116 mg/dL — AB (ref 65–99)
GLUCOSE-CAPILLARY: 174 mg/dL — AB (ref 65–99)
Glucose-Capillary: 111 mg/dL — ABNORMAL HIGH (ref 65–99)
Glucose-Capillary: 116 mg/dL — ABNORMAL HIGH (ref 65–99)
Glucose-Capillary: 118 mg/dL — ABNORMAL HIGH (ref 65–99)
Glucose-Capillary: 119 mg/dL — ABNORMAL HIGH (ref 65–99)
Glucose-Capillary: 135 mg/dL — ABNORMAL HIGH (ref 65–99)
Glucose-Capillary: 140 mg/dL — ABNORMAL HIGH (ref 65–99)
Glucose-Capillary: 144 mg/dL — ABNORMAL HIGH (ref 65–99)
Glucose-Capillary: 156 mg/dL — ABNORMAL HIGH (ref 65–99)
Glucose-Capillary: 176 mg/dL — ABNORMAL HIGH (ref 65–99)

## 2015-08-14 LAB — CBC
HCT: 30.7 % — ABNORMAL LOW (ref 39.0–52.0)
HEMATOCRIT: 33.8 % — AB (ref 39.0–52.0)
HEMOGLOBIN: 11.8 g/dL — AB (ref 13.0–17.0)
Hemoglobin: 10.6 g/dL — ABNORMAL LOW (ref 13.0–17.0)
MCH: 31.2 pg (ref 26.0–34.0)
MCH: 32.4 pg (ref 26.0–34.0)
MCHC: 34.5 g/dL (ref 30.0–36.0)
MCHC: 34.9 g/dL (ref 30.0–36.0)
MCV: 90.3 fL (ref 78.0–100.0)
MCV: 92.9 fL (ref 78.0–100.0)
PLATELETS: 127 10*3/uL — AB (ref 150–400)
Platelets: 146 10*3/uL — ABNORMAL LOW (ref 150–400)
RBC: 3.4 MIL/uL — AB (ref 4.22–5.81)
RBC: 3.64 MIL/uL — AB (ref 4.22–5.81)
RDW: 13.7 % (ref 11.5–15.5)
RDW: 13.9 % (ref 11.5–15.5)
WBC: 8.7 10*3/uL (ref 4.0–10.5)
WBC: 11 10*3/uL — AB (ref 4.0–10.5)

## 2015-08-14 LAB — BASIC METABOLIC PANEL
ANION GAP: 7 (ref 5–15)
BUN: 11 mg/dL (ref 6–20)
CALCIUM: 7.9 mg/dL — AB (ref 8.9–10.3)
CHLORIDE: 105 mmol/L (ref 101–111)
CO2: 24 mmol/L (ref 22–32)
Creatinine, Ser: 1.12 mg/dL (ref 0.61–1.24)
GFR calc Af Amer: >60 mL/min (ref >60)
GFR calc non Af Amer: >60 mL/min (ref >60)
GLUCOSE: 132 mg/dL — AB (ref 65–99)
Potassium: 4.4 mmol/L (ref 3.5–5.1)
SODIUM: 136 mmol/L (ref 135–145)

## 2015-08-14 LAB — CREATININE, SERUM
Creatinine, Ser: 1.33 mg/dL — ABNORMAL HIGH (ref 0.61–1.24)
GFR, EST NON AFRICAN AMERICAN: 53 mL/min — AB (ref >60)

## 2015-08-14 LAB — MAGNESIUM
MAGNESIUM: 2 mg/dL (ref 1.7–2.4)
Magnesium: 2.3 mg/dL (ref 1.7–2.4)

## 2015-08-14 LAB — POCT I-STAT 3, ART BLOOD GAS (G3+)
ACID-BASE DEFICIT: 2 mmol/L (ref 0.0–2.0)
ACID-BASE DEFICIT: 2 mmol/L (ref 0.0–2.0)
Bicarbonate: 22.8 mEq/L (ref 20.0–24.0)
Bicarbonate: 23.8 mEq/L (ref 20.0–24.0)
O2 SAT: 90 %
O2 SAT: 96 %
PCO2 ART: 41.2 mmHg (ref 35.0–45.0)
PCO2 ART: 44.6 mmHg (ref 35.0–45.0)
PO2 ART: 86 mmHg (ref 80.0–100.0)
Patient temperature: 37.6
Patient temperature: 38.1
TCO2: 24 mmol/L (ref 0–100)
TCO2: 25 mmol/L (ref 0–100)
pH, Arterial: 7.341 — ABNORMAL LOW (ref 7.350–7.450)
pH, Arterial: 7.353 (ref 7.350–7.450)
pO2, Arterial: 66 mmHg — ABNORMAL LOW (ref 80.0–100.0)

## 2015-08-14 MED ORDER — INSULIN DETEMIR 100 UNIT/ML ~~LOC~~ SOLN
20.0000 [IU] | Freq: Every day | SUBCUTANEOUS | Status: DC
  Administered 2015-08-15 – 2015-08-18 (×4): 20 [IU] via SUBCUTANEOUS
  Filled 2015-08-14 (×4): qty 0.2

## 2015-08-14 MED ORDER — INSULIN ASPART 100 UNIT/ML ~~LOC~~ SOLN
0.0000 [IU] | SUBCUTANEOUS | Status: DC
  Administered 2015-08-14 (×2): 2 [IU] via SUBCUTANEOUS
  Administered 2015-08-14 (×2): 4 [IU] via SUBCUTANEOUS
  Administered 2015-08-15 (×3): 2 [IU] via SUBCUTANEOUS

## 2015-08-14 MED ORDER — ENOXAPARIN SODIUM 40 MG/0.4ML ~~LOC~~ SOLN
40.0000 mg | Freq: Every day | SUBCUTANEOUS | Status: DC
  Administered 2015-08-14 – 2015-08-17 (×4): 40 mg via SUBCUTANEOUS
  Filled 2015-08-14 (×4): qty 0.4

## 2015-08-14 MED ORDER — AMIODARONE HCL IN DEXTROSE 360-4.14 MG/200ML-% IV SOLN
60.0000 mg/h | INTRAVENOUS | Status: AC
  Administered 2015-08-14 (×2): 60 mg/h via INTRAVENOUS
  Filled 2015-08-14: qty 200

## 2015-08-14 MED ORDER — AMIODARONE HCL IN DEXTROSE 360-4.14 MG/200ML-% IV SOLN
30.0000 mg/h | INTRAVENOUS | Status: DC
  Administered 2015-08-14 – 2015-08-15 (×2): 30 mg/h via INTRAVENOUS
  Filled 2015-08-14 (×2): qty 200

## 2015-08-14 MED ORDER — AMIODARONE LOAD VIA INFUSION
150.0000 mg | Freq: Once | INTRAVENOUS | Status: AC
  Administered 2015-08-14: 150 mg via INTRAVENOUS

## 2015-08-14 MED ORDER — AMIODARONE LOAD VIA INFUSION
150.0000 mg | Freq: Once | INTRAVENOUS | Status: AC
  Administered 2015-08-14: 150 mg via INTRAVENOUS
  Filled 2015-08-14: qty 83.34

## 2015-08-14 MED ORDER — INSULIN DETEMIR 100 UNIT/ML ~~LOC~~ SOLN
20.0000 [IU] | Freq: Every day | SUBCUTANEOUS | Status: DC
  Administered 2015-08-14: 20 [IU] via SUBCUTANEOUS
  Filled 2015-08-14 (×2): qty 0.2

## 2015-08-14 MED ORDER — FUROSEMIDE 10 MG/ML IJ SOLN
40.0000 mg | Freq: Once | INTRAMUSCULAR | Status: AC
  Administered 2015-08-14: 40 mg via INTRAVENOUS
  Filled 2015-08-14: qty 4

## 2015-08-14 MED ORDER — AMIODARONE HCL IN DEXTROSE 360-4.14 MG/200ML-% IV SOLN
INTRAVENOUS | Status: AC
Start: 2015-08-14 — End: 2015-08-15
  Filled 2015-08-14: qty 200

## 2015-08-14 NOTE — Progress Notes (Signed)
1 Day Post-Op Procedure(s) (LRB): CORONARY ARTERY BYPASS GRAFTING (CABG) x 4 using left internal mammary artery and right leg saphenous vein (N/A) TRANSESOPHAGEAL ECHOCARDIOGRAM (TEE) (N/A) Subjective: No complaints  Objective: Vital signs in last 24 hours: Temp:  [95.9 F (35.5 C)-101.3 F (38.5 C)] 99.5 F (37.5 C) (04/25 0645) Pulse Rate:  [77-86] 83 (04/25 0645) Cardiac Rhythm:  [-] Normal sinus rhythm (04/24 1945) Resp:  [0-32] 27 (04/25 0715) BP: (83-150)/(58-86) 137/70 mmHg (04/25 0700) SpO2:  [93 %-100 %] 96 % (04/25 0645) Arterial Line BP: (95-166)/(42-74) 164/58 mmHg (04/25 0715) FiO2 (%):  [40 %-50 %] 40 % (04/24 2205)  Hemodynamic parameters for last 24 hours: PAP: (18-37)/(9-18) 28/14 mmHg CO:  [3.4 L/min-4.2 L/min] 4.2 L/min CI:  [1.7 L/min/m2-2.2 L/min/m2] 2.2 L/min/m2  Intake/Output from previous day: 04/24 0701 - 04/25 0700 In: 5355.5 [I.V.:4060.5; Blood:115; NG/GT:30; IV Piggyback:1150] Out: [id number] [Urine:3450; Blood:300; Chest Tube:640] Intake/Output this shift:    General appearance: alert and cooperative Neurologic: intact Heart: regular rate and rhythm, S1, S2 normal, no murmur, click, rub or gallop Lungs: clear to auscultation bilaterally Extremities: edema mild Wound: aquacel in place  Lab Results:  Recent Labs  08/13/15 1853 08/13/15 1904 08/14/15 0430  WBC 8.8  --  8.7  HGB 11.2* 11.2* 10.6*  HCT 32.1* 33.0* 30.7*  PLT 123*  --  127*   BMET:  Recent Labs  08/13/15 1904 08/14/15 0430  NA 138 136  K 4.4 4.4  CL 104 105  CO2  --  24  GLUCOSE 142* 132*  BUN 13 11  CREATININE 1.00 1.12  CALCIUM  --  7.9*    PT/INR:  Recent Labs  08/13/15 1303  LABPROT 19.9*  INR 1.69*   ABG    Component Value Date/Time   PHART 7.353 08/13/2015 2348   HCO3 22.8 08/13/2015 2348   TCO2 24 08/13/2015 2348   ACIDBASEDEF 2.0 08/13/2015 2348   O2SAT 96.0 08/13/2015 2348   CBG (last 3)   Recent Labs  08/13/15 2222 08/13/15 2327  08/13/15 2348  GLUCAP 111* 151* 129*   CXR: LLL atelectasis or infiltrate  ECG: sinus rhythm, LBBB old  Assessment/Plan: S/P Procedure(s) (LRB): CORONARY ARTERY BYPASS GRAFTING (CABG) x 4 using left internal mammary artery and right leg saphenous vein (N/A) TRANSESOPHAGEAL ECHOCARDIOGRAM (TEE) (N/A)  He is hemodynamically stable in sinus rhythm. Mobilize Diuresis Diabetes control: preop Hgb A1c 7.1. Start Levemir and SSI and discontinue drip. Expected acute postop blood loss anemia: observe d/c tubes/lines Continue foley due to diuresing patient and patient in ICU See progression orders   LOS: 1 day    [name] [name] 08/14/2015

## 2015-08-14 NOTE — Care Management Note (Signed)
Case Management Note  Patient Details  Name: CREEDENCE LANDAVERDE MRN: [id number] Date of Birth: 10/14/1945  Subjective/Objective:      S/p  CABG              Action/Plan:  PTA - independent from home with wife.  CM will continue to monitor for disposition needs   Expected Discharge Date:                  Expected Discharge Plan:  Home/Self Care  In-House Referral:     Discharge planning Services  CM Consult  Post Acute Care Choice:    Choice offered to:     DME Arranged:    DME Agency:     HH Arranged:    HH Agency:     Status of Service:  In process, will continue to follow  Medicare Important Message Given:    Date Medicare IM Given:    Medicare IM give by:    Date Additional Medicare IM Given:    Additional Medicare Important Message give by:     If discussed at [place] of Stay Meetings, dates discussed:    Additional Comments:  Maryclare Labrador, RN 08/14/2015, 11:21 AM

## 2015-08-14 NOTE — Progress Notes (Signed)
      [address]       [place] [address]             [phone]      POD # 1 CABG  Up in chair  BP 105/67 mmHg  Pulse 98  Temp(Src) 98.3 F (36.8 C) (Oral)  Resp 28  Ht 5\' 7"  (1.702 m)  Wt 196 lb 6.9 oz (89.1 kg)  BMI 30.76 kg/m2  SpO2 97%   Intake/Output Summary (Last 24 hours) at 08/14/15 1830 Last data filed at 08/14/15 1800  Gross per 24 hour  Intake 2434.16 ml  Output   3715 ml  Net -1280.84 ml   Creatinine= 1.33, Hct= 34  In atrial fib with rate 100-118 at present. On amiodarone drip has been bolused twice today  Remo Lipps C. Roxan Hockey, MD [facility] [phone]

## 2015-08-14 NOTE — Progress Notes (Signed)
Notified Dr. Cyndia Bent pt went into afib RVR with rates sustaining in the 130s-140s. BP stable. New orders to give 150mg  amio bolus and start on amio gtt. Will continue to monitor closely.

## 2015-08-15 ENCOUNTER — Inpatient Hospital Stay (HOSPITAL_COMMUNITY): Payer: PPO

## 2015-08-15 LAB — GLUCOSE, CAPILLARY
Glucose-Capillary: 138 mg/dL — ABNORMAL HIGH (ref 65–99)
Glucose-Capillary: 134 mg/dL — ABNORMAL HIGH (ref 65–99)
Glucose-Capillary: 146 mg/dL — ABNORMAL HIGH (ref 65–99)
Glucose-Capillary: 140 mg/dL — ABNORMAL HIGH (ref 65–99)
Glucose-Capillary: 115 mg/dL — ABNORMAL HIGH (ref 65–99)

## 2015-08-15 LAB — BASIC METABOLIC PANEL
ANION GAP: 9 (ref 5–15)
BUN: 12 mg/dL (ref 6–20)
CO2: 27 mmol/L (ref 22–32)
Calcium: 8.4 mg/dL — ABNORMAL LOW (ref 8.9–10.3)
Chloride: 100 mmol/L — ABNORMAL LOW (ref 101–111)
Creatinine, Ser: 1.19 mg/dL (ref 0.61–1.24)
Glucose, Bld: 143 mg/dL — ABNORMAL HIGH (ref 65–99)
POTASSIUM: 3.9 mmol/L (ref 3.5–5.1)
SODIUM: 136 mmol/L (ref 135–145)

## 2015-08-15 LAB — CBC
HEMATOCRIT: 32.4 % — AB (ref 39.0–52.0)
Hemoglobin: 11.2 g/dL — ABNORMAL LOW (ref 13.0–17.0)
MCH: 31.8 pg (ref 26.0–34.0)
MCHC: 34.6 g/dL (ref 30.0–36.0)
MCV: 92 fL (ref 78.0–100.0)
Platelets: 130 10*3/uL — ABNORMAL LOW (ref 150–400)
RBC: 3.52 MIL/uL — ABNORMAL LOW (ref 4.22–5.81)
RDW: 13.8 % (ref 11.5–15.5)
WBC: 11.7 10*3/uL — AB (ref 4.0–10.5)

## 2015-08-15 MED ORDER — PANTOPRAZOLE SODIUM 40 MG PO TBEC
40.0000 mg | DELAYED_RELEASE_TABLET | Freq: Every day | ORAL | Status: DC
  Administered 2015-08-16 – 2015-08-18 (×3): 40 mg via ORAL
  Filled 2015-08-15 (×3): qty 1

## 2015-08-15 MED ORDER — ONDANSETRON HCL 4 MG PO TABS: 4.0000 mg | ORAL_TABLET | Freq: Four times a day (QID) | ORAL | Status: DC | PRN

## 2015-08-15 MED ORDER — BISACODYL 10 MG RE SUPP: 10.0000 mg | Freq: Every day | RECTAL | Status: DC | PRN

## 2015-08-15 MED ORDER — DOCUSATE SODIUM 100 MG PO CAPS
200.0000 mg | ORAL_CAPSULE | Freq: Every day | ORAL | Status: DC
  Administered 2015-08-16 – 2015-08-18 (×3): 200 mg via ORAL
  Filled 2015-08-15 (×3): qty 2

## 2015-08-15 MED ORDER — ACETAMINOPHEN 325 MG PO TABS: 650.0000 mg | ORAL_TABLET | Freq: Four times a day (QID) | ORAL | Status: DC | PRN

## 2015-08-15 MED ORDER — SODIUM CHLORIDE 0.9 % IV SOLN: 250.0000 mL | INTRAVENOUS | Status: DC | PRN

## 2015-08-15 MED ORDER — SODIUM CHLORIDE 0.9% FLUSH
3.0000 mL | Freq: Two times a day (BID) | INTRAVENOUS | Status: DC
  Administered 2015-08-15 – 2015-08-17 (×5): 3 mL via INTRAVENOUS

## 2015-08-15 MED ORDER — ONDANSETRON HCL 4 MG/2ML IJ SOLN: 4.0000 mg | Freq: Four times a day (QID) | INTRAMUSCULAR | Status: DC | PRN

## 2015-08-15 MED ORDER — METOPROLOL TARTRATE 25 MG PO TABS
25.0000 mg | ORAL_TABLET | Freq: Two times a day (BID) | ORAL | Status: DC
  Administered 2015-08-15 – 2015-08-16 (×2): 25 mg via ORAL
  Filled 2015-08-15 (×2): qty 1

## 2015-08-15 MED ORDER — INSULIN ASPART 100 UNIT/ML ~~LOC~~ SOLN
0.0000 [IU] | Freq: Three times a day (TID) | SUBCUTANEOUS | Status: DC
  Administered 2015-08-15 – 2015-08-18 (×8): 2 [IU] via SUBCUTANEOUS

## 2015-08-15 MED ORDER — TRAMADOL HCL 50 MG PO TABS: 50.0000 mg | ORAL_TABLET | ORAL | Status: DC | PRN

## 2015-08-15 MED ORDER — ASPIRIN EC 325 MG PO TBEC
325.0000 mg | DELAYED_RELEASE_TABLET | Freq: Every day | ORAL | Status: DC
  Administered 2015-08-16: 325 mg via ORAL
  Filled 2015-08-15: qty 1

## 2015-08-15 MED ORDER — MOVING RIGHT ALONG BOOK
Freq: Once | Status: AC
  Administered 2015-08-15: 16:00:00
  Filled 2015-08-15: qty 1

## 2015-08-15 MED ORDER — FUROSEMIDE 40 MG PO TABS
40.0000 mg | ORAL_TABLET | Freq: Every day | ORAL | Status: DC
  Administered 2015-08-15 – 2015-08-16 (×2): 40 mg via ORAL
  Filled 2015-08-15 (×2): qty 1

## 2015-08-15 MED ORDER — AMIODARONE HCL 200 MG PO TABS
400.0000 mg | ORAL_TABLET | Freq: Two times a day (BID) | ORAL | Status: DC
  Administered 2015-08-15 – 2015-08-17 (×6): 400 mg via ORAL
  Filled 2015-08-15 (×6): qty 2

## 2015-08-15 MED ORDER — POTASSIUM CHLORIDE CRYS ER 20 MEQ PO TBCR
20.0000 meq | EXTENDED_RELEASE_TABLET | Freq: Two times a day (BID) | ORAL | Status: DC
  Administered 2015-08-15 – 2015-08-16 (×4): 20 meq via ORAL
  Filled 2015-08-15 (×4): qty 1

## 2015-08-15 MED ORDER — OXYCODONE HCL 5 MG PO TABS: 5.0000 mg | ORAL_TABLET | ORAL | Status: DC | PRN

## 2015-08-15 MED ORDER — BISACODYL 5 MG PO TBEC
10.0000 mg | DELAYED_RELEASE_TABLET | Freq: Every day | ORAL | Status: DC | PRN
  Administered 2015-08-16: 10 mg via ORAL
  Filled 2015-08-15: qty 2

## 2015-08-15 MED ORDER — SODIUM CHLORIDE 0.9% FLUSH: 3.0000 mL | INTRAVENOUS | Status: DC | PRN

## 2015-08-15 NOTE — Progress Notes (Signed)
2 Days Post-Op Procedure(s) (LRB): CORONARY ARTERY BYPASS GRAFTING (CABG) x 4 using left internal mammary artery and right leg saphenous vein (N/A) TRANSESOPHAGEAL ECHOCARDIOGRAM (TEE) (N/A) Subjective:  Did not sleep well. Too noisy.   Converted to sinus at 7:50 this am.  Objective: Vital signs in last 24 hours: Temp:  [98.2 F (36.8 C)-98.8 F (37.1 C)] 98.2 F (36.8 C) (04/26 0700) Pulse Rate:  [38-124] 102 (04/26 0700) Cardiac Rhythm:  [-] Atrial fibrillation (04/26 0400) Resp:  [12-34] 26 (04/26 0700) BP: (84-135)/(54-90) 117/87 mmHg (04/26 0700) SpO2:  [92 %-97 %] 93 % (04/26 0700) Arterial Line BP: (174)/(66) 174/66 mmHg (04/25 0900) Weight:  [88.4 kg (194 lb 14.2 oz)] 88.4 kg (194 lb 14.2 oz) (04/26 0500)  Hemodynamic parameters for last 24 hours:    Intake/Output from previous day: 04/25 0701 - 04/26 0700 In: 1665.3 [I.V.:1565.3; IV Piggyback:100] Out: 2725 [Urine:2675; Chest Tube:50] Intake/Output this shift:    General appearance: alert and cooperative Neurologic: intact Heart: regular rate and rhythm, S1, S2 normal, no murmur, click, rub or gallop Lungs: clear to auscultation bilaterally Extremities: edema minimal Wound: Aquacel in place  Lab Results:  Recent Labs  08/14/15 1700 08/15/15 0315  WBC 11.0* 11.7*  HGB 11.8* 11.2*  HCT 33.8* 32.4*  PLT 146* 130*   BMET:  Recent Labs  08/14/15 0430 08/14/15 1700 08/15/15 0315  NA 136  --  136  K 4.4  --  3.9  CL 105  --  100*  CO2 24  --  27  GLUCOSE 132*  --  143*  BUN 11  --  12  CREATININE 1.12 1.33* 1.19  CALCIUM 7.9*  --  8.4*    PT/INR:  Recent Labs  08/13/15 1303  LABPROT 19.9*  INR 1.69*   ABG    Component Value Date/Time   PHART 7.353 08/13/2015 2348   HCO3 22.8 08/13/2015 2348   TCO2 24 08/13/2015 2348   ACIDBASEDEF 2.0 08/13/2015 2348   O2SAT 96.0 08/13/2015 2348   CBG (last 3)   Recent Labs  08/14/15 2102 08/14/15 2346 08/15/15 0326  GLUCAP 174* 156* 138*    CXR: left base atelectasis.  Assessment/Plan: S/P Procedure(s) (LRB): CORONARY ARTERY BYPASS GRAFTING (CABG) x 4 using left internal mammary artery and right leg saphenous vein (N/A) TRANSESOPHAGEAL ECHOCARDIOGRAM (TEE) (N/A)  He is hemodynamically stable  Postop atrial fibrillation converted to sinus this am on amio drip. Will start po amio and DC drip after a couple hrs so we can get sleeve out.  Mild volume excess: weight is 6 lbs over preop. Continue diuretic.  IS, ambulation  Diabetes: glucose under adequate control on Levemir and SSI. Will resume Metformin when eating well.  Transfer to 2W stepdown circle to monitor rhythm.     LOS: 2 days    Gaye Pollack 08/15/2015

## 2015-08-15 NOTE — Progress Notes (Signed)
CARDIAC REHAB PHASE I   PRE:  Rate/Rhythm: 80 SR    BP: sitting 119/61    SaO2: 94 2L  MODE:  Ambulation: 350 ft   POST:  Rate/Rhythm: 96 SR    BP: sitting 153/71     SaO2: 90 RA Pt able to get out of bed with min assist. Steady with RW. Walked on RA without SOB or problems but SaO2 at end of walk 90 RA. Reapplied 2L for bed. Pt ready for a nap. Encouraged 3rd walk later and more IS. [id number]   Darrick Meigs CES, ACSM 08/15/2015 2:59 PM

## 2015-08-15 NOTE — Progress Notes (Signed)
Pt received from [facility]. Pt and wife oriented to room and equipment. Pt denies pain. VSS. Telemetry applied, CCMD notified. Call light within reach. Will continue to monitor.   Fritz Pickerel, RN

## 2015-08-16 ENCOUNTER — Other Ambulatory Visit: Payer: Self-pay | Admitting: *Deleted

## 2015-08-16 LAB — GLUCOSE, CAPILLARY
GLUCOSE-CAPILLARY: 103 mg/dL — AB (ref 65–99)
GLUCOSE-CAPILLARY: 125 mg/dL — AB (ref 65–99)
GLUCOSE-CAPILLARY: 139 mg/dL — AB (ref 65–99)
GLUCOSE-CAPILLARY: 127 mg/dL — AB (ref 65–99)

## 2015-08-16 LAB — BASIC METABOLIC PANEL
Anion gap: 9 (ref 5–15)
BUN: 18 mg/dL (ref 6–20)
CALCIUM: 8.3 mg/dL — AB (ref 8.9–10.3)
CO2: 28 mmol/L (ref 22–32)
CREATININE: 1.33 mg/dL — AB (ref 0.61–1.24)
Chloride: 101 mmol/L (ref 101–111)
GFR calc Af Amer: >60 mL/min (ref >60)
GFR, EST NON AFRICAN AMERICAN: 53 mL/min — AB (ref >60)
GLUCOSE: 124 mg/dL — AB (ref 65–99)
Potassium: 4.4 mmol/L (ref 3.5–5.1)
Sodium: 138 mmol/L (ref 135–145)

## 2015-08-16 LAB — CBC
HEMATOCRIT: 30.1 % — AB (ref 39.0–52.0)
Hemoglobin: 10.2 g/dL — ABNORMAL LOW (ref 13.0–17.0)
MCH: 31.4 pg (ref 26.0–34.0)
MCHC: 33.9 g/dL (ref 30.0–36.0)
MCV: 92.6 fL (ref 78.0–100.0)
PLATELETS: 127 10*3/uL — AB (ref 150–400)
RBC: 3.25 MIL/uL — ABNORMAL LOW (ref 4.22–5.81)
RDW: 13.9 % (ref 11.5–15.5)
WBC: 8.2 10*3/uL (ref 4.0–10.5)

## 2015-08-16 MED ORDER — METOPROLOL TARTRATE 5 MG/5ML IV SOLN
5.0000 mg | Freq: Once | INTRAVENOUS | Status: AC
  Administered 2015-08-16: 5 mg via INTRAVENOUS
  Filled 2015-08-16: qty 5

## 2015-08-16 MED ORDER — METOPROLOL TARTRATE 25 MG PO TABS
25.0000 mg | ORAL_TABLET | Freq: Three times a day (TID) | ORAL | Status: DC
  Administered 2015-08-16 – 2015-08-18 (×6): 25 mg via ORAL
  Filled 2015-08-16 (×6): qty 1

## 2015-08-16 MED ORDER — WARFARIN - PHYSICIAN DOSING INPATIENT: Freq: Every day | Status: DC

## 2015-08-16 MED ORDER — WARFARIN SODIUM 5 MG PO TABS
5.0000 mg | ORAL_TABLET | Freq: Once | ORAL | Status: AC
  Administered 2015-08-16: 5 mg via ORAL
  Filled 2015-08-16: qty 1

## 2015-08-16 MED ORDER — ASPIRIN EC 81 MG PO TBEC
81.0000 mg | DELAYED_RELEASE_TABLET | Freq: Every day | ORAL | Status: DC
  Administered 2015-08-17 – 2015-08-18 (×2): 81 mg via ORAL
  Filled 2015-08-16 (×2): qty 1

## 2015-08-16 MED ORDER — AMIODARONE IV BOLUS ONLY 150 MG/100ML
150.0000 mg | Freq: Once | INTRAVENOUS | Status: AC
  Administered 2015-08-16: 150 mg via INTRAVENOUS
  Filled 2015-08-16: qty 100

## 2015-08-16 NOTE — Progress Notes (Addendum)
Notified by CCMD that pt converted to afib at [place]. HR 120s-140s. Pt asymptomatic. PA Jadene Pierini made aware - given verbal order for IV amio bolus and to give am metoprolol dose. Both medications given. Pt BP stable. Will continue to monitor.   Informed PA that pt continues to be in afib rate 100s-120s - occasionally bumps to 130s. Told to continue monitoring and notify if pt sustains 130s.   Fritz Pickerel, RN

## 2015-08-16 NOTE — Progress Notes (Signed)
Pt walked 350 ft around unit today. No distress noted or complaints. Pt was able to walk and talk with no distress.

## 2015-08-16 NOTE — Progress Notes (Signed)
CARDIAC REHAB PHASE I   Pt HR remains elevated 120s-130s at rest since this am. Spoke with pt who states he feels very tired and spoke with pt RN who agrees to hold ambulation until pt HR better controlled. Will follow-up tomorrow.   Lenna Sciara, RN, BSN 08/16/2015 1:39 PM

## 2015-08-16 NOTE — Care Management Important Message (Signed)
Important Message  Patient Details  Name: [name] [name] MRN: [id number] Date of Birth: 1945/07/04   Medicare Important Message Given:  Yes    Nathen May 08/16/2015, 1:36 PM

## 2015-08-16 NOTE — Research (Addendum)
[address]       [place] [address]             [phone]      3 Days Post-Op Procedure(s) (LRB): CORONARY ARTERY BYPASS GRAFTING (CABG) x 4 using left internal mammary artery and right leg saphenous vein (N/A) TRANSESOPHAGEAL ECHOCARDIOGRAM (TEE) (N/A) Subjective: Feels pretty well. Is having some PAF  Objective: Vital signs in last 24 hours: Temp:  [98 F (36.7 C)-98.4 F (36.9 C)] 98.4 F (36.9 C) (04/27 0320) Pulse Rate:  [75-86] 80 (04/27 0320) Cardiac Rhythm:  [-] Normal sinus rhythm;Bundle branch block (04/26 1917) Resp:  [14-23] 18 (04/27 0320) BP: (92-162)/(61-83) 162/69 mmHg (04/27 0320) SpO2:  [90 %-98 %] 97 % (04/27 0320) Weight:  [192 lb 3.2 oz (87.181 kg)] 192 lb 3.2 oz (87.181 kg) (04/27 0320)  Hemodynamic parameters for last 24 hours:    Intake/Output from previous day: 04/26 0701 - 04/27 0700 In: 639.8 [P.O.:360; I.V.:229.8; IV Piggyback:50] Out: 145 [Urine:145] Intake/Output this shift:    General appearance: alert, cooperative and no distress Heart: regular rate and rhythm Lungs: mildly dim in bases Abdomen: benign  Extremities: minor RLE edema Wound: RLE EVH site ok, chest dressing intact  Lab Results:  Recent Labs  08/15/15 0315 08/16/15 0300  WBC 11.7* 8.2  HGB 11.2* 10.2*  HCT 32.4* 30.1*  PLT 130* 127*   BMET:  Recent Labs  08/15/15 0315 08/16/15 0300  NA 136 138  K 3.9 4.4  CL 100* 101  CO2 27 28  GLUCOSE 143* 124*  BUN 12 18  CREATININE 1.19 1.33*  CALCIUM 8.4* 8.3*    PT/INR:  Recent Labs  08/13/15 1303  LABPROT 19.9*  INR 1.69*   ABG    Component Value Date/Time   PHART 7.353 08/13/2015 2348   HCO3 22.8 08/13/2015 2348   TCO2 24 08/13/2015 2348   ACIDBASEDEF 2.0 08/13/2015 2348   O2SAT 96.0 08/13/2015 2348   CBG (last 3)   Recent Labs  08/15/15 1652 08/15/15 2105 08/16/15 0628  GLUCAP 140* 115* 103*    Meds Scheduled Meds: . amiodarone  400 mg Oral BID  . antiseptic  oral rinse  7 mL Mouth Rinse 10 times per day  . aspirin EC  325 mg Oral Daily  . chlorhexidine gluconate (SAGE KIT)  15 mL Mouth Rinse BID  . docusate sodium  200 mg Oral Daily  . enoxaparin (LOVENOX) injection  40 mg Subcutaneous QHS  . furosemide  40 mg Oral Daily  . insulin aspart  0-24 Units Subcutaneous TID AC & HS  . insulin detemir  20 Units Subcutaneous Daily  . metoprolol tartrate  25 mg Oral BID  . pantoprazole  40 mg Oral QAC breakfast  . potassium chloride  20 mEq Oral BID  . simvastatin  20 mg Oral Daily  . sodium chloride flush  3 mL Intravenous Q12H   Continuous Infusions:  PRN Meds:.sodium chloride, acetaminophen, bisacodyl **OR** bisacodyl, ondansetron **OR** ondansetron (ZOFRAN) IV, oxyCODONE, sodium chloride flush, traMADol  Xrays Dg Chest Port 1 View  08/15/2015  CLINICAL DATA:  Followup congestive heart failure 08/14/2015 EXAM: PORTABLE CHEST 1 VIEW COMPARISON:  None. FINDINGS: Stable cardiac enlargement. Swan-Ganz catheter has been removed with right internal jugular vascular sheath. Left chest tube is been removed. No pneumothorax. Minimal right base atelectasis. More extensive opacity in the left lower lobe similar to prior study likely representing a combination of atelectasis and pleural fluid. IMPRESSION: Support  devices as described with persistent left lower lobe opacity Electronically Signed   By: Skipper Cliche M.D.   On: 08/15/2015 08:07    Assessment/Plan: S/P Procedure(s) (LRB): CORONARY ARTERY BYPASS GRAFTING (CABG) x 4 using left internal mammary artery and right leg saphenous vein (N/A) TRANSESOPHAGEAL ECHOCARDIOGRAM (TEE) (N/A)  1 doing well 2 afib cont amio and beta blocker, monitor closely with BBB 3 labs are stable, creat is up a little, monitor, wt about 4 # up still-cont to diurese, hold on ACE for now. 4 SBP 92 - 162 , monitor , if creat remains stable possibly restart benicar 5 push rehab/pulm toilet  LOS: 3 days    [name] [name],[name] [name]  E 08/16/2015   Chart reviewed, patient examined, agree with above. He is back in atrial fib with RVR this am and received 150 mg bolus of amio without conversion. HR still 120's so will give another bolus and increase lopressor since BP ok. Hold off on ACE. He will likely need anticoagulation at discharge for a while if he continues to have atrial fib. Will start coumadin. Check ECG in am for QTc.

## 2015-08-16 NOTE — Progress Notes (Signed)
Utilization review completed.  

## 2015-08-16 NOTE — Consult Note (Signed)
   [facility] CM Inpatient Consult   08/16/2015  DEITRICK CAPRA [birth year] [id number]   Went to bedside to speak with patient's wife, per his request. Spoke with Laurian Brim about [place] Management program services. Mrs. Deems is agreeable as she states she is familiar with the program. Written consent obtained. Explained that patient will receive post hospital transition of care calls and will be evaluated for monthly home visits. Explained that [place] Management will not interfere or replace any other services he should have in place. [facility] Care Management packet and contact information left at bedside. Mrs. Kerstetter requests that she be called for post hospital transition of care calls at home [phone] or her cell number at [phone].  Patient has history of HLD, HTN, DM and is s/p CABG. Will request for patient to be assigned to [facility] for post discharge follow up. Inpatient RNCM aware that patient and wife are agreeable to [place] Management program services.  Marthenia Rolling, MSN-Ed, RN,BSN [facility] Liaison [phone]

## 2015-08-16 NOTE — Consult Note (Signed)
   [facility] CM Inpatient Consult   08/16/2015  [name] [name] [birth year] [id number]   Received referral from inpatient RNCM for [name] services. Went to bedside to speak with patient about [name] program. He pleasantly asks writer to come back at another time because his wife went down to the coffee shop and will return in a little bit. States he is too groggy to discuss at this time and prefers Probation officer speak with his wife about program. Made inpatient RNCM aware. Will follow up at later time.  Marthenia Rolling, MSN-Ed, RN,BSN [facility] Liaison [phone]

## 2015-08-17 LAB — BASIC METABOLIC PANEL
Anion gap: 10 (ref 5–15)
BUN: 19 mg/dL (ref 6–20)
CO2: 28 mmol/L (ref 22–32)
Calcium: 8.9 mg/dL (ref 8.9–10.3)
Chloride: 101 mmol/L (ref 101–111)
Creatinine, Ser: 1.25 mg/dL — ABNORMAL HIGH (ref 0.61–1.24)
GFR calc Af Amer: >60 mL/min (ref >60)
GFR calc non Af Amer: 57 mL/min — ABNORMAL LOW (ref >60)
Glucose, Bld: 110 mg/dL — ABNORMAL HIGH (ref 65–99)
Potassium: 4.5 mmol/L (ref 3.5–5.1)
Sodium: 139 mmol/L (ref 135–145)

## 2015-08-17 LAB — GLUCOSE, CAPILLARY
GLUCOSE-CAPILLARY: 118 mg/dL — AB (ref 65–99)
Glucose-Capillary: 107 mg/dL — ABNORMAL HIGH (ref 65–99)
Glucose-Capillary: 131 mg/dL — ABNORMAL HIGH (ref 65–99)
Glucose-Capillary: 154 mg/dL — ABNORMAL HIGH (ref 65–99)

## 2015-08-17 LAB — PROTIME-INR
INR: 1.24 (ref 0.00–1.49)
PROTHROMBIN TIME: 15.8 s — AB (ref 11.6–15.2)

## 2015-08-17 MED ORDER — WARFARIN SODIUM 5 MG PO TABS
5.0000 mg | ORAL_TABLET | Freq: Once | ORAL | Status: AC
  Administered 2015-08-17: 5 mg via ORAL
  Filled 2015-08-17: qty 1

## 2015-08-17 MED ORDER — LISINOPRIL 2.5 MG PO TABS
2.5000 mg | ORAL_TABLET | Freq: Every day | ORAL | Status: DC
  Administered 2015-08-17: 2.5 mg via ORAL
  Filled 2015-08-17: qty 1

## 2015-08-17 NOTE — Progress Notes (Signed)
CARDIAC REHAB PHASE I   PRE:  Rate/Rhythm: *81 SR  BP:  Sitting: 153/76        SaO2: 96 RA  MODE:  Ambulation: 550 ft   POST:  Rate/Rhythm: 104 ST  BP:  Sitting: 138/73         SaO2: 96 RA  Pt ambulated 550 ft on RA, rolling walker, independent, steady gait, tolerated well with no complaints, able to carry on conversation for entire walk. Cardiac surgery discharge education completed with pt and wife at bedside. Reviewed risk factors, IS, sternal precautions, activity progression, exercise, heart healthy diet, carb counting, sodium restrictions, and phase 2 cardiac rehab. Left instructions to view cardiac surgery discharge video. Pt verbalized understanding, receptive to education. Pt agrees to phase 2 cardiac rehab referral, will send to [facility] per pt request. Pt to bed per pt request after walk, call bell within reach. Will follow.  [id number] Lenna Sciara, RN, BSN 08/17/2015 9:34 AM

## 2015-08-17 NOTE — Progress Notes (Addendum)
[address]       [place] [address]             [phone]      4 Days Post-Op Procedure(s) (LRB): CORONARY ARTERY BYPASS GRAFTING (CABG) x 4 using left internal mammary artery and right leg saphenous vein (N/A) TRANSESOPHAGEAL ECHOCARDIOGRAM (TEE) (N/A) Subjective: Feeling well, out of afib yesterday evening  Objective: Vital signs in last 24 hours: Temp:  [97.9 F (36.6 C)-99.2 F (37.3 C)] 98.3 F (36.8 C) (04/28 0535) Pulse Rate:  [78-111] 79 (04/28 0535) Cardiac Rhythm:  [-] Normal sinus rhythm;Bundle branch block (04/27 1900) Resp:  [18] 18 (04/28 0535) BP: (98-153)/(58-90) 153/83 mmHg (04/28 0535) SpO2:  [93 %-95 %] 93 % (04/28 0535) Weight:  [186 lb 14.4 oz (84.777 kg)] 186 lb 14.4 oz (84.777 kg) (04/28 0500)  Hemodynamic parameters for last 24 hours:    Intake/Output from previous day: 04/27 0701 - 04/28 0700 In: 720 [P.O.:720] Out: -  Intake/Output this shift:    General appearance: alert, cooperative and no distress Heart: regular rate and rhythm Lungs: clear to auscultation bilaterally Abdomen: benign Extremities: trace edema Wound: dressing CDI chest- to be removed today, evh site healing well  Lab Results:  Recent Labs  08/15/15 0315 08/16/15 0300  WBC 11.7* 8.2  HGB 11.2* 10.2*  HCT 32.4* 30.1*  PLT 130* 127*   BMET:  Recent Labs  08/16/15 0300 08/17/15 0426  NA 138 139  K 4.4 4.5  CL 101 101  CO2 28 28  GLUCOSE 124* 110*  BUN 18 19  CREATININE 1.33* 1.25*  CALCIUM 8.3* 8.9    PT/INR:  Recent Labs  08/17/15 0426  LABPROT 15.8*  INR 1.24   ABG    Component Value Date/Time   PHART 7.353 08/13/2015 2348   HCO3 22.8 08/13/2015 2348   TCO2 24 08/13/2015 2348   ACIDBASEDEF 2.0 08/13/2015 2348   O2SAT 96.0 08/13/2015 2348   CBG (last 3)   Recent Labs  08/16/15 1619 08/16/15 2112 08/17/15 0639  GLUCAP 139* 127* 107*    Meds Scheduled Meds: . amiodarone  400 mg Oral BID  . antiseptic oral  rinse  7 mL Mouth Rinse 10 times per day  . aspirin EC  81 mg Oral Daily  . chlorhexidine gluconate (SAGE KIT)  15 mL Mouth Rinse BID  . docusate sodium  200 mg Oral Daily  . enoxaparin (LOVENOX) injection  40 mg Subcutaneous QHS  . furosemide  40 mg Oral Daily  . insulin aspart  0-24 Units Subcutaneous TID AC & HS  . insulin detemir  20 Units Subcutaneous Daily  . metoprolol tartrate  25 mg Oral TID  . pantoprazole  40 mg Oral QAC breakfast  . potassium chloride  20 mEq Oral BID  . simvastatin  20 mg Oral Daily  . sodium chloride flush  3 mL Intravenous Q12H  . Warfarin - Physician Dosing Inpatient   Does not apply q1800   Continuous Infusions:  PRN Meds:.sodium chloride, acetaminophen, bisacodyl **OR** bisacodyl, ondansetron **OR** ondansetron (ZOFRAN) IV, oxyCODONE, sodium chloride flush, traMADol  Xrays No results found.  Assessment/Plan: S/P Procedure(s) (LRB): CORONARY ARTERY BYPASS GRAFTING (CABG) x 4 using left internal mammary artery and right leg saphenous vein (N/A) TRANSESOPHAGEAL ECHOCARDIOGRAM (TEE) (N/A)  1 conts to progress well 2 cont current afib management, coumadin 3 labs are stable 4 stop lasix 5 I think he will tol low dose ace inhib- BP up at  times 6 d/c epw's 7 poss home in am    LOS: 4 days    GOLD,WAYNE E 08/17/2015   Chart reviewed, patient examined, agree with above. He is back in sinus rhythm this am. 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 I think that should be ok. Continue amio and lopressor. I will plan to keep on coumadin until maintaining sinus off amio in 6 wks. I think he can go home tomorrow if maintaining sinus rhythm. I would send home on low dose 2.5 mg since he is on amio.

## 2015-08-17 NOTE — Progress Notes (Signed)
Removed patient wires at 1040. Patient tolerated procedure well with slight resistance to left side no bleeding or pain.

## 2015-08-17 NOTE — Discharge Instructions (Signed)
 Warfarin: What You Need to Know Warfarin is an anticoagulant. Anticoagulants help prevent the formation of blood clots. They also help stop the growth of blood clots. Warfarin is sometimes referred to as a "blood thinner."  Normally, when body tissues are cut or damaged, the blood clots in order to prevent blood loss. Sometimes clots form inside your blood vessels and obstruct the flow of blood through your circulatory system (thrombosis). These clots may travel through your bloodstream and become lodged in smaller blood vessels in your brain, which can cause a stroke, or in your lungs (pulmonary embolism). WHO SHOULD USE WARFARIN? Warfarin is prescribed for people at risk of developing harmful blood clots:  People with surgically implanted mechanical heart valves, irregular heart rhythms called atrial fibrillation, and certain clotting disorders.  People who have developed harmful blood clotting in the past, including those who have had a stroke or a pulmonary embolism, or thrombosis in their legs (deep vein thrombosis [DVT]).  People with an existing blood clot, such as a pulmonary embolism. WARFARIN DOSING Warfarin tablets come in different strengths. Each tablet strength is a different color, with the amount of warfarin (in milligrams) clearly printed on the tablet. If the color of your tablet is different than usual when you receive a new prescription, report it immediately to your pharmacist or health care provider. WARFARIN MONITORING The goal of warfarin therapy is to lessen the clotting tendency of blood but not prevent clotting completely. Your health care provider will monitor the anticoagulation effect of warfarin closely and adjust your dose as needed. For your safety, blood tests called prothrombin time (PT) or international normalized ratio (INR) are used to measure the effects of warfarin. Both of these tests can be done with a finger stick or a blood draw. The longer it takes the  blood to clot, the higher the PT or INR. Your health care provider will inform you of your "target" PT or INR range. If, at any time, your PT or INR is above the target range, there is a risk of bleeding. If your PT or INR is below the target range, there is a risk of clotting. Whether you are started on warfarin while you are in the hospital or in your health care provider's office, you will need to have your PT or INR checked within one week of starting the medicine. Initially, some people are asked to have their PT or INR checked as much as twice a week. Once you are on a stable maintenance dose, the PT or INR is checked less often, usually once every 2 to 4 weeks. The warfarin dose may be adjusted if the PT or INR is not within the target range. It is important to keep all laboratory and health care provider follow-up appointments. Not keeping appointments could result in a chronic or permanent injury, pain, or disability because warfarin is a medicine that requires close monitoring. WHAT ARE THE SIDE EFFECTS OF WARFARIN?  Too much warfarin can cause bleeding (hemorrhage) from any part of the body. This may include bleeding from the gums, blood in the urine, bloody or dark stools, a nosebleed that is not easily stopped, coughing up blood, or vomiting blood.  Too little warfarin can increase the risk of blood clots.  Too little or too much warfarin can also increase the risk of a stroke.  Warfarin use may cause a skin rash or irritation, an unusual fever, continual nausea or stomach upset, or severe pain in your joints or   back. SPECIAL PRECAUTIONS WHILE TAKING WARFARIN Warfarin should be taken exactly as directed. It is very important to take warfarin as directed since bleeding or blood clots could result in chronic or permanent injury, pain, or disability.  Take your medicine at the same time every day. If you forget to take your dose, you can take it if it is within 6 hours of when it was  due.  Do not change the dose of warfarin on your own to make up for missed or extra doses.  If you miss more than 2 doses in a row, you should contact your health care provider for advice. Avoid situations that cause bleeding. You may have a tendency to bleed more easily than usual while taking warfarin. The following actions can limit bleeding:  Using a softer toothbrush.  Flossing with waxed floss rather than unwaxed floss.  Shaving with an electric razor rather than a blade.  Limiting the use of sharp objects.  Avoiding potentially harmful activities, such as contact sports. Warfarin and Pregnancy or Breastfeeding  Warfarin is not advised during the first trimester of pregnancy due to an increased risk of birth defects. In certain situations, a woman may take warfarin after her first trimester of pregnancy. A woman who becomes pregnant or plans to become pregnant while taking warfarin should notify her health care provider immediately.  Although warfarin does not pass into breast milk, a woman who wishes to breastfeed while taking warfarin should also consult with her health care provider. Alcohol, Smoking, and Illicit Drug Use  Alcohol affects how warfarin works in the body. It is best to avoid alcoholic drinks or consume very small amounts while taking warfarin. In general, alcohol intake should be limited to 1 oz (30 mL) of liquor, 6 oz (180 mL) of wine, or 12 oz (360 mL) of beer each day. Notify your health care provider if you change your alcohol intake.  Smoking affects how warfarin works. It is best to avoid smoking while taking warfarin. Notify your health care provider if you change your smoking habits.  It is best to avoid all illicit drugs while taking warfarin since there are few studies that show how warfarin interacts with these drugs. Other Medicines and Dietary Supplements Many prescription and over-the-counter medicines can interfere with warfarin. Be sure all of your  health care providers know you are taking warfarin. Notify your health care provider who prescribed warfarin for you or your pharmacist before starting or stopping any new medicines, including over-the-counter vitamins, dietary supplements, and pain medicines. Your warfarin dose may need to be adjusted. Some common over-the-counter medicines that may increase the risk of bleeding while taking warfarin include:   Acetaminophen.  Aspirin.  Nonsteroidal anti-inflammatory medicines (NSAIDs), such as ibuprofen or naproxen.  Vitamin E. Dietary Considerations  Foods that have moderate or high amounts of vitamin K can interfere with warfarin. Avoid major changes in your diet or notify your health care provider before changing your diet. Eat a consistent amount of foods that have moderate or high amounts of vitamin K. Eating less foods containing vitamin K can increase the risk of bleeding. Eating more foods containing vitamin K can increase the risk of blood clots. Additional questions about dietary considerations can be discussed with a dietitian. Foods that are very high in vitamin K:  Greens, such as Swiss chard and beet, collard, mustard, or turnip greens (fresh or frozen, cooked).  Kale (fresh or frozen, cooked).  Parsley (raw).  Spinach (cooked). Foods that are   in vitamin K:  Asparagus (frozen, cooked).  Broccoli.  Bok choy (cooked).  Brussels sprouts (fresh or frozen, cooked).  Cabbage (cooked).   Coleslaw. Foods that are moderately high in vitamin K:  Blueberries.  Black-eyed peas.  Endive (raw).  Green leaf lettuce (raw).  Green scallions (raw).  Kale (raw).  Okra (frozen, cooked).  Plantains (fried).  Romaine lettuce (raw).  Sauerkraut (canned).  Spinach (raw). CALL YOUR CLINIC OR HEALTH CARE PROVIDER IF YOU:  Plan to have any surgery or procedure.  Feel sick, especially if you have diarrhea or vomiting.  Experience or anticipate any major changes  in your diet.  Start or stop a prescription or over-the-counter medicine.  Become, plan to become, or think you may be pregnant.  Are having heavier than usual menstrual periods.  Have had a fall, accident, or any symptoms of bleeding or unusual bruising.  Develop an unusual fever. CALL 911 IN THE [place] OR GO TO THE EMERGENCY DEPARTMENT IF YOU:   Think you may be having an allergic reaction to warfarin. The signs of an allergic reaction could include itching, rash, hives, swelling, chest tightness, or trouble breathing.  See signs of blood in your urine. The signs could include reddish, pinkish, or tea-colored urine.  See signs of blood in your stools. The signs could include bright red or black stools.  Vomit or cough up blood. In these instances, the blood could have either a bright red or a "coffee-grounds" appearance.  Have bleeding that will not stop after applying pressure for 30 minutes such as cuts, nosebleeds, or other injuries.  Have severe pain in your joints or back.  Have a new and severe headache.  Have sudden weakness or numbness of your face, arm, or leg, especially on one side of your body.  Have sudden confusion or trouble understanding.  Have sudden trouble seeing in one or both eyes.  Have sudden trouble walking, dizziness, loss of balance, or coordination.  Have trouble speaking or understanding (aphasia).   This information is not intended to replace advice given to you by your health care provider. Make sure you discuss any questions you have with your health care provider.   Document Released: 04/07/2005 Document Revised: 04/28/2014 Document Reviewed: 10/01/2012 Elsevier Interactive Patient Education 2016 [place]. Endoscopic Saphenous Vein Harvesting, Care After Refer to this sheet in the next few weeks. These instructions provide you with information on caring for yourself after your procedure. Your health care provider may also give you more  specific instructions. Your treatment has been planned according to current medical practices, but problems sometimes occur. Call your health care provider if you have any problems or questions after your procedure. HOME CARE INSTRUCTIONS Medicine  Take whatever pain medicine your surgeon prescribes. Follow the directions carefully. Do not take over-the-counter pain medicine unless your surgeon says it is okay. Some pain medicine can cause bleeding problems for several weeks after surgery.  Follow your surgeon's instructions about driving. You will probably not be permitted to drive after heart surgery.  Take any medicines your surgeon prescribes. Any medicines you took before your heart surgery should be checked with your health care provider before you start taking them again. Wound care  If your surgeon has prescribed an elastic bandage or stocking, ask how long you should wear it.  Check the area around your surgical cuts (incisions) whenever your bandages (dressings) are changed. Look for any redness or swelling.  You will need to return to have the stitches (  sutures) or staples taken out. Ask your surgeon when to do that.  Ask your surgeon when you can shower or bathe. Activity  Try to keep your legs raised when you are sitting.  Do any exercises your health care providers have given you. These may include deep breathing exercises, coughing, walking, or other exercises. SEEK MEDICAL CARE IF:  You have any questions about your medicines.  You have more leg pain, especially if your pain medicine stops working.  New or growing bruises develop on your leg.  Your leg swells, feels tight, or becomes red.  You have numbness in your leg. SEEK IMMEDIATE MEDICAL CARE IF:  Your pain gets much worse.  Blood or fluid leaks from any of the incisions.  Your incisions become warm, swollen, or red.  You have chest pain.  You have trouble breathing.  You have a fever.  You have  more pain near your leg incision. MAKE SURE YOU:  Understand these instructions.  Will watch your condition.  Will get help right away if you are not doing well or get worse.   This information is not intended to replace advice given to you by your health care provider. Make sure you discuss any questions you have with your health care provider.   Document Released: 12/18/2010 Document Revised: 04/28/2014 Document Reviewed: 12/18/2010 Elsevier Interactive Patient Education 2016 [employer] Coronary Artery Bypass Grafting, Care After These instructions give you information on caring for yourself after your procedure. Your doctor may also give you more specific instructions. Call your doctor if you have any problems or questions after your procedure.  HOME CARE  Only take medicine as told by your doctor. Take medicines exactly as told. Do not stop taking medicines or start any new medicines without talking to your doctor first.  Take your pulse as told by your doctor.  Do deep breathing as told by your doctor. Use your breathing device (incentive spirometer), if given, to practice deep breathing several times a day. Support your chest with a pillow or your arms when you take deep breaths or cough.  Keep the area clean, dry, and protected where the surgery cuts (incisions) were made. Remove bandages (dressings) only as told by your doctor. If strips were applied to surgical area, do not take them off. They fall off on their own.  Check the surgery area daily for puffiness (swelling), redness, or leaking fluid.  If surgery cuts were made in your legs:  Avoid crossing your legs.  Avoid sitting for long periods of time. Change positions every 30 minutes.  Raise your legs when you are sitting. Place them on pillows.  Wear stockings that help keep blood clots from forming in your legs (compression stockings).  Only take sponge baths until your doctor says it is okay to take showers.  Pat the surgery area dry. Do not rub the surgery area with a washcloth or towel. Do not bathe, swim, or use a hot tub until your doctor says it is okay.  Eat foods that are high in fiber. These include raw fruits and vegetables, whole grains, beans, and nuts. Choose lean meats. Avoid canned, processed, and fried foods.  Drink enough fluids to keep your pee (urine) clear or pale yellow.  Weigh yourself every day.  Rest and limit activity as told by your doctor. You may be told to:  Stop any activity if you have chest pain, shortness of breath, changes in heartbeat, or dizziness. Get help right away if this  happens.  Move around often for short amounts of time or take short walks as told by your doctor. Gradually become more active. You may need help to strengthen your muscles and build endurance.  Avoid lifting, pushing, or pulling anything heavier than 10 pounds (4.5 kg) for at least 6 weeks after surgery.  Do not drive until your doctor says it is okay.  Ask your doctor when you can go back to work.  Ask your doctor when you can begin sexual activity again.  Follow up with your doctor as told. GET HELP IF:  You have puffiness, redness, more pain, or fluid draining from the incision site.  You have a fever.  You have puffiness in your ankles or legs.  You have pain in your legs.  You gain 2 or more pounds (0.9 kg) a day.  You feel sick to your stomach (nauseous) or throw up (vomit).  You have watery poop (diarrhea). GET HELP RIGHT AWAY IF:  You have chest pain that goes to your jaw or arms.  You have shortness of breath.  You have a fast or irregular heartbeat.  You notice a "clicking" in your breastbone when you move.  You have numbness or weakness in your arms or legs.  You feel dizzy or light-headed. MAKE SURE YOU:  Understand these instructions.  Will watch your condition.  Will get help right away if you are not doing well or get worse.   This  information is not intended to replace advice given to you by your health care provider. Make sure you discuss any questions you have with your health care provider.   Document Released: 04/12/2013 Document Reviewed: 04/12/2013 Elsevier Interactive Patient Education 2016 [employer] Atrial Fibrillation Atrial fibrillation is a type of irregular or rapid heartbeat (arrhythmia). In atrial fibrillation, the heart quivers continuously in a chaotic pattern. This occurs when parts of the heart receive disorganized signals that make the heart unable to pump blood normally. This can increase the risk for stroke, heart failure, and other heart-related conditions. There are different types of atrial fibrillation, including:  Paroxysmal atrial fibrillation. This type starts suddenly, and it usually stops on its own shortly after it starts.  Persistent atrial fibrillation. This type often lasts longer than a week. It may stop on its own or with treatment.  Long-lasting persistent atrial fibrillation. This type lasts longer than 12 months.  Permanent atrial fibrillation. This type does not go away. Talk with your health care provider to learn about the type of atrial fibrillation that you have. CAUSES This condition is caused by some heart-related conditions or procedures, including:  A heart attack.  Coronary artery disease.  Heart failure.  Heart valve conditions.  High blood pressure.  Inflammation of the sac that surrounds the heart (pericarditis).  Heart surgery.  Certain heart rhythm disorders, such as Wolf-Parkinson-White syndrome. Other causes include:  Pneumonia.  Obstructive sleep apnea.  Blockage of an artery in the lungs (pulmonary embolism, or PE).  Lung cancer.  Chronic lung disease.  Thyroid problems, especially if the thyroid is overactive (hyperthyroidism).  Caffeine.  Excessive alcohol use or illegal drug use.  Use of some medicines, including certain  decongestants and diet pills. Sometimes, the cause cannot be found. RISK FACTORS This condition is more likely to develop in:  People who are older in age.  People who smoke.  People who have diabetes mellitus.  People who are overweight (obese).  Athletes who exercise vigorously. SYMPTOMS Symptoms of this condition  include:  A feeling that your heart is beating rapidly or irregularly.  A feeling of discomfort or pain in your chest.  Shortness of breath.  Sudden light-headedness or weakness.  Getting tired easily during exercise. In some cases, there are no symptoms. DIAGNOSIS Your health care provider may be able to detect atrial fibrillation when taking your pulse. If detected, this condition may be diagnosed with:  An electrocardiogram (ECG).  A Holter monitor test that records your heartbeat patterns over a 24-hour period.  Transthoracic echocardiogram (TTE) to evaluate how blood flows through your heart.  Transesophageal echocardiogram (TEE) to view more detailed images of your heart.  A stress test.  Imaging tests, such as a CT scan or chest X-ray.  Blood tests. TREATMENT The main goals of treatment are to prevent blood clots from forming and to keep your heart beating at a normal rate and rhythm. The type of treatment that you receive depends on many factors, such as your underlying medical conditions and how you feel when you are experiencing atrial fibrillation. This condition may be treated with:  Medicine to slow down the heart rate, bring the heart's rhythm back to normal, or prevent clots from forming.  Electrical cardioversion. This is a procedure that resets your heart's rhythm by delivering a controlled, low-energy shock to the heart through your skin.  Different types of ablation, such as catheter ablation, catheter ablation with pacemaker, or surgical ablation. These procedures destroy the heart tissues that send abnormal signals. When the  pacemaker is used, it is placed under your skin to help your heart beat in a regular rhythm. HOME CARE INSTRUCTIONS  Take over-the counter and prescription medicines only as told by your health care provider.  If your health care provider prescribed a blood-thinning medicine (anticoagulant), take it exactly as told. Taking too much blood-thinning medicine can cause bleeding. If you do not take enough blood-thinning medicine, you will not have the protection that you need against stroke and other problems.  Do not use tobacco products, including cigarettes, chewing tobacco, and e-cigarettes. If you need help quitting, ask your health care provider.  If you have obstructive sleep apnea, manage your condition as told by your health care provider.  Do not drink alcohol.  Do not drink beverages that contain caffeine, such as coffee, soda, and tea.  Maintain a healthy weight. Do not use diet pills unless your health care provider approves. Diet pills may make heart problems worse.  Follow diet instructions as told by your health care provider.  Exercise regularly as told by your health care provider.  Keep all follow-up visits as told by your health care provider. This is important. PREVENTION  Avoid drinking beverages that contain caffeine or alcohol.  Avoid certain medicines, especially medicines that are used for breathing problems.  Avoid certain herbs and herbal medicines, such as those that contain ephedra or ginseng.  Do not use illegal drugs, such as cocaine and amphetamines.  Do not smoke.  Manage your high blood pressure. SEEK MEDICAL CARE IF:  You notice a change in the rate, rhythm, or strength of your heartbeat.  You are taking an anticoagulant and you notice increased bruising.  You tire more easily when you exercise or exert yourself. SEEK IMMEDIATE MEDICAL CARE IF:  You have chest pain, abdominal pain, sweating, or weakness.  You feel nauseous.  You notice  blood in your vomit, bowel movement, or urine.  You have shortness of breath.  You suddenly have swollen feet and  ankles.  You feel dizzy.  You have sudden weakness or numbness of the face, arm, or leg, especially on one side of the body.  You have trouble speaking, trouble understanding, or both (aphasia).  Your face or your eyelid droops on one side. These symptoms may represent a serious problem that is an emergency. Do not wait to see if the symptoms will go away. Get medical help right away. Call your local emergency services (911 in the [place] Do not drive yourself to the hospital.   This information is not intended to replace advice given to you by your health care provider. Make sure you discuss any questions you have with your health care provider.   Document Released: 04/07/2005 Document Revised: 12/27/2014 Document Reviewed: 08/02/2014 Elsevier Interactive Patient Education [employer]. ----------------------------------------------  Information on my medicine - Coumadin   (Warfarin)  This medication education was reviewed with me or my healthcare representative as part of my discharge preparation.  The pharmacist that spoke with me during my hospital stay was:  Dareen Piano, [facility]  Why was Coumadin prescribed for you? Coumadin was prescribed for you because you have a blood clot or a medical condition that can cause an increased risk of forming blood clots. Blood clots can cause serious health problems by blocking the flow of blood to the heart, lung, or brain. Coumadin can prevent harmful blood clots from forming. As a reminder your indication for Coumadin is:   Stroke Prevention Because Of Atrial Fibrillation  What test will check on my response to Coumadin? While on Coumadin (warfarin) you will need to have an INR test regularly to ensure that your dose is keeping you in the desired range. The INR (international normalized ratio) number is calculated from the  result of the laboratory test called prothrombin time (PT).  If an INR APPOINTMENT HAS NOT ALREADY BEEN MADE FOR YOU please schedule an appointment to have this lab work done by your health care provider within 7 days. Your INR goal is usually a number between:  2 to 3 or your provider may give you a more narrow range like 2-2.5.  Ask your health care provider during an office visit what your goal INR is.  What  do you need to  know  About  COUMADIN? Take Coumadin (warfarin) exactly as prescribed by your healthcare provider about the same time each day.  DO NOT stop taking without talking to the doctor who prescribed the medication.  Stopping without other blood clot prevention medication to take the place of Coumadin may increase your risk of developing a new clot or stroke.  Get refills before you run out.  What do you do if you miss a dose? If you miss a dose, take it as soon as you remember on the same day then continue your regularly scheduled regimen the next day.  Do not take two doses of Coumadin at the same time.  Important Safety Information A possible side effect of Coumadin (Warfarin) is an increased risk of bleeding. You should call your healthcare provider right away if you experience any of the following: ? Bleeding from an injury or your nose that does not stop. ? Unusual colored urine (red or dark brown) or unusual colored stools (red or black). ? Unusual bruising for unknown reasons. ? A serious fall or if you hit your head (even if there is no bleeding).  Some foods or medicines interact with Coumadin (warfarin) and might alter your response to  warfarin. To help avoid this: ? Eat a balanced diet, maintaining a consistent amount of Vitamin K. ? Notify your provider about major diet changes you plan to make. ? Avoid alcohol or limit your intake to 1 drink for women and 2 drinks for men per day. (1 drink is 5 oz. wine, 12 oz. beer, or 1.5 oz. liquor.)  Make sure that ANY  health care provider who prescribes medication for you knows that you are taking Coumadin (warfarin).  Also make sure the healthcare provider who is monitoring your Coumadin knows when you have started a new medication including herbals and non-prescription products.  Coumadin (Warfarin)  Major Drug Interactions  Increased Warfarin Effect Decreased Warfarin Effect  Alcohol (large quantities) Antibiotics (esp. Septra/Bactrim, Flagyl, Cipro) Amiodarone (Cordarone) Aspirin (ASA) Cimetidine (Tagamet) Megestrol (Megace) NSAIDs (ibuprofen, naproxen, etc.) Piroxicam (Feldene) Propafenone (Rythmol SR) Propranolol (Inderal) Isoniazid (INH) Posaconazole (Noxafil) Barbiturates (Phenobarbital) Carbamazepine (Tegretol) Chlordiazepoxide (Librium) Cholestyramine (Questran) Griseofulvin Oral Contraceptives Rifampin Sucralfate (Carafate) Vitamin K   Coumadin (Warfarin) Major Herbal Interactions  Increased Warfarin Effect Decreased Warfarin Effect  Garlic Ginseng Ginkgo biloba Coenzyme Q10 Green tea St. Johns wort    Coumadin (Warfarin) FOOD Interactions  Eat a consistent number of servings per week of foods HIGH in Vitamin K (1 serving =  cup)  Collards (cooked, or boiled & drained) Kale (cooked, or boiled & drained) Mustard greens (cooked, or boiled & drained) Parsley *serving size only =  cup Spinach (cooked, or boiled & drained) Swiss chard (cooked, or boiled & drained) Turnip greens (cooked, or boiled & drained)  Eat a consistent number of servings per week of foods MEDIUM-HIGH in Vitamin K (1 serving = 1 cup)  Asparagus (cooked, or boiled & drained) Broccoli (cooked, boiled & drained, or raw & chopped) Brussel sprouts (cooked, or boiled & drained) *serving size only =  cup Lettuce, raw (green leaf, endive, romaine) Spinach, raw Turnip greens, raw & chopped   These websites have more information on Coumadin (warfarin):   FailFactory.se; VeganReport.com.au;

## 2015-08-17 NOTE — Discharge Summary (Signed)
Physician Discharge Summary  Patient ID: [name] [name] MRN: [id number] DOB/AGE: [birth year] 69 y.o.  Admit date: 08/13/2015 Discharge date: 08/18/2015  Admission Diagnoses:CAD  Discharge Diagnoses:  Active Problems:   S/P CABG x 4  Patient Active Problem List   Diagnosis Date Noted  . S/P CABG x 4 08/13/2015  . CAD (coronary artery disease), native coronary artery 08/03/2015  . Diabetes mellitus type 2 with complications ([place]) [id number]  . Hyperlipidemia 08/03/2015  . Essential hypertension 08/03/2015  . Atrial fibrillation ([place])   . Tachycardia     HPI:  The patient is a 70 year old gentleman with a history of DM, hyperlipidemia, hypertension and a family history of premature coronary disease with his father dying at 22 from MI who began having shortness of breath, left arm dull aching pain and diaphoresis in December. He had a couple episodes in Dec and Jan and then a few more in Feb and March. His wife reports that he has been fatigued. He had a high risk myoview and catheterization showed severe multivessel CAD with a 90% proximal LAD, 100% proximal LCX and 99% proximal to mid RCA. Echo showed an EF of 60-65% with no significant valvular dysfunction. He denies any symptoms in about 4 weeks.   He was seen in consultation by Dr. Cyndia Bent who evaluated the patient and his studies and agreed with recommendations for coronary artery surgical revascularization. He was admitted this hospitalization for the procedure.  Discharged Condition: good  Hospital Course: The patient was admitted electively and taken to the operating room where he underwent the below described procedure. He tolerated it well and was taken to the surgical intensive care unit in stable condition.  Postoperatively the patient has overall done well. He has had postoperative atrial fibrillation which has subsequently been chemically cardioverted to sinus rhythm with amiodarone and beta blocker. He is also been started  on Coumadin for this. He has remained hemodynamically stable. All routine lines, monitors and drainage devices have been discontinued in standard fashion. Incisions are noted to be healing well without evidence of infection. He is tolerating gradually increasing activities using standard protocols. He does have a expected acute blood loss anemia which is stable. He did have some volume overload which has responded well to diuretics. Currently his overall status is felt to be tentatively stable for discharge  Consults: None  Significant Diagnostic Studies: routine labs and Serial CXR's post op  Treatments: surgery:   08/13/2015  Surgeon: Gaye Pollack, MD  First Assistant: Jadene Pierini, PA-C   Preoperative Diagnosis: Severe multi-vessel coronary artery disease   Postoperative Diagnosis: Same   Procedure:  1. Median Sternotomy 2. Extracorporeal circulation 3. Coronary artery bypass grafting x 4   Left internal mammary graft to the LAD  Sequential SVG to PDA and PL1  SVG to OM  4. Endoscopic vein harvest from the right leg   Anesthesia: General Endotracheal   Discharge Exam: Blood pressure 162/76, pulse 66, temperature 98.4 F (36.9 C), temperature source Oral, resp. rate 20, height 5\' 7"  (1.702 m), weight 183 lb 11.2 oz (83.326 kg), SpO2 95 %.  General appearance: alert, cooperative and no distress Heart: regular rate and rhythm Lungs: mildly dim in bases Abdomen: benign Extremities: trace edema Wound: incis healing well  Disposition: 01-Home or Self Care      Discharge Instructions    AMB Referral to [facility]    Complete by:  As directed   Please assign to [facility] for transition  of care. S/p CABG. History of HLD, HTN, DM. Written consent obtained. [name] (wife) requests to be called for post transition of care calls and the like at home [phone] or cell at [phone]. Please call with questions. Currently hospitalized at  [facility]. Thanks. Marthenia Rolling, [place], [facility] Liaison [phone]  Reason for consult:  Please assign to [facility] RNCM  Diagnoses of:  Diabetes  Expected date of contact:  1-3 days (reserved for hospital discharges)     Amb Referral to Cardiac Rehabilitation    Complete by:  As directed   Diagnosis:  CABG  CABG X ___:  4            Medication List    STOP taking these medications        ibuprofen 200 MG tablet  Commonly known as:  ADVIL,MOTRIN      TAKE these medications        amiodarone 200 MG tablet  Commonly known as:  PACERONE  Take 1 tablet (200 mg total) by mouth 2 (two) times daily.     aspirin 81 MG tablet  Take 81 mg by mouth daily.     B-12 2500 MCG Tabs  Take 1 tablet by mouth 3 (three) times a week.     metFORMIN 500 MG 24 hr tablet  Commonly known as:  GLUCOPHAGE-XR  Take 1,000 mg by mouth daily.     metoprolol tartrate 25 MG tablet  Commonly known as:  LOPRESSOR  Take 1 tablet (25 mg total) by mouth 3 (three) times daily.     olmesartan 40 MG tablet  Commonly known as:  BENICAR  Take 40 mg by mouth daily after supper.     oxyCODONE 5 MG immediate release tablet  Commonly known as:  Oxy IR/ROXICODONE  Take 1-2 tablets (5-10 mg total) by mouth every 6 (six) hours as needed for severe pain.     simvastatin 20 MG tablet  Commonly known as:  ZOCOR  Take 20 mg by mouth daily.     triamcinolone cream 0.1 %  Commonly known as:  KENALOG  Apply 1 application topically 2 (two) times daily as needed.     Vitamin D 2000 units tablet  Take 2,000 Units by mouth 3 (three) times a week.     warfarin 2.5 MG tablet  Commonly known as:  COUMADIN  Take 1 tablet (2.5 mg total) by mouth daily. And as directed by the coumadin clinic       Follow-up Information    Follow up with Gaye Pollack, MD.   Specialty:  Cardiothoracic Surgery   Why:  09/12/2015 at 9:30 AM to see the surgeon. Please obtain a chest x-ray at [facility] at 9  AM. [facility] is located in the same office complex as the Psychologist, sport and exercise.   Contact information:   [address] [place] [address] [phone]       Follow up with [facility].   Specialty:  Cardiothoracic Surgery   Why:  nurse appt for suture removal on 08/24/2015 at 10 AM.   Contact information:   [place], [place] [phone]      Follow up with Sinclair Grooms, MD.   Specialty:  Cardiology   Why:  2 week cardiology follow-up. Please contact the office to arrange if they do not contact you in the next couple days.   Contact information:  [place]. [place] [address] [phone]       Follow up with [facility].   Specialty:  Cardiology   Why:  next tues or wed- office will contact you to check blood test to adjust coumadin dose. If they do not contact you, please call to arrange this appointment.   Contact information:   [address] [place] [phone]    The patient has been discharged on:   1.Beta Blocker:  Yes [ y  ]                              No   [   ]                              If No, reason:  2.Ace Inhibitor/ARB: Yes [ y  ]                                     No  [    ]                                     If No, reason:  3.Statin:   Yes Blue.Reese   ]                  No  [   ]                  If No, reason:  4.Ecasa:  Yes  [ y  ]                  No   [   ]                  If No, reason:   Signed: GOLD,WAYNE E 08/18/2015, 10:38 AM

## 2015-08-18 LAB — BASIC METABOLIC PANEL
Anion gap: 11 (ref 5–15)
BUN: 17 mg/dL (ref 6–20)
CO2: 24 mmol/L (ref 22–32)
CREATININE: 1.14 mg/dL (ref 0.61–1.24)
Calcium: 9.1 mg/dL (ref 8.9–10.3)
Chloride: 104 mmol/L (ref 101–111)
GFR calc Af Amer: >60 mL/min (ref >60)
GLUCOSE: 122 mg/dL — AB (ref 65–99)
Potassium: 4.1 mmol/L (ref 3.5–5.1)
SODIUM: 139 mmol/L (ref 135–145)

## 2015-08-18 LAB — PROTIME-INR
INR: 1.48 (ref 0.00–1.49)
PROTHROMBIN TIME: 18 s — AB (ref 11.6–15.2)
Prothrombin Time: >90 seconds — ABNORMAL HIGH (ref 11.6–15.2)

## 2015-08-18 LAB — GLUCOSE, CAPILLARY
Glucose-Capillary: 130 mg/dL — ABNORMAL HIGH (ref 65–99)
Glucose-Capillary: 135 mg/dL — ABNORMAL HIGH (ref 65–99)

## 2015-08-18 MED ORDER — AMIODARONE HCL 200 MG PO TABS
200.0000 mg | ORAL_TABLET | Freq: Two times a day (BID) | ORAL | Status: DC
  Administered 2015-08-18: 200 mg via ORAL
  Filled 2015-08-18: qty 1

## 2015-08-18 MED ORDER — IRBESARTAN 300 MG PO TABS
300.0000 mg | ORAL_TABLET | Freq: Every day | ORAL | Status: DC
  Administered 2015-08-18: 300 mg via ORAL
  Filled 2015-08-18: qty 1

## 2015-08-18 MED ORDER — WARFARIN SODIUM 2.5 MG PO TABS: 2.5000 mg | ORAL_TABLET | Freq: Every day | ORAL | Status: DC

## 2015-08-18 MED ORDER — OXYCODONE HCL 5 MG PO TABS: 5.0000 mg | ORAL_TABLET | Freq: Four times a day (QID) | ORAL | Status: DC | PRN

## 2015-08-18 MED ORDER — METOPROLOL TARTRATE 25 MG PO TABS: 25.0000 mg | ORAL_TABLET | Freq: Three times a day (TID) | ORAL | Status: DC

## 2015-08-18 MED ORDER — AMIODARONE HCL 200 MG PO TABS: 200.0000 mg | ORAL_TABLET | Freq: Two times a day (BID) | ORAL | Status: DC

## 2015-08-18 NOTE — Addendum Note (Signed)
Addendum  created 08/18/15 1440 by Roberts Gaudy, MD   Modules edited: Notes Section   Notes Section:  File: [id number]; Pend: [id number]; Pend: [id number]

## 2015-08-18 NOTE — Progress Notes (Signed)
Pt discharged with prescriptions and AVS in hand. Reviewed post op care education with him and his wife. No questions. Pt discharged home with his wife.

## 2015-08-18 NOTE — Progress Notes (Signed)
Anesthesiology Follow-up:  Doing well, now 5 days S/P CABG X 4. Now ready for discharge. Stable post-op course, extubated 9 1/2 hours after surgery. Developed transient  AFib on POD #1 now in [place]. No apparent anesthetic complications, home today.  Roberts Gaudy

## 2015-08-18 NOTE — Progress Notes (Signed)
Pt blood pressure elevated this am, 164/79 at 0509, pt reported to RN that he recently went to the bathroom, RN went back to recheck at 0600 and blood pressure still elevated at 162/76, pt asymptomatic, pt denies pain, RN will continue to monitor and report information to oncoming RN, pt call bell in reach and bed in lowest position.    Izola Price, RN 08/18/2015

## 2015-08-18 NOTE — Progress Notes (Signed)
CRITICAL VALUE ALERT  Critical value received:  PT greater than 90 and INR greater than 10  Date of notification:  08/18/2015  Time of notification:  0613  Critical value read back: Yes  Nurse who received alert:  Izola Price, RN    RN asked for labs to be re-drawn based on previous electronic order, RN will pass this information on to oncoming RN, if verified to be abnormal MD will be notified by oncoming RN, pt asymptomatic, pt call bell within reach and bed in lowest position.      Izola Price, RN 08/18/2015

## 2015-08-18 NOTE — Progress Notes (Addendum)
[address]       [place] [address]             [phone]      5 Days Post-Op Procedure(s) (LRB): CORONARY ARTERY BYPASS GRAFTING (CABG) x 4 using left internal mammary artery and right leg saphenous vein (N/A) TRANSESOPHAGEAL ECHOCARDIOGRAM (TEE) (N/A) Subjective: Feels fine In sinus with no further afib  Objective: Vital signs in last 24 hours: Temp:  [98.2 F (36.8 C)-98.4 F (36.9 C)] 98.4 F (36.9 C) (04/29 0509) Pulse Rate:  [66-90] 66 (04/29 0600) Cardiac Rhythm:  [-] Normal sinus rhythm (04/28 1900) Resp:  [18-20] 20 (04/29 0509) BP: (134-164)/(67-79) 162/76 mmHg (04/29 0600) SpO2:  [95 %-99 %] 95 % (04/29 0509) Weight:  [183 lb 11.2 oz (83.326 kg)] 183 lb 11.2 oz (83.326 kg) (04/29 0701)  Hemodynamic parameters for last 24 hours:    Intake/Output from previous day: 04/28 0701 - 04/29 0700 In: 1200 [P.O.:1200] Out: -  Intake/Output this shift:    General appearance: alert, cooperative and no distress Heart: regular rate and rhythm Lungs: mildly dim in  bases Abdomen: benign Extremities: trace edema Wound: incis healing well  Lab Results:  Recent Labs  08/16/15 0300  WBC 8.2  HGB 10.2*  HCT 30.1*  PLT 127*   BMET:  Recent Labs  08/17/15 0426 08/18/15 0451  NA 139 139  K 4.5 4.1  CL 101 104  CO2 28 24  GLUCOSE 110* 122*  BUN 19 17  CREATININE 1.25* 1.14  CALCIUM 8.9 9.1    PT/INR:  Recent Labs  08/18/15 0451  LABPROT >90.0*  INR >10.00*   ABG    Component Value Date/Time   PHART 7.353 08/13/2015 2348   HCO3 22.8 08/13/2015 2348   TCO2 24 08/13/2015 2348   ACIDBASEDEF 2.0 08/13/2015 2348   O2SAT 96.0 08/13/2015 2348   CBG (last 3)   Recent Labs  08/17/15 1700 08/17/15 2045 08/18/15 0634  GLUCAP 131* 154* 130*    Meds Scheduled Meds: . amiodarone  400 mg Oral BID  . antiseptic oral rinse  7 mL Mouth Rinse 10 times per day  . aspirin EC  81 mg Oral Daily  . chlorhexidine gluconate (SAGE  KIT)  15 mL Mouth Rinse BID  . docusate sodium  200 mg Oral Daily  . enoxaparin (LOVENOX) injection  40 mg Subcutaneous QHS  . insulin aspart  0-24 Units Subcutaneous TID AC & HS  . insulin detemir  20 Units Subcutaneous Daily  . lisinopril  2.5 mg Oral Daily  . metoprolol tartrate  25 mg Oral TID  . pantoprazole  40 mg Oral QAC breakfast  . simvastatin  20 mg Oral Daily  . sodium chloride flush  3 mL Intravenous Q12H  . Warfarin - Physician Dosing Inpatient   Does not apply q1800   Continuous Infusions:  PRN Meds:.sodium chloride, acetaminophen, bisacodyl **OR** bisacodyl, ondansetron **OR** ondansetron (ZOFRAN) IV, oxyCODONE, sodium chloride flush, traMADol  Xrays No results found.  Assessment/Plan: S/P Procedure(s) (LRB): CORONARY ARTERY BYPASS GRAFTING (CABG) x 4 using left internal mammary artery and right leg saphenous vein (N/A) TRANSESOPHAGEAL ECHOCARDIOGRAM (TEE) (N/A)  1 INR result in question and being repeated- await result, decrease amio to 200 bid 2 resume home  Benicar and stop lisinopril 3 poss home today   LOS: 5 days    GOLD,WAYNE E 08/18/2015  Patient seen and examined, agree with above INR was 1.48 on repeat Home today  Steven C. Hendrickson, MD [facility] [phone]  

## 2015-08-18 NOTE — Progress Notes (Signed)
CARDIAC REHAB PHASE I   Spoke with pt, he states he has walked independently twice this morning, declines additional ambulation at this time. Discharge education completed yesterday, phase 2 cardiac rehab referral sent to [facility]. Pt states he has no questions regarding education at this time. Pt in bed, call bell within reach, awaiting discharge.   Lenna Sciara, RN, BSN 08/18/2015 10:53 AM

## 2015-08-20 ENCOUNTER — Ambulatory Visit (INDEPENDENT_AMBULATORY_CARE_PROVIDER_SITE_OTHER): Payer: PPO | Admitting: Pharmacist

## 2015-08-20 ENCOUNTER — Other Ambulatory Visit: Payer: Self-pay

## 2015-08-20 DIAGNOSIS — Z951 Presence of aortocoronary bypass graft: Secondary | ICD-10-CM

## 2015-08-20 DIAGNOSIS — I4891 Unspecified atrial fibrillation: Secondary | ICD-10-CM | POA: Diagnosis not present

## 2015-08-20 DIAGNOSIS — Z5181 Encounter for therapeutic drug level monitoring: Secondary | ICD-10-CM | POA: Diagnosis not present

## 2015-08-20 LAB — POCT INR: INR: 2.1

## 2015-08-20 NOTE — Patient Outreach (Signed)
[facility]) Care Management  08/20/2015  EVERT GRAVELLE [birth year] [id number]  70 year old admitted 4/24-4/29 coronary artery bypass graft, atrial fibrillation. Member was not available-taking a nap at this time. RNCM left message with person answering phone.  Plan: follow up call tomorrow.  Thea Silversmith, RN, MSN, [place] Coordinator Cell: [phone]

## 2015-08-21 ENCOUNTER — Other Ambulatory Visit: Payer: Self-pay

## 2015-08-21 NOTE — Patient Outreach (Signed)
[facility]) Care Management  08/21/2015  [name] [name] Mar 08, 1946 [id number]  RNCM called to complete transition of care call post coronary artery bypass grafting. Member denies any questions or issues. Member denies any pain. He has his follow up appointments scheduled and transportation.  Medications reviewed. Member manages his own medications and has all his medicines.  Plan: continue weekly transition of care engagements.  Thea Silversmith, RN, MSN, [place] Coordinator Cell: [phone]

## 2015-08-24 ENCOUNTER — Ambulatory Visit (INDEPENDENT_AMBULATORY_CARE_PROVIDER_SITE_OTHER): Payer: Self-pay | Admitting: *Deleted

## 2015-08-24 DIAGNOSIS — Z951 Presence of aortocoronary bypass graft: Secondary | ICD-10-CM

## 2015-08-24 DIAGNOSIS — I2510 Atherosclerotic heart disease of native coronary artery without angina pectoris: Secondary | ICD-10-CM

## 2015-08-24 DIAGNOSIS — Z4802 Encounter for removal of sutures: Secondary | ICD-10-CM

## 2015-08-24 NOTE — Progress Notes (Signed)
[name] [name] returns s/p CABG X 4 for suture removal of his previous chest tube sites. The sutures were easily removed. These sites as well as his sternal incision and right leg evh site are all very well healed.  There is no lower leg swelling. Appetite and bowels are normal.  He is walking every day.  His INR is being monitored by [facility]/Dr. Tamala Julian. He has a follow up scheduled with Dr. Tamala Julian soon. He does c/o some shortness of breath with exertion.  His lungs are CTA all fields.  He was reassured.  He will return as scheduled with a CXR.

## 2015-08-27 ENCOUNTER — Other Ambulatory Visit: Payer: Self-pay

## 2015-08-27 ENCOUNTER — Ambulatory Visit (INDEPENDENT_AMBULATORY_CARE_PROVIDER_SITE_OTHER): Payer: PPO | Admitting: *Deleted

## 2015-08-27 DIAGNOSIS — Z951 Presence of aortocoronary bypass graft: Secondary | ICD-10-CM | POA: Diagnosis not present

## 2015-08-27 DIAGNOSIS — I4891 Unspecified atrial fibrillation: Secondary | ICD-10-CM

## 2015-08-27 DIAGNOSIS — Z5181 Encounter for therapeutic drug level monitoring: Secondary | ICD-10-CM | POA: Diagnosis not present

## 2015-08-27 LAB — POCT INR: INR: 3.1

## 2015-08-27 NOTE — Patient Outreach (Signed)
[facility]) Care Management  08/27/2015  [name] [name] March 15, 1946 [id number]  RNCM called for transition of care. No answer. HIPPA compliant message left.   Plan: RNCM will continue to call weekly for transition of care engagements.  Thea Silversmith, RN, MSN, [place] Coordinator Cell: [phone]

## 2015-08-29 ENCOUNTER — Encounter: Payer: Self-pay | Admitting: *Deleted

## 2015-08-29 MED FILL — SIMVASTATIN 20 MG TABLET: 20 | 90 days supply | Qty: 90 | Fill #1

## 2015-08-29 MED FILL — METFORMIN HCL ER 500 MG TAB: 500 | 30 days supply | Qty: 60 | Fill #2

## 2015-08-30 ENCOUNTER — Other Ambulatory Visit: Payer: Self-pay

## 2015-08-30 NOTE — Patient Outreach (Signed)
[facility]) Care Management  08/30/2015  DEVEREAUX STALLS 10-06-1945 [id number]  Transition of care call. Denies pain. Member reports he has been walking without issues. Upcoming office visits discussed. Member has transportation. No issues or concerns at this time.  Plan: continue to follow for weekly engagements.  Thea Silversmith, RN, MSN, [place] Coordinator Cell: [phone]

## 2015-09-02 NOTE — Progress Notes (Signed)
Cardiology Office Note:    Date:  09/03/2015   ID:  [name] [name], DOB 07/30/1945, MRN [id number]  PCP:  Jonathon Bellows, MD  Cardiologist:  Dr. Jenkins Rouge  >> patient requests Dr. Daneen Schick  Electrophysiologist:  n/a  Referring MD: Maurice Small, MD   Chief Complaint  Patient presents with  . Hospitalization Follow-up    s/p CABG    History of Present Illness:     [name] [name] is a 70 y.o. male with a hx of LBBB, HTN, HL, DM2, FHx CAD. He was recently referred by his PCP for further evaluation of DOE and episodic L arm pain.  He had an echocardiogram which demonstrated normal LV function. He also underwent a Myoview which was high risk. He was set up for cardiac catheterization. This demonstrated severe 3 vessel CAD. CABG was indicated. He was referred to Dr. Cyndia Bent. Patient was admitted 4/24-4/29 and underwent CABG with LIMA-LAD, SVG-PDA/PL 1, SVG-OM. Postoperative course was complicated by atrial fibrillation. He maintained sinus rhythm on amiodarone. He had recurrent episodes of atrial fibrillation was placed on Coumadin.    Returns for follow-up. Since DC, he has been doing well. He's walking couple times a day. He denies significant dyspnea. Chest soreness is resolving. He has a good appetite. Denies orthopnea, PND or edema. Denies syncope. Denies any bleeding issues.    Past Medical History  Diagnosis Date  . Hypertension   . Diabetes mellitus   . HLD (hyperlipidemia)   . BPH (benign prostatic hyperplasia)     [facility]  . Nephrolithiasis   . History of echocardiogram     Echo 4/17: EF 60-65%, no RWMA, Gr 1 DD  . LBBB (left bundle branch block)   . History of nuclear stress test     Lexiscan Myoview 4/17:  Large defect in ant-septal, apical septal and apical wall and large defect in inf-septal inf, inf-lat, inf and apical inf wall c/w scar with peri-infarct ischemia, EF 47%; High Risk  . Shortness of breath dyspnea   . Coronary artery disease     Past  Surgical History  Procedure Laterality Date  . Appendectomy  1976  . Cardiac catheterization N/A 08/03/2015    Procedure: Left Heart Cath and Coronary Angiography;  Surgeon: Belva Crome, MD;  Location: [place] [facility];  Service: Cardiovascular;  Laterality: N/A;  . Colonoscopy    . Coronary artery bypass graft N/A 08/13/2015    Procedure: CORONARY ARTERY BYPASS GRAFTING (CABG) x 4 using left internal mammary artery and right leg saphenous vein;  Surgeon: Gaye Pollack, MD;  Location: [place] OR;  Service: Open Heart Surgery;  Laterality: N/A;  . Tee without cardioversion N/A 08/13/2015    Procedure: TRANSESOPHAGEAL ECHOCARDIOGRAM (TEE);  Surgeon: Gaye Pollack, MD;  Location: [place];  Service: Open Heart Surgery;  Laterality: N/A;    Current Medications: Outpatient Prescriptions Prior to Visit  Medication Sig Dispense Refill  . aspirin 81 MG tablet Take 81 mg by mouth daily.    . Cholecalciferol (VITAMIN D) 2000 units tablet Take 2,000 Units by mouth 3 (three) times a week.     . Cyanocobalamin (B-12) 2500 MCG TABS Take 1 tablet by mouth 3 (three) times a week.    . metFORMIN (GLUCOPHAGE-XR) 500 MG 24 hr tablet Take 1,000 mg by mouth daily.   11  . olmesartan (BENICAR) 40 MG tablet Take 40 mg by mouth daily after supper.     Marland Kitchen oxyCODONE (OXY IR/ROXICODONE)  5 MG immediate release tablet Take 1-2 tablets (5-10 mg total) by mouth every 6 (six) hours as needed for severe pain. 50 tablet 0  . simvastatin (ZOCOR) 20 MG tablet Take 20 mg by mouth daily.    Marland Kitchen triamcinolone cream (KENALOG) 0.1 % Apply 1 application topically 2 (two) times daily as needed.    . warfarin (COUMADIN) 2.5 MG tablet Take 1 tablet (2.5 mg total) by mouth daily. And as directed by the coumadin clinic 100 tablet 1  . amiodarone (PACERONE) 200 MG tablet Take 1 tablet (200 mg total) by mouth 2 (two) times daily. 60 tablet 1  . metoprolol tartrate (LOPRESSOR) 25 MG tablet Take 1 tablet (25 mg total) by mouth 3 (three) times daily.  90 tablet 1   No facility-administered medications prior to visit.      Allergies:   Review of patient's allergies indicates no active allergies.   Social History   Social History  . Marital Status: Married    Spouse Name: N/A  . Number of Children: N/A  . Years of Education: N/A   Social History Main Topics  . Smoking status: Never Smoker   . Smokeless tobacco: None  . Alcohol Use: No  . Drug Use: No  . Sexual Activity: Not Asked   Other Topics Concern  . None   Social History Narrative   Retired   Married; 1 Licensed [employer] x 30 years   Originally from [place] History:  The patient's family history includes Heart attack (age of onset: 65) in his father; Heart disease in his father and mother; Heart failure (age of onset: 36) in his mother.   ROS:   Please see the history of present illness.    ROS All other systems reviewed and are negative.   Physical Exam:    VS:  BP 120/60 mmHg  Pulse 62  Ht 5\' 7"  (1.702 m)  Wt 179 lb (81.194 kg)  BMI 28.03 kg/m2   GEN: Well nourished, well developed, in no acute distress HEENT: normal Neck: no JVD, no masses Cardiac: Normal S1/S2, RRR; no murmurs, rubs, or gallops, no edema;   Chest: Median sternotomy well healed without erythema or discharge    Respiratory:  clear to auscultation bilaterally; no wheezing, rhonchi or rales GI: soft, nontender, nondistended MS: no deformity or atrophy Skin: warm and dry Neuro: No focal deficits  Psych: Alert and oriented x 3, normal affect  Wt Readings from Last 3 Encounters:  09/03/15 179 lb (81.194 kg)  08/18/15 183 lb 11.2 oz (83.326 kg)  08/10/15 188 lb 15 oz (85.7 kg)      Studies/Labs Reviewed:     EKG:  EKG is  ordered today.  The ekg ordered today demonstrates NSR, HR 63, LBBB  Recent Labs: 08/10/2015: ALT 37 08/14/2015: Magnesium 2.0 08/16/2015: Hemoglobin 10.2*; Platelets 127* 08/18/2015: BUN 17; Creatinine, Ser 1.14; Potassium  4.1; Sodium 139   Recent Lipid Panel No results found for: CHOL, TRIG, HDL, CHOLHDL, VLDL, LDLCALC, LDLDIRECT  Additional studies/ records that were reviewed today include:   Carotid US 4/17 Bilat ICA 1-39%  LHC 4/17 1. Prox RCA lesion, 99% stenosed. 2. Prox Cx to Mid Cx lesion, 100% stenosed. 3. Mid LAD lesion, 90% stenosed. 4. Dist LAD lesion, 60% stenosed. Severe three-vessel coronary disease in 70 year old diabetic patient with 90% proximal LAD arising after the second diagonal but before the first several perforated, total occlusion of the proximal circumflex,  and high grade obstruction in the proximal RCA (dominant vessel) Overall normal LV function with EF 55%  Echo 4/17 EF 60-65%, normal wall motion, grade 1 diastolic dysfunction  Myoview 4/17 Nuclear stress EF: 47%. There was no ST segment deviation noted during stress. There is a large defect of severe severity present in the basal anteroseptal, mid anteroseptal, apical anterior, apical septal and apex location. The defect is partially reversible. This is consistent with scar with peri infarct ischemia. There is a large defect of severe severity present in the basal inferoseptal, basal inferior, basal inferolateral, mid inferoseptal, mid inferior, mid inferolateral and apical inferior location. The defect is partially reversible and consistent with infarct with peri infarct ischemia. This is a high risk study. The left ventricular ejection fraction is mildly decreased (45-54%).  Echo 10/13 ([place]) Mild LVH, proximal septal thickening, EF > 55%, impaired relaxation, mild to mod LAE, mild MAC, mild MR, mild TR, RVSP 30-1mmHg,   ASSESSMENT:     1. Coronary artery disease involving native coronary artery of native heart without angina pectoris   2. Paroxysmal atrial fibrillation (HCC)   3. Essential hypertension   4. Hyperlipidemia     PLAN:     In order of problems listed above:  1. CAD - s/p CABG.  Recovering  nicely.  He sees Dr. Cyndia Bent in a couple weeks.  Continue ASA, statin, beta-blocker.  Refer to cardiac rehab.    2. PAF - Post op AFib.  Will decrease Amio to 200 QD.  At FU in 4 weeks, if in NSR, will plan on DCing Amiodarone.  Would continue coumadin for a couple months after stopping Amio.  If he maintains NSR after DCing Amiodarone, we can probably stop Coumadin.   3. HTN - Controlled.  His PCP has changed his Olmesartan to Irbesartan for cost.  4. HL - Continue statin. This is managed by PCP. LDL goal is < 70.     Medication Adjustments/Labs and Tests Ordered: Current medicines are reviewed at length with the patient today.  Concerns regarding medicines are outlined above.  Medication changes, Labs and Tests ordered today are outlined in the Patient Instructions noted below. Patient Instructions  Medication Instructions:  1. DECREASE AMIODARONE TO 200 MG DAILY; NEW RX SENT IN 2. CHANGE METOPROLOL TO 37.5 MG TWICE DAILY; NEW RX SENT IN Labwork: NONE Testing/Procedures: NONE Follow-Up: [employer], [facility] 10/04/15 @ 10:15 Any Other Special Instructions Will Be Listed Below (If Applicable). CARDIAC REHAB AT [facility] If you need a refill on your cardiac medications before your next appointment, please call your pharmacy.    Signed, Richardson Dopp, PA-C  09/03/2015 4:58 PM    [facility] [place], [place], [place]  [address] Phone: [phone]; Fax: [phone]

## 2015-09-03 ENCOUNTER — Encounter: Payer: Self-pay | Admitting: Physician Assistant

## 2015-09-03 ENCOUNTER — Ambulatory Visit (INDEPENDENT_AMBULATORY_CARE_PROVIDER_SITE_OTHER): Payer: PPO | Admitting: *Deleted

## 2015-09-03 ENCOUNTER — Telehealth: Payer: Self-pay | Admitting: Interventional Cardiology

## 2015-09-03 ENCOUNTER — Ambulatory Visit (INDEPENDENT_AMBULATORY_CARE_PROVIDER_SITE_OTHER): Payer: PPO | Admitting: Physician Assistant

## 2015-09-03 VITALS — BP 120/60 | HR 62 | Ht 67.0 in | Wt 179.0 lb

## 2015-09-03 DIAGNOSIS — I10 Essential (primary) hypertension: Secondary | ICD-10-CM | POA: Diagnosis not present

## 2015-09-03 DIAGNOSIS — Z5181 Encounter for therapeutic drug level monitoring: Secondary | ICD-10-CM

## 2015-09-03 DIAGNOSIS — Z951 Presence of aortocoronary bypass graft: Secondary | ICD-10-CM | POA: Diagnosis not present

## 2015-09-03 DIAGNOSIS — I480 Paroxysmal atrial fibrillation: Secondary | ICD-10-CM

## 2015-09-03 DIAGNOSIS — E785 Hyperlipidemia, unspecified: Secondary | ICD-10-CM | POA: Diagnosis not present

## 2015-09-03 DIAGNOSIS — I2510 Atherosclerotic heart disease of native coronary artery without angina pectoris: Secondary | ICD-10-CM | POA: Diagnosis not present

## 2015-09-03 DIAGNOSIS — I4891 Unspecified atrial fibrillation: Secondary | ICD-10-CM

## 2015-09-03 LAB — POCT INR: INR: 2.9

## 2015-09-03 MED ORDER — AMIODARONE HCL 200 MG PO TABS: 200.0000 mg | ORAL_TABLET | Freq: Every day | ORAL | Status: DC

## 2015-09-03 MED ORDER — METOPROLOL TARTRATE 37.5 MG PO TABS: 37.5000 mg | ORAL_TABLET | Freq: Two times a day (BID) | ORAL | Status: DC

## 2015-09-03 NOTE — Telephone Encounter (Signed)
New message     Patient wife calling - seen this am with App-   Pt c/o medication issue:  1. Name of Medication: metoprolol    2. How are you currently taking this medication (dosage and times per day)? Was 25 mg - 3 time a time / new dosage 37.5 mg   3. Are you having a reaction (difficulty breathing--STAT)? No   4. What is your medication issue? has some questions medication change

## 2015-09-03 NOTE — Telephone Encounter (Signed)
Returned call to pt and his wife in regards to question about the metoprolol dose. I went over the change again from this morning, where [place] W. PA thought it would be easier to change Metoprolol from 25 mg TID to Metoprolol 37.5 mg BID. Both thanked me for the call back.

## 2015-09-03 NOTE — Patient Instructions (Addendum)
Medication Instructions:  1. DECREASE AMIODARONE TO 200 MG DAILY; NEW RX SENT IN 2. CHANGE METOPROLOL TO 37.5 MG TWICE DAILY; NEW RX SENT IN Labwork: NONE Testing/Procedures: NONE Follow-Up: [employer], [facility] 10/04/15 @ 10:15 Any Other Special Instructions Will Be Listed Below (If Applicable). CARDIAC REHAB AT [facility] If you need a refill on your cardiac medications before your next appointment, please call your pharmacy.

## 2015-09-04 ENCOUNTER — Other Ambulatory Visit: Payer: Self-pay

## 2015-09-04 NOTE — Patient Outreach (Signed)
[facility]) Care Management  [place]  09/04/2015   [name] [name] 08-24-1945 [id number]  Subjective: Member reports feeling better. Denies any concerns at this time.  Objective: BP 128/70 mmHg  Pulse 57  Resp 20  Ht 1.702 m (5\' 7" )  Wt 179 lb (81.194 kg)  BMI 28.03 kg/m2  SpO2 97% Lungs clear,heart rate regular.  Encounter Medications:  Outpatient Encounter Prescriptions as of 09/04/2015  Medication Sig  . amiodarone (PACERONE) 200 MG tablet Take 1 tablet (200 mg total) by mouth daily.  Marland Kitchen aspirin 81 MG tablet Take 81 mg by mouth daily.  . Cholecalciferol (VITAMIN D) 2000 units tablet Take 2,000 Units by mouth 3 (three) times a week.   . Cyanocobalamin (B-12) 2500 MCG TABS Take 1 tablet by mouth 3 (three) times a week.  . metFORMIN (GLUCOPHAGE-XR) 500 MG 24 hr tablet Take 1,000 mg by mouth daily.   . metoprolol tartrate 37.5 MG TABS Take 37.5 mg by mouth 2 (two) times daily.  Marland Kitchen olmesartan (BENICAR) 40 MG tablet Take 40 mg by mouth daily after supper.   Marland Kitchen oxyCODONE (OXY IR/ROXICODONE) 5 MG immediate release tablet Take 1-2 tablets (5-10 mg total) by mouth every 6 (six) hours as needed for severe pain.  . simvastatin (ZOCOR) 20 MG tablet Take 20 mg by mouth daily.  Marland Kitchen warfarin (COUMADIN) 2.5 MG tablet Take 1 tablet (2.5 mg total) by mouth daily. And as directed by the coumadin clinic  . triamcinolone cream (KENALOG) 0.1 % Apply 1 application topically 2 (two) times daily as needed. Reported on 09/04/2015   No facility-administered encounter medications on file as of 09/04/2015.    Functional Status:  In your present state of health, do you have any difficulty performing the following activities: 08/30/2015 08/16/2015  Hearing? N N  Vision? N N  Difficulty concentrating or making decisions? N N  Walking or climbing stairs? N N  Dressing or bathing? N N  Doing errands, shopping? N N  Preparing Food and eating ? N -  Using the Toilet? N -  In the past six  months, have you accidently leaked urine? N -  Do you have problems with loss of bowel control? N -  Managing your Medications? N -  Managing your Finances? N -  Housekeeping or managing your Housekeeping? N -    Fall/Depression Screening: PHQ 2/9 Scores 08/30/2015  PHQ - 2 Score 0    Assessment: 70 year old with recent coronary artery bypass graft. Member has supportive wife who is also knowledgeable about his care. Member has attended follow up appointments-Saw cardiologist this week.  Medications reviewed. Member manages medication without difficulty.  History of diabetes. Member is taking Metformin. Member reports he does not check his blood sugars and does not have a glucose meter. Member to follow up with primary care regarding obtaining a glucose meter. Discussed A1C level.  Plan: continue to follow for weekly engagements for transition of care and diabetes education/reinforcement. [facility] CM Care Plan Problem One        Most Recent Value   Care Plan Problem One  at risk for readmission   Role Documenting the Problem One  Care Management [place] for Problem One  Active   [facility] Long Term Goal (31-90 days)  member will not be hospitalized within the next 31 days.   [facility] Long Term Goal Start Date  08/21/15   Interventions for Problem One Long Term Goal  transition of  care call, reviewed upcoming appointments   [facility] CM Short Term Goal #1 (0-30 days)  member will attend follow up appointments as scheduled within the next 30 days.   [facility] CM Short Term Goal #1 Start Date  08/21/15   Interventions for Short Term Goal #1  discussed upcoming appointments.   [facility] CM Short Term Goal #2 (0-30 days)  member will call RNCM, provider, 24hour nurse advice line as needed within the next 30 days.   [facility] CM Short Term Goal #2 Start Date  08/21/15   Interventions for Short Term Goal #2  encouraged to call as needed.    [facility] CM Care Plan Problem Two        Most Recent Value   Care Plan  Problem Two  knowledge deficit related to diabetes   Role Documenting the Problem Two  Care Management Coordinator   Care Plan for Problem Two  Active   Interventions for Problem Two Long Term Goal   discussed importance of managing blood sugars, the effects on healing, encouraged member to follow up with pirmary care regarding obtaining a glucose meter.   THN Long Term Goal (31-90) days  member will verbalize at least three strategies for manageing diabetes within the next 45-90 days.   THN Long Term Goal Start Date  09/04/15   [facility] CM Short Term Goal #1 (0-30 days)  member will follow up with pirmary care to obtain glucose meter within the next 30 days.   [facility] CM Short Term Goal #1 Start Date  09/04/15   Interventions for Short Term Goal #2   discussed importance of checking blood sugars.     Thea Silversmith, RN, MSN, [place] Coordinator Cell: [phone]

## 2015-09-10 ENCOUNTER — Ambulatory Visit (INDEPENDENT_AMBULATORY_CARE_PROVIDER_SITE_OTHER): Payer: PPO | Admitting: Pharmacist

## 2015-09-10 DIAGNOSIS — Z951 Presence of aortocoronary bypass graft: Secondary | ICD-10-CM | POA: Diagnosis not present

## 2015-09-10 DIAGNOSIS — Z5181 Encounter for therapeutic drug level monitoring: Secondary | ICD-10-CM | POA: Diagnosis not present

## 2015-09-10 DIAGNOSIS — I4891 Unspecified atrial fibrillation: Secondary | ICD-10-CM | POA: Diagnosis not present

## 2015-09-10 LAB — POCT INR: INR: 3.5

## 2015-09-11 ENCOUNTER — Other Ambulatory Visit: Payer: Self-pay | Admitting: *Deleted

## 2015-09-11 DIAGNOSIS — Z951 Presence of aortocoronary bypass graft: Secondary | ICD-10-CM

## 2015-09-12 ENCOUNTER — Ambulatory Visit (INDEPENDENT_AMBULATORY_CARE_PROVIDER_SITE_OTHER): Payer: Self-pay | Admitting: Surgery

## 2015-09-12 ENCOUNTER — Encounter: Payer: Self-pay | Admitting: Surgery

## 2015-09-12 ENCOUNTER — Ambulatory Visit
Admission: RE | Admit: 2015-09-12 | Discharge: 2015-09-12 | Disposition: A | Discharge status: Home / Self Care | Payer: PPO | Source: Ambulatory Visit | Attending: Surgery | Admitting: Surgery

## 2015-09-12 VITALS — BP 120/74 | HR 61 | Resp 16 | Ht 67.0 in | Wt 179.0 lb

## 2015-09-12 DIAGNOSIS — Z951 Presence of aortocoronary bypass graft: Secondary | ICD-10-CM

## 2015-09-12 DIAGNOSIS — I2510 Atherosclerotic heart disease of native coronary artery without angina pectoris: Secondary | ICD-10-CM

## 2015-09-12 DIAGNOSIS — I10 Essential (primary) hypertension: Secondary | ICD-10-CM | POA: Diagnosis not present

## 2015-09-12 NOTE — Progress Notes (Signed)
    HPI: Patient returns for routine postoperative follow-up having undergone CABG x 4 on 08/13/2015. The patient's early postoperative recovery while in the hospital was notable for development of postoperative atrial fibrillation. He was started on amiodarone and converted but had recurrent episodes and was started on coumadin. Since hospital discharge the patient reports that he has felt well. He is walking 3/4 -1 mile per day without chest pain or shortness of breath. He saw Richardson Dopp in the cardiology office and his amiodarone was decreased to 200 mg daily. His INR was 3.5 two days ago and his coumadin dose was decreased. It is being followed in the [facility] clinic.   Current Outpatient Prescriptions  Medication Sig Dispense Refill  . amiodarone (PACERONE) 200 MG tablet Take 1 tablet (200 mg total) by mouth daily. 90 tablet 3  . aspirin 81 MG tablet Take 81 mg by mouth daily.    . Cholecalciferol (VITAMIN D) 2000 units tablet Take 2,000 Units by mouth 3 (three) times a week.     . Cyanocobalamin (B-12) 2500 MCG TABS Take 1 tablet by mouth 3 (three) times a week.    . metFORMIN (GLUCOPHAGE-XR) 500 MG 24 hr tablet Take 1,000 mg by mouth daily.   11  . metoprolol tartrate 37.5 MG TABS Take 37.5 mg by mouth 2 (two) times daily. 180 tablet 3  . olmesartan (BENICAR) 40 MG tablet Take 40 mg by mouth daily after supper.     . simvastatin (ZOCOR) 20 MG tablet Take 20 mg by mouth daily.    Marland Kitchen triamcinolone cream (KENALOG) 0.1 % Apply 1 application topically 2 (two) times daily as needed. Reported on 09/04/2015    . warfarin (COUMADIN) 2.5 MG tablet Take 1 tablet (2.5 mg total) by mouth daily. And as directed by the coumadin clinic 100 tablet 1   No current facility-administered medications for this visit.    Physical Exam: BP 120/74 mmHg  Pulse 61  Resp 16  Ht 5\' 7"  (1.702 m)  Wt 179 lb (81.194 kg)  BMI 28.03 kg/m2  SpO2 97% He looks well. Lung exam is clear. Cardiac exam shows a regular rate  and rhythm with normal heart sounds. Chest incision is healing well and sternum is stable. The leg incisions are healing well and there is no peripheral edema.   Diagnostic Tests:  CLINICAL DATA: CABG. Hypertension. Diabetes.  EXAM: CHEST 2 VIEW  COMPARISON: 08/15/2015.  FINDINGS: Prior CABG. Cardiomegaly with normal pulmonary vascularity. No focal infiltrate. No pleural effusion or pneumothorax. Hiatal hernia again noted. Degenerative changes thoracic spine.  IMPRESSION: 1. Prior CABG. Heart size stable. No acute cardiopulmonary disease.  2. Large sliding hiatal hernia again noted.   Electronically Signed  By: Marcello Moores Register  On: 09/12/2015 09:13   Impression:  Overall I think he is doing well. I encouraged him to continue walking. He is planning to participate in cardiac rehab. I told him he could drive his car but should not lift anything heavier than 10 lbs for three months postop.   Plan:  He is going to follow up with cardiology in a couple weeks and will return to see me if there are any problems with his incisions.   Gaye Pollack, MD [facility] [phone]

## 2015-09-13 ENCOUNTER — Other Ambulatory Visit: Payer: Self-pay

## 2015-09-13 ENCOUNTER — Telehealth: Payer: Self-pay | Admitting: Physician Assistant

## 2015-09-13 DIAGNOSIS — I10 Essential (primary) hypertension: Secondary | ICD-10-CM

## 2015-09-13 DIAGNOSIS — I480 Paroxysmal atrial fibrillation: Secondary | ICD-10-CM

## 2015-09-13 NOTE — Telephone Encounter (Signed)
New message      Pt c/o medication issue:  1. Name of Medication: Metoprolol  2. How are you currently taking this medication (dosage and times per day)? 37.5 mg taking once daily  3. Are you having a reaction (difficulty breathing--STAT)? no  4. What is your medication issue? Caryl Pina at the pharmacy wanted to know cause of insurance cost can the MD switch the Metoprolol to 25mg  and 1 1/2 tablets twice daily and if so can the Md call in the medication    *STAT* If patient is at the pharmacy, call can be transferred to refill team.   1. Which medications need to be refilled? (please list name of each medication and dose if known) Metoprolol  2. Which pharmacy/location (including street and city if local pharmacy) is medication to be sent to? [facility] pharmacy  3. Do they need a 30 day or 90 day supply? 30 day

## 2015-09-13 NOTE — Patient Outreach (Signed)
[facility]) Care Management  09/13/2015  [name] [name] 1945-08-13 [id number]  Transition of care call. Member reports he is doing well. He was seen by cardiac surgeon on yesterday and is doing well. Member reports he is able to drive now. Medications reviewed-no change except coumadin adjusted.  Member has an appointment with primary care on tomorrow. Member to request glucose meter and find out high and low range for blood sugar readings.  Plan: transition of care call next week.  Thea Silversmith, RN, MSN, [place] Coordinator Cell: [phone]

## 2015-09-14 DIAGNOSIS — Z951 Presence of aortocoronary bypass graft: Secondary | ICD-10-CM | POA: Diagnosis not present

## 2015-09-14 DIAGNOSIS — I2510 Atherosclerotic heart disease of native coronary artery without angina pectoris: Secondary | ICD-10-CM | POA: Diagnosis not present

## 2015-09-14 MED ORDER — METOPROLOL TARTRATE 25 MG PO TABS: 37.5000 mg | ORAL_TABLET | Freq: Two times a day (BID) | ORAL | Status: DC

## 2015-09-14 MED FILL — METOPROLOL TARTRATE 25 MG T: 25 | 30 days supply | Qty: 90 | Fill #0

## 2015-09-14 NOTE — Telephone Encounter (Signed)
S/w Pharm D and ok to change Rx to Metoprolol Tartrate 25 mg tablet with directions to take 1 and 1/2 tabs BID # 270 x 3 .

## 2015-09-18 ENCOUNTER — Other Ambulatory Visit: Payer: Self-pay

## 2015-09-18 NOTE — Patient Outreach (Signed)
[facility]) Care Management  09/18/2015  [name] [name] 1946-03-05 [id number]  RNCM called to follow up. Member answered the phone. Member reports he did not have a severe headache yesterday and that he reports he has not had a headache at all today. RNCM informed member if he has a headache that is the worst pain he has ever had that emergncy room visit would be advised. Member informed if he is starting to get regular headaches to contact his primary care for an appointment. Member verbalized understanding.   Member reports he received his blood sugar meter and has parameters for when to call the doctor. Member reports his sugar is ranging from 102-120 and states he is varying the time of day.  Plan: continue weekly engagements.  Thea Silversmith, RN, MSN, [place] Coordinator Cell: [phone]

## 2015-09-18 NOTE — Patient Outreach (Signed)
[facility]) Care Management  09/18/2015  JAMA DILONE 03-Jan-1946 [id number]   Nurse line report received, notification sent to Thea Silversmith, RN.  Thanks, Ronnell Freshwater. [place], [place] Assistant Phone: [phone] Fax: [phone]

## 2015-09-18 NOTE — Patient Outreach (Signed)
[facility]) Care Management  09/18/2015  [name] [name] Oct 27, 1945 [id number]  RNCM called for transition of care. Member was not in [facility] spoke with member's wife(on consent, two patient identifiers obtained). [name] [name] reports she called the nurse advice line yesterday because member was complaining of a headache for past several days. Member was advised to go to the Emergency room. However member refused to go. Mrs. [name] reports headache is unusual for member adding he took a tylenol and he went to bed and for him to do this his headache had to be really bad. Mrs. [name] reports he was better when he woke up. Mrs. [name] reports that he promised her if he had a headache today he would let her know and go to the emergency room to be evaluated. RNCM reinforced if the headache was the worst headache he ever felt to go to the emergecy room. RNCM suggested to Mrs. [name] that she notify member's primary care and maybe he could be worked in to be evaluated. Mrs. [name] thanked [facility] for this advise.  Plan: RNCM will follow up within the next 1-2 days.  Thea Silversmith, RN, MSN, [place] Coordinator Cell: [phone]

## 2015-09-20 ENCOUNTER — Other Ambulatory Visit: Payer: Self-pay | Admitting: *Deleted

## 2015-09-20 ENCOUNTER — Ambulatory Visit (INDEPENDENT_AMBULATORY_CARE_PROVIDER_SITE_OTHER): Payer: PPO | Admitting: *Deleted

## 2015-09-20 DIAGNOSIS — Z5181 Encounter for therapeutic drug level monitoring: Secondary | ICD-10-CM | POA: Diagnosis not present

## 2015-09-20 DIAGNOSIS — Z951 Presence of aortocoronary bypass graft: Secondary | ICD-10-CM

## 2015-09-20 DIAGNOSIS — I480 Paroxysmal atrial fibrillation: Secondary | ICD-10-CM

## 2015-09-20 DIAGNOSIS — I4891 Unspecified atrial fibrillation: Secondary | ICD-10-CM | POA: Diagnosis not present

## 2015-09-20 LAB — POCT INR: INR: 2.6

## 2015-09-20 MED ORDER — AMIODARONE HCL 200 MG PO TABS: 200.0000 mg | ORAL_TABLET | Freq: Every day | ORAL | Status: DC

## 2015-09-20 MED FILL — AMIODARONE HCL 200 MG TAB: 200 | 30 days supply | Qty: 30 | Fill #0

## 2015-09-24 MED FILL — ONE TOUCH DELICA 33G LANCET: 90 days supply | Qty: 100 | Fill #0

## 2015-09-24 MED FILL — ONETOUCH VERIO TEST STRIP: 90 days supply | Qty: 100 | Fill #0

## 2015-09-26 ENCOUNTER — Telehealth: Payer: Self-pay | Admitting: Cardiovascular Disease

## 2015-09-26 NOTE — Telephone Encounter (Signed)
New message     The pt is calling regarding his continuous headache the pt states it might be the medications or maybe something else going on the pt states he uncertain of why he is having the headaches. The pt states usually sees [employer]

## 2015-09-26 NOTE — Telephone Encounter (Signed)
I do not think his headaches would be related to any medication changes we made.  I would recommend he get his BP checked and to see his primary care provider to further evaluate his HAs. Richardson Dopp, PA-C   09/26/2015 3:09 PM

## 2015-09-26 NOTE — Telephone Encounter (Signed)
Called patient back with [employer] message. Patient verbalized understanding and will follow-up with PCP.

## 2015-09-26 NOTE — Telephone Encounter (Signed)
Patient is having headaches off and on for about a week. Patient states sometimes headaches are more intense than other times. Patient stated headache starts at base of his skull to up and around to his right eye. Patient denies chest pain or SOB. Patient is wonder if this is due to his medication changes. Patient does not have any BP or HR readings. Encouraged patient to call his PCP about the headaches and will send message to [facility] PA, who is seeing him on 10/04/15 for further recommendations.

## 2015-09-27 ENCOUNTER — Other Ambulatory Visit: Payer: Self-pay

## 2015-09-27 NOTE — Patient Outreach (Signed)
[facility]) Care Management  [place]  09/27/2015   [name] [name] [birth year] [id number]  Subjective: member reports has has been having headaches, which is unusual for him.  Objective: BP 158/84 mmHg  Pulse 54  Resp 20  Ht 1.702 m (5\' 7" )  Wt 182 lb (82.555 kg)  BMI 28.50 kg/m2  SpO2 97%, lungs clear/decreased, heart rate regular.  Encounter Medications:  Outpatient Encounter Prescriptions as of 09/27/2015  Medication Sig  . amiodarone (PACERONE) 200 MG tablet Take 1 tablet (200 mg total) by mouth daily.  Marland Kitchen aspirin 81 MG tablet Take 81 mg by mouth daily.  . Cholecalciferol (VITAMIN D) 2000 units tablet Take 2,000 Units by mouth 3 (three) times a week.   . Cyanocobalamin (B-12) 2500 MCG TABS Take 1 tablet by mouth 3 (three) times a week.  . irbesartan (AVAPRO) 300 MG tablet Take 300 mg by mouth daily.  . metFORMIN (GLUCOPHAGE-XR) 500 MG 24 hr tablet Take 1,000 mg by mouth daily.   . metoprolol tartrate (LOPRESSOR) 25 MG tablet Take 1.5 tablets (37.5 mg total) by mouth 2 (two) times daily.  . simvastatin (ZOCOR) 20 MG tablet Take 20 mg by mouth daily.  Marland Kitchen triamcinolone cream (KENALOG) 0.1 % Apply 1 application topically 2 (two) times daily as needed. Reported on 09/04/2015  . warfarin (COUMADIN) 2.5 MG tablet Take 1 tablet (2.5 mg total) by mouth daily. And as directed by the coumadin clinic   No facility-administered encounter medications on file as of 09/27/2015.    Functional Status:  In your present state of health, do you have any difficulty performing the following activities: 08/30/2015 08/16/2015  Hearing? N N  Vision? N N  Difficulty concentrating or making decisions? N N  Walking or climbing stairs? N N  Dressing or bathing? N N  Doing errands, shopping? N N  Preparing Food and eating ? N -  Using the Toilet? N -  In the past six months, have you accidently leaked urine? N -  Do you have problems with loss of bowel control? N -  Managing your  Medications? N -  Managing your Finances? N -  Housekeeping or managing your Housekeeping? N -    Fall/Depression Screening: PHQ 2/9 Scores 08/30/2015  PHQ - 2 Score 0    Assessment: 70 year old with history of coronary artery bypass. Present during visit was member's wife. Member reports he is recovering well, is walking outside routinely without problems. Member reports he will soon begin cardiac rehabilitation.  Member denies headache during home visit, however reports he has been having headaches at times, which is unusual for him. Member states he has seen primary care, but forgot to mention because he was not having a headache at the time of the visit. Both wife and member concerned that member does not usually get headaches. Member reports he has tried tylenol, but only takes one tablet. Encouraged member to contact primary care if tylenol does not help.  History of diabetes-Member has glucose machine and has been checking blood sugar with range of 102-120.  Blood pressure 158/84 left arm, 162/80 in right arm. Member has an appointment with cardiologist within the next 1-2 weeks.   Plan: follow up with member within 1-2 weeks post follow up provider visit.  Thea Silversmith, RN, MSN, [place] Coordinator Cell: [phone]

## 2015-09-28 MED FILL — METFORMIN HCL ER 500 MG TAB: 500 | 30 days supply | Qty: 60 | Fill #3

## 2015-10-03 NOTE — Progress Notes (Signed)
Cardiology Office Note:    Date:  10/04/2015   ID:  [name] [name], DOB [birth year], MRN [id number]  PCP:  Jonathon Bellows, MD  Cardiologist: Dr. Jenkins Rouge >> patient requests Dr. Daneen Schick  Electrophysiologist: n/a  Referring MD: Maurice Small, MD   Chief Complaint  Patient presents with  . Follow-up    CAD, post op AFib    History of Present Illness:     [name] [name] is a 70 y.o. male with a hx of LBBB, HTN, HL, DM2, FHx CAD. He was evaluated for DOE and episodic L arm pain in 3/17.A Myoview was high risk and LHC demonstrated severe 3 vessel CAD. He underwent CABG with LIMA-LAD, SVG-PDA/PL 1, SVG-OM. Postoperative course was complicated by atrial fibrillation and was placed on amiodarone. He had recurrent episodes of atrial fibrillation and Coumadin was started. Last seen by me in FU on 09/03/15.    He returns for follow up.  Here alone today. Since last seen, he has been doing well. He denies chest discomfort or significant dyspnea. Denies orthopnea, PND or edema. Denies syncope. He starts cardiac rehabilitation in the next 2-3 weeks. He has been having some occipital headaches. He sees primary care tomorrow.   Past Medical History  Diagnosis Date  . Hypertension   . Diabetes mellitus   . HLD (hyperlipidemia)   . BPH (benign prostatic hyperplasia)     [facility]  . Nephrolithiasis   . LBBB (left bundle branch block)   . Coronary artery disease     a. Myoview 4/17: EF 47%, anteroseptal, apical anterior, apical septal, apical scar with peri-infarct ischemia, inferoseptal, inferior, inferolateral, apical inferior infarct with peri-infarct ischemia, high risk // b. LHC 4/17: Proximal RCA 99%, proximal LCx 100%, mid LAD 90%, distal LAD 60%, EF 55%  . History of echocardiogram     a. Echo 4/17:  EF 60-65%, normal wall motion, grade 1 diastolic dysfunction  . History of Doppler ultrasound     a. Carotid US 4/17:  bilateral ICA 1-39%    Past Surgical History    Procedure Laterality Date  . Appendectomy  1976  . Cardiac catheterization N/A 08/03/2015    Procedure: Left Heart Cath and Coronary Angiography;  Surgeon: Belva Crome, MD;  Location: [facility];  Service: Cardiovascular;  Laterality: N/A;  . Colonoscopy    . Coronary artery bypass graft N/A 08/13/2015    Procedure: CORONARY ARTERY BYPASS GRAFTING (CABG) x 4 using left internal mammary artery and right leg saphenous vein;  Surgeon: Gaye Pollack, MD;  Location: [facility];  Service: Open Heart Surgery;  Laterality: N/A;  . Tee without cardioversion N/A 08/13/2015    Procedure: TRANSESOPHAGEAL ECHOCARDIOGRAM (TEE);  Surgeon: Gaye Pollack, MD;  Location: [facility];  Service: Open Heart Surgery;  Laterality: N/A;    Current Medications: Outpatient Prescriptions Prior to Visit  Medication Sig Dispense Refill  . amiodarone (PACERONE) 200 MG tablet Take 1 tablet (200 mg total) by mouth daily. 30 tablet 0  . aspirin 81 MG tablet Take 81 mg by mouth daily.    . Cholecalciferol (VITAMIN D) 2000 units tablet Take 2,000 Units by mouth 3 (three) times a week.     . Cyanocobalamin (B-12) 2500 MCG TABS Take 1 tablet by mouth 3 (three) times a week.    . irbesartan (AVAPRO) 300 MG tablet Take 300 mg by mouth daily.    . metFORMIN (GLUCOPHAGE-XR) 500 MG 24 hr tablet Take 1,000 mg  by mouth daily.   11  . metoprolol tartrate (LOPRESSOR) 25 MG tablet Take 1.5 tablets (37.5 mg total) by mouth 2 (two) times daily. 270 tablet 3  . simvastatin (ZOCOR) 20 MG tablet Take 20 mg by mouth daily.    Marland Kitchen triamcinolone cream (KENALOG) 0.1 % Apply 1 application topically 2 (two) times daily as needed. Reported on 09/04/2015    . warfarin (COUMADIN) 2.5 MG tablet Take 1 tablet (2.5 mg total) by mouth daily. And as directed by the coumadin clinic 100 tablet 1   No facility-administered medications prior to visit.      Allergies:   Review of patient's allergies indicates no known allergies.   Social History   Social  History  . Marital Status: Married    Spouse Name: N/A  . Number of Children: N/A  . Years of Education: N/A   Social History Main Topics  . Smoking status: Never Smoker   . Smokeless tobacco: Not on file  . Alcohol Use: No  . Drug Use: No  . Sexual Activity: Not on file   Other Topics Concern  . Not on file   Social History Narrative   Retired   Married; 1 Licensed [employer] x 30 years   Originally from [place]     Family History:  The patient's family history includes Heart attack (age of onset: 63) in his father; Heart disease in his father and mother; Heart failure (age of onset: 59) in his mother.   ROS:   Please see the history of present illness.    Review of Systems  HENT: Positive for headaches.    All other systems reviewed and are negative.   Physical Exam:    VS:  BP 124/86 mmHg  Pulse 52  Ht 5\' 7"  (1.702 m)  Wt 183 lb (83.008 kg)  BMI 28.66 kg/m2   Physical Exam  Constitutional: He is oriented to person, place, and time. He appears well-developed.  HENT:  Head: Normocephalic.  Neck: Normal range of motion. No JVD present.  Cardiovascular: Normal rate, regular rhythm and normal heart sounds.   No murmur heard. Pulmonary/Chest: Effort normal and breath sounds normal. He has no wheezes. He has no rales.  Abdominal: Soft. There is no tenderness.  Musculoskeletal: Normal range of motion. He exhibits no edema.  Neurological: He is alert and oriented to person, place, and time.  Skin: Skin is warm and dry.  Psychiatric: He has a normal mood and affect.    Wt Readings from Last 3 Encounters:  10/04/15 183 lb (83.008 kg)  09/27/15 182 lb (82.555 kg)  09/12/15 179 lb (81.194 kg)      Studies/Labs Reviewed:     EKG:  EKG is  ordered today.  The ekg ordered today demonstrates Sinus bradycardia, HR 52, LBBB  Recent Labs: 08/10/2015: ALT 37 08/14/2015: Magnesium 2.0 08/16/2015: Hemoglobin 10.2*; Platelets 127* 08/18/2015: BUN  17; Creatinine, Ser 1.14; Potassium 4.1; Sodium 139   Recent Lipid Panel No results found for: CHOL, TRIG, HDL, CHOLHDL, VLDL, LDLCALC, LDLDIRECT  Additional studies/ records that were reviewed today include:   Carotid US 4/17 Bilat ICA 1-39%  LHC 4/17 1. Prox RCA lesion, 99% stenosed. 2. Prox Cx to Mid Cx lesion, 100% stenosed. 3. Mid LAD lesion, 90% stenosed. 4. Dist LAD lesion, 60% stenosed. Severe three-vessel coronary disease in 70 year old diabetic patient with 90% proximal LAD arising after the second diagonal but before the first several perforated, total occlusion of the  proximal circumflex, and high grade obstruction in the proximal RCA (dominant vessel) Overall normal LV function with EF 55%  Echo 4/17 EF 60-65%, normal wall motion, grade 1 diastolic dysfunction  Myoview 4/17 Nuclear stress EF: 47%. There was no ST segment deviation noted during stress. There is a large defect of severe severity present in the basal anteroseptal, mid anteroseptal, apical anterior, apical septal and apex location. The defect is partially reversible. This is consistent with scar with peri infarct ischemia. There is a large defect of severe severity present in the basal inferoseptal, basal inferior, basal inferolateral, mid inferoseptal, mid inferior, mid inferolateral and apical inferior location. The defect is partially reversible and consistent with infarct with peri infarct ischemia. This is a high risk study. The left ventricular ejection fraction is mildly decreased (45-54%).  Echo 10/13 ([place]) Mild LVH, proximal septal thickening, EF > 55%, impaired relaxation, mild to mod LAE, mild MAC, mild MR, mild TR, RVSP 30-87mmHg,    ASSESSMENT:     1. Coronary artery disease involving native coronary artery of native heart without angina pectoris   2. Paroxysmal atrial fibrillation (HCC)   3. Essential hypertension   4. Hyperlipidemia     PLAN:     In order of problems listed  above:  1. CAD - s/p CABG. He is continuing to do very well.  He denies anginal symptoms. Continue ASA, statin, beta-blocker. In July, he will start cardiac rehab.   2. PAF - Post op AFib. He remains in NSR.  I will stop his Amiodarone on 10/15/15.  Continue Coumadin. FU with the CVRR clinic.  At FU in 3 mos, if he remains in NSR, consider DC Coumadin.     3. HTN - Controlled. Current regimen includes Irbesartan, Metoprolol Tartrate.  4. HL - Continue Simvastatin. This is managed by PCP. LDL goal is < 70.  Given current ACC guidelines, consider changing to mod to high intensity statin (Lipitor 40 vs Lipitor 80 or Crestor 20 vs Crestor 40).  Will defer to PCP.   Medication Adjustments/Labs and Tests Ordered: Current medicines are reviewed at length with the patient today.  Concerns regarding medicines are outlined above.  Medication changes, Labs and Tests ordered today are outlined in the Patient Instructions noted below. Patient Instructions  Medication Instructions:  STOP TAKING AMIDARONE 10/15/2015.. If you need a refill on your cardiac medications before your next appointment, please call your pharmacy. Labwork: NONE ORDER TODAY Testing/Procedures: NONE ORDER TODAY Follow-Up: 3 MONTHS WITH DR Tamala Julian  Any Other Special Instructions Will Be Listed Below (If Applicable).   Signed, Richardson Dopp, PA-C  10/04/2015 10:17 AM    [facility] [address], [place], [place]  [address] Phone: [phone]; Fax: [phone]

## 2015-10-04 ENCOUNTER — Ambulatory Visit (INDEPENDENT_AMBULATORY_CARE_PROVIDER_SITE_OTHER): Payer: PPO | Admitting: Pharmacist

## 2015-10-04 ENCOUNTER — Ambulatory Visit (INDEPENDENT_AMBULATORY_CARE_PROVIDER_SITE_OTHER): Payer: PPO | Admitting: Physician Assistant

## 2015-10-04 ENCOUNTER — Encounter: Payer: Self-pay | Admitting: Physician Assistant

## 2015-10-04 VITALS — BP 124/86 | HR 52 | Ht 67.0 in | Wt 183.0 lb

## 2015-10-04 DIAGNOSIS — I4891 Unspecified atrial fibrillation: Secondary | ICD-10-CM | POA: Diagnosis not present

## 2015-10-04 DIAGNOSIS — E785 Hyperlipidemia, unspecified: Secondary | ICD-10-CM | POA: Diagnosis not present

## 2015-10-04 DIAGNOSIS — I480 Paroxysmal atrial fibrillation: Secondary | ICD-10-CM | POA: Diagnosis not present

## 2015-10-04 DIAGNOSIS — I10 Essential (primary) hypertension: Secondary | ICD-10-CM

## 2015-10-04 DIAGNOSIS — Z5181 Encounter for therapeutic drug level monitoring: Secondary | ICD-10-CM | POA: Diagnosis not present

## 2015-10-04 DIAGNOSIS — Z951 Presence of aortocoronary bypass graft: Secondary | ICD-10-CM

## 2015-10-04 DIAGNOSIS — I2510 Atherosclerotic heart disease of native coronary artery without angina pectoris: Secondary | ICD-10-CM | POA: Diagnosis not present

## 2015-10-04 LAB — POCT INR: INR: 1.9

## 2015-10-04 NOTE — Patient Instructions (Addendum)
Medication Instructions:  STOP TAKING AMIDARONE 10/15/2015.. If you need a refill on your cardiac medications before your next appointment, please call your pharmacy. Labwork: NONE ORDER TODAY Testing/Procedures: NONE ORDER TODAY Follow-Up: 3 MONTHS WITH DR Tamala Julian  Any Other Special Instructions Will Be Listed Below (If Applicable).

## 2015-10-05 DIAGNOSIS — G44209 Tension-type headache, unspecified, not intractable: Secondary | ICD-10-CM | POA: Diagnosis not present

## 2015-10-10 MED FILL — IRBESARTAN 300 MG TABLET: 300 | 30 days supply | Qty: 30 | Fill #1

## 2015-10-10 MED FILL — METOPROLOL TARTRATE 25 MG T: 25 | 30 days supply | Qty: 90 | Fill #1

## 2015-10-11 ENCOUNTER — Other Ambulatory Visit: Payer: Self-pay

## 2015-10-11 NOTE — Patient Outreach (Addendum)
[facility]) Care Management  10/11/2015  [name] [name] 08/28/1945 [id number]   Assessment: 70 year old post coronary artery bypass graft in April. RNCM has been following member for transition of care. Member reports he went to his cardiology appointment, adding that the appointment went well. Member is able to repeat that he is to stop the amiodarone on June 26th. Member has also seen primary care regarding headaches-thought to be due to tension/stress.  Member states he has not had anymore headaches. Member is scheduled to begin cardiac rehabilitation next week. Member denies any questions or concerns.  RNCM discussed transitioning to health coach for diabetes education/reiforcement. Member declines adding he is starting cardiac rehabilitaion next week and will follow up with them.  RNCM discussed case closure. Member is in agreement. [name] is aware that he is able to contact 24hour nurse advice line as needed. RNCM encouraged member to call as needed or if he changes his mind regarding diabetes education.  Plan: close case.  Thea Silversmith, RN, MSN, [place] Coordinator Cell: [phone]

## 2015-10-18 ENCOUNTER — Encounter (HOSPITAL_COMMUNITY)
Admission: RE | Admit: 2015-10-18 | Discharge: 2015-10-18 | Disposition: A | Discharge status: Home / Self Care | Payer: PPO | Source: Ambulatory Visit | Attending: Interventional Cardiology | Admitting: Interventional Cardiology

## 2015-10-18 ENCOUNTER — Ambulatory Visit (INDEPENDENT_AMBULATORY_CARE_PROVIDER_SITE_OTHER): Payer: PPO | Admitting: *Deleted

## 2015-10-18 VITALS — BP 140/88 | HR 63 | Ht 67.0 in | Wt 182.8 lb

## 2015-10-18 DIAGNOSIS — E785 Hyperlipidemia, unspecified: Secondary | ICD-10-CM | POA: Diagnosis not present

## 2015-10-18 DIAGNOSIS — I2510 Atherosclerotic heart disease of native coronary artery without angina pectoris: Secondary | ICD-10-CM | POA: Insufficient documentation

## 2015-10-18 DIAGNOSIS — Z5181 Encounter for therapeutic drug level monitoring: Secondary | ICD-10-CM

## 2015-10-18 DIAGNOSIS — I480 Paroxysmal atrial fibrillation: Secondary | ICD-10-CM | POA: Diagnosis not present

## 2015-10-18 DIAGNOSIS — Z951 Presence of aortocoronary bypass graft: Secondary | ICD-10-CM | POA: Insufficient documentation

## 2015-10-18 DIAGNOSIS — I4891 Unspecified atrial fibrillation: Secondary | ICD-10-CM

## 2015-10-18 DIAGNOSIS — I10 Essential (primary) hypertension: Secondary | ICD-10-CM | POA: Insufficient documentation

## 2015-10-18 LAB — POCT INR: INR: 1.8

## 2015-10-18 NOTE — Progress Notes (Signed)
Cardiac Individual Treatment Plan  Patient Details  Name: [name] [name] MRN: [id number] Date of Birth: 28-May-1945 Referring Provider:        CARDIAC REHAB PHASE II ORIENTATION from 10/18/2015 in [place] [facility]   Referring Provider  Daneen Schick, MD      Initial Encounter Date:       CARDIAC REHAB PHASE II ORIENTATION from 10/18/2015 in [place]   Date  10/18/15   Referring Provider  Daneen Schick, MD      Visit Diagnosis: S/P CABG x 4  Patient's Home Medications on Admission:  Current outpatient prescriptions:  .  aspirin 81 MG tablet, Take 81 mg by mouth daily., Disp: , Rfl:  .  Cholecalciferol (VITAMIN D) 2000 units tablet, Take 2,000 Units by mouth 3 (three) times a week. , Disp: , Rfl:  .  Cyanocobalamin (B-12) 2500 MCG TABS, Take 1 tablet by mouth 3 (three) times a week., Disp: , Rfl:  .  irbesartan (AVAPRO) 300 MG tablet, Take 300 mg by mouth daily., Disp: , Rfl:  .  metFORMIN (GLUCOPHAGE-XR) 500 MG 24 hr tablet, Take 1,000 mg by mouth daily. , Disp: , Rfl: 11 .  metoprolol tartrate (LOPRESSOR) 25 MG tablet, Take 1.5 tablets (37.5 mg total) by mouth 2 (two) times daily., Disp: 270 tablet, Rfl: 3 .  simvastatin (ZOCOR) 20 MG tablet, Take 20 mg by mouth daily., Disp: , Rfl:  .  triamcinolone cream (KENALOG) 0.1 %, Apply 1 application topically 2 (two) times daily as needed. Reported on 09/04/2015, Disp: , Rfl:  .  warfarin (COUMADIN) 2.5 MG tablet, Take 1 tablet (2.5 mg total) by mouth daily. And as directed by the coumadin clinic, Disp: 100 tablet, Rfl: 1  Past Medical History: Past Medical History  Diagnosis Date  . Hypertension   . Diabetes mellitus   . HLD (hyperlipidemia)   . BPH (benign prostatic hyperplasia)     [facility]  . Nephrolithiasis   . LBBB (left bundle branch block)   . Coronary artery disease     a. Myoview 4/17: EF 47%, anteroseptal, apical anterior, apical septal, apical scar with  peri-infarct ischemia, inferoseptal, inferior, inferolateral, apical inferior infarct with peri-infarct ischemia, high risk // b. LHC 4/17: Proximal RCA 99%, proximal LCx 100%, mid LAD 90%, distal LAD 60%, EF 55%  . History of echocardiogram     a. Echo 4/17:  EF 60-65%, normal wall motion, grade 1 diastolic dysfunction  . History of Doppler ultrasound     a. Carotid US 4/17:  bilateral ICA 1-39%    Tobacco Use: History  Smoking status  . Never Smoker   Smokeless tobacco  . Not on file    Labs:     Recent Review Flowsheet Data    Labs for ITP Cardiac and Pulmonary Rehab Latest Ref Rng 08/13/2015 08/13/2015 08/13/2015 08/13/2015 08/13/2015   PHART 7.350 - 7.450 7.366 7.341(L) - 7.446 7.353   PCO2ART 35.0 - 45.0 mmHg 39.0 44.6 - 32.3(L) 41.2   HCO3 20.0 - 24.0 mEq/L 22.7 23.8 - 22.1 22.8   TCO2 0 - 100 mmol/L 24 25 22 23 24    ACIDBASEDEF 0.0 - 2.0 mmol/L 3.0(H) 2.0 - 1.0 2.0   O2SAT - 92.0 90.0 - 96.0 96.0      Capillary Blood Glucose: Lab Results  Component Value Date   GLUCAP 135* 08/18/2015   GLUCAP 130* 08/18/2015   GLUCAP 154* 08/17/2015   GLUCAP 131* 08/17/2015  GLUCAP 118* 08/17/2015     Exercise Target Goals: Date: 10/18/15  Exercise Program Goal: Individual exercise prescription set with THRR, safety & activity barriers. Participant demonstrates ability to understand and report RPE using BORG scale, to self-measure pulse accurately, and to acknowledge the importance of the exercise prescription.  Exercise Prescription Goal: Starting with aerobic activity 30 plus minutes a day, 3 days per week for initial exercise prescription. Provide home exercise prescription and guidelines that participant acknowledges understanding prior to discharge.  Activity Barriers & Risk Stratification:     Activity Barriers & Cardiac Risk Stratification - 10/18/15 0906    Activity Barriers & Cardiac Risk Stratification   Activity Barriers None   Cardiac Risk Stratification High       6 Minute Walk:     6 Minute Walk      10/18/15 1533       6 Minute Walk   Phase Initial     Distance 1400 feet     Walk Time 6 minutes     # of Rest Breaks 0     MPH 2.65     METS 3     RPE 11     VO2 Peak 10.6     Symptoms No     Resting HR 62 bpm     Resting BP 140/88 mmHg     Max Ex. HR 76 bpm     Max Ex. BP 164/80 mmHg     2 Minute Post BP 144/84 mmHg        Initial Exercise Prescription:     Initial Exercise Prescription - 10/18/15 1500    Date of Initial Exercise RX and Referring Provider   Date 10/18/15   Referring Provider Daneen Schick, MD   Bike   Level 0.5   Minutes 10   METs 2   NuStep   Level 3   Minutes 10   METs 2   Track   Laps 10   Minutes 10   METs 2.74   Prescription Details   Frequency (times per week) 2   Duration Progress to 30 minutes of continuous aerobic without signs/symptoms of physical distress   Intensity   THRR 40-80% of Max Heartrate 60-121   Ratings of Perceived Exertion 11-13   Perceived Dyspnea 0-4   Progression   Progression Continue to progress workloads to maintain intensity without signs/symptoms of physical distress.   Resistance Training   Training Prescription Yes   Weight 2lbs   Reps 10-12      Perform Capillary Blood Glucose checks as needed.  Exercise Prescription Changes:   Exercise Comments:   Discharge Exercise Prescription (Final Exercise Prescription Changes):   Nutrition:  Target Goals: Understanding of nutrition guidelines, daily intake of sodium 1500mg , cholesterol 200mg , calories 30% from fat and 7% or less from saturated fats, daily to have 5 or more servings of fruits and vegetables.  Biometrics:     Pre Biometrics - 10/18/15 1532    Pre Biometrics   Waist Circumference 41.5 inches   Hip Circumference 42 inches   Waist to Hip Ratio 0.99 %   Triceps Skinfold 14 mm   % Body Fat 28.5 %   Grip Strength 44 kg   Flexibility 11 in   Single Leg Stand 19 seconds        Nutrition Therapy Plan and Nutrition Goals:   Nutrition Discharge: Nutrition Scores:   Nutrition Goals Re-Evaluation:   Psychosocial: Target Goals: Acknowledge presence or absence of depression, maximize  coping skills, provide positive support system. Participant is able to verbalize types and ability to use techniques and skills needed for reducing stress and depression.  Initial Review & Psychosocial Screening:     Initial Psych Review & Screening - 10/19/15 [place]? Yes   Barriers   Psychosocial barriers to participate in program There are no identifiable barriers or psychosocial needs.   Screening Interventions   Interventions Encouraged to exercise      Quality of Life Scores:     Quality of Life - 10/18/15 1338    Quality of Life Scores   Health/Function Pre 24.04 %   Socioeconomic Pre 25 %   Psych/Spiritual Pre 22.5 %   Family Pre 27 %   GLOBAL Pre 24.38 %      PHQ-9:     Recent Review Flowsheet Data    Depression screen PHQ 2/9 08/30/2015   Decreased Interest 0   Down, Depressed, Hopeless 0   PHQ - 2 Score 0      Psychosocial Evaluation and Intervention:   Psychosocial Re-Evaluation:   Vocational Rehabilitation: Provide vocational rehab assistance to qualifying candidates.   Vocational Rehab Evaluation & Intervention:     Vocational Rehab - 10/19/15 1440    Initial Vocational Rehab Evaluation & Intervention   Assessment shows need for Vocational Rehabilitation No      Education: Education Goals: Education classes will be provided on a weekly basis, covering required topics. Participant will state understanding/return demonstration of topics presented.  Learning Barriers/Preferences:     Learning Barriers/Preferences - 10/19/15 1441    Learning Barriers/Preferences   Learning Barriers Sight  reading glasses      Education Topics: Count Your Pulse:  -Group instruction provided by  verbal instruction, demonstration, patient participation and written materials to support subject.  Instructors address importance of being able to find your pulse and how to count your pulse when at home without a heart monitor.  Patients get hands on experience counting their pulse with staff help and individually.   Heart Attack, Angina, and Risk Factor Modification:  -Group instruction provided by verbal instruction, video, and written materials to support subject.  Instructors address signs and symptoms of angina and heart attacks.    Also discuss risk factors for heart disease and how to make changes to improve heart health risk factors.   Functional Fitness:  -Group instruction provided by verbal instruction, demonstration, patient participation, and written materials to support subject.  Instructors address safety measures for doing things around the house.  Discuss how to get up and down off the floor, how to pick things up properly, how to safely get out of a chair without assistance, and balance training.   Meditation and Mindfulness:  -Group instruction provided by verbal instruction, patient participation, and written materials to support subject.  Instructor addresses importance of mindfulness and meditation practice to help reduce stress and improve awareness.  Instructor also leads participants through a meditation exercise.    Stretching for Flexibility and Mobility:  -Group instruction provided by verbal instruction, patient participation, and written materials to support subject.  Instructors lead participants through series of stretches that are designed to increase flexibility thus improving mobility.  These stretches are additional exercise for major muscle groups that are typically performed during regular warm up and cool down.   Hands Only CPR Anytime:  -Group instruction provided by verbal instruction, video, patient participation and written materials to support  subject.  Instructors co-teach with AHA video for hands only CPR.  Participants get hands on experience with mannequins.   Nutrition I class: Heart Healthy Eating:  -Group instruction provided by PowerPoint slides, verbal discussion, and written materials to support subject matter. The instructor gives an explanation and review of the Therapeutic Lifestyle Changes diet recommendations, which includes a discussion on lipid goals, dietary fat, sodium, fiber, plant stanol/sterol esters, sugar, and the components of a well-balanced, healthy diet.   Nutrition II class: Lifestyle Skills:  -Group instruction provided by PowerPoint slides, verbal discussion, and written materials to support subject matter. The instructor gives an explanation and review of label reading, grocery shopping for heart health, heart healthy recipe modifications, and ways to make healthier choices when eating out.   Diabetes Question & Answer:  -Group instruction provided by PowerPoint slides, verbal discussion, and written materials to support subject matter. The instructor gives an explanation and review of diabetes co-morbidities, pre- and post-prandial blood glucose goals, pre-exercise blood glucose goals, signs, symptoms, and treatment of hypoglycemia and hyperglycemia, and foot care basics.   Diabetes Blitz:  -Group instruction provided by PowerPoint slides, verbal discussion, and written materials to support subject matter. The instructor gives an explanation and review of the physiology behind type 1 and type 2 diabetes, diabetes medications and rational behind using different medications, pre- and post-prandial blood glucose recommendations and Hemoglobin A1c goals, diabetes diet, and exercise including blood glucose guidelines for exercising safely.    Portion Distortion:  -Group instruction provided by PowerPoint slides, verbal discussion, written materials, and food models to support subject matter. The instructor  gives an explanation of serving size versus portion size, changes in portions sizes over the last 20 years, and what consists of a serving from each food group.   Stress Management:  -Group instruction provided by verbal instruction, video, and written materials to support subject matter.  Instructors review role of stress in heart disease and how to cope with stress positively.     Exercising on Your Own:  -Group instruction provided by verbal instruction, power point, and written materials to support subject.  Instructors discuss benefits of exercise, components of exercise, frequency and intensity of exercise, and end points for exercise.  Also discuss use of nitroglycerin and activating EMS.  Review options of places to exercise outside of rehab.  Review guidelines for sex with heart disease.   Cardiac Drugs I:  -Group instruction provided by verbal instruction and written materials to support subject.  Instructor reviews cardiac drug classes: antiplatelets, anticoagulants, beta blockers, and statins.  Instructor discusses reasons, side effects, and lifestyle considerations for each drug class.   Cardiac Drugs II:  -Group instruction provided by verbal instruction and written materials to support subject.  Instructor reviews cardiac drug classes: angiotensin converting enzyme inhibitors (ACE-I), angiotensin II receptor blockers (ARBs), nitrates, and calcium channel blockers.  Instructor discusses reasons, side effects, and lifestyle considerations for each drug class.   Anatomy and Physiology of the Circulatory System:  -Group instruction provided by verbal instruction, video, and written materials to support subject.  Reviews functional anatomy of heart, how it relates to various diagnoses, and what role the heart plays in the overall system.   Knowledge Questionnaire Score:     Knowledge Questionnaire Score - 10/18/15 1337    Knowledge Questionnaire Score   Pre Score 20/24       Core Components/Risk Factors/Patient Goals at Admission:     Personal Goals and Risk Factors at Admission - 10/18/15 [id number]  Core Components/Risk Factors/Patient Goals on Admission    Weight Management Yes;Weight Loss   Intervention Weight Management: Develop a combined nutrition and exercise program designed to reach desired caloric intake, while maintaining appropriate intake of nutrient and fiber, sodium and fats, and appropriate energy expenditure required for the weight goal.;Weight Management: Provide education and appropriate resources to help participant work on and attain dietary goals.;Weight Management/Obesity: Establish reasonable short term and long term weight goals.   Expected Outcomes Short Term: Continue to assess and modify interventions until short term weight is achieved;Weight Maintenance: Understanding of the daily nutrition guidelines, which includes 25-35% calories from fat, 7% or less cal from saturated fats, less than 200mg  cholesterol, less than 1.5gm of sodium, & 5 or more servings of fruits and vegetables daily;Weight Loss: Understanding of general recommendations for a balanced deficit meal plan, which promotes 1-2 lb weight loss per week and includes a negative energy balance of [phone] kcal/d;Long Term: Adherence to nutrition and physical activity/exercise program aimed toward attainment of established weight goal;Understanding recommendations for meals to include 15-35% energy as protein, 25-35% energy from fat, 35-60% energy from carbohydrates, less than 200mg  of dietary cholesterol, 20-35 gm of total fiber daily;Understanding of distribution of calorie intake throughout the day with the consumption of 4-5 meals/snacks   Diabetes Yes   Intervention Provide education about proper nutrition, including hydration, and aerobic/resistive exercise prescription along with prescribed medications to achieve blood glucose in normal ranges: Fasting glucose 65-99 mg/dL;Provide  education about signs/symptoms and action to take for hypo/hyperglycemia.   Expected Outcomes Short Term: Participant verbalizes understanding of the signs/symptoms and immediate care of hyper/hypoglycemia, proper foot care and importance of medication, aerobic/resistive exercise and nutrition plan for blood glucose control.;Long Term: Attainment of HbA1C < 7%.   Hypertension Yes   Intervention Provide education on lifestyle modifcations including regular physical activity/exercise, weight management, moderate sodium restriction and increased consumption of fresh fruit, vegetables, and low fat dairy, alcohol moderation, and smoking cessation.;Monitor prescription use compliance.   Expected Outcomes Short Term: Continued assessment and intervention until BP is < 140/53mm HG in hypertensive participants. < 130/17mm HG in hypertensive participants with diabetes, heart failure or chronic kidney disease.;Long Term: Maintenance of blood pressure at goal levels.   Lipids Yes   Intervention Provide education and support for participant on nutrition & aerobic/resistive exercise along with prescribed medications to achieve LDL 70mg , HDL >40mg .   Expected Outcomes Short Term: Participant states understanding of desired cholesterol values and is compliant with medications prescribed. Participant is following exercise prescription and nutrition guidelines.;Long Term: Cholesterol controlled with medications as prescribed, with individualized exercise RX and with personalized nutrition plan. Value goals: LDL < 70mg , HDL > 40 mg.   Personal Goal Other Yes   Personal Goal short : Be better than before surgery-long: Be more active   Intervention Provide exercise and nutrition guidelines to assist with increased activity levels and weigthloss   Expected Outcomes Be able to lose 5-10lbs and increased exercise capacity      Core Components/Risk Factors/Patient Goals Review:    Core Components/Risk Factors/Patient  Goals at Discharge (Final Review):    ITP Comments:     ITP Comments      10/18/15 0905           ITP Comments Dr. Fransico Him, Medical Director          Comments: Patient attended orientation from 0800 to 1000 to review rules and guidelines for program. Completed 6 minute walk test, Intitial ITP, and  exercise prescription.  . Telemetry-Sinus rhythm with a bundle branch block this has been previously documented . Moderate blood pressure elevation noted. Exit blood pressure 144/84.  Asymptomatic.Barnet Pall, RN,BSN 10/19/2015 2:49 PM Will continue to monitor BP.

## 2015-10-18 NOTE — Progress Notes (Signed)
Cardiac Rehab Medication Review by a Pharmacist  Does the patient  feel that his/her medications are working for him/her?  yes  Has the patient been experiencing any side effects to the medications prescribed?  no  Does the patient measure his/her own blood pressure or blood glucose at home?  Yes, checks blood sugar  Does the patient have any problems obtaining medications due to transportation or finances?   no  Understanding of regimen: excellent Understanding of indications: excellent Potential of compliance: excellent   Pharmacist comments: [name] [name] is a pleasant 67 yom presenting to cardiac rehab. He has a good overall understanding of his medication regimen and rarely misses any doses. He was slighly confused on the dose of his metoprolol but we discussed the regimen and he noe understands. He did not have any other medication-related questions or concerns at this time.   Jason Hauge C. Lennox Grumbles, PharmD Pharmacy Resident  Pager: [phone] 10/18/2015 9:21 AM

## 2015-10-19 ENCOUNTER — Encounter (HOSPITAL_COMMUNITY): Payer: Self-pay

## 2015-10-22 ENCOUNTER — Encounter (HOSPITAL_COMMUNITY): Payer: PPO

## 2015-10-24 ENCOUNTER — Encounter (HOSPITAL_COMMUNITY)
Admission: RE | Admit: 2015-10-24 | Discharge: 2015-10-24 | Disposition: A | Discharge status: Home / Self Care | Payer: PPO | Source: Ambulatory Visit | Attending: Interventional Cardiology | Admitting: Interventional Cardiology

## 2015-10-24 ENCOUNTER — Encounter (HOSPITAL_COMMUNITY): Payer: Self-pay

## 2015-10-24 DIAGNOSIS — I480 Paroxysmal atrial fibrillation: Secondary | ICD-10-CM | POA: Insufficient documentation

## 2015-10-24 DIAGNOSIS — Z951 Presence of aortocoronary bypass graft: Secondary | ICD-10-CM | POA: Diagnosis not present

## 2015-10-24 DIAGNOSIS — E785 Hyperlipidemia, unspecified: Secondary | ICD-10-CM | POA: Diagnosis not present

## 2015-10-24 DIAGNOSIS — I10 Essential (primary) hypertension: Secondary | ICD-10-CM | POA: Insufficient documentation

## 2015-10-24 DIAGNOSIS — I2510 Atherosclerotic heart disease of native coronary artery without angina pectoris: Secondary | ICD-10-CM | POA: Diagnosis not present

## 2015-10-24 LAB — GLUCOSE, CAPILLARY
GLUCOSE-CAPILLARY: 74 mg/dL (ref 65–99)
GLUCOSE-CAPILLARY: 123 mg/dL — AB (ref 65–99)

## 2015-10-24 NOTE — Progress Notes (Signed)
Daily Session Note  Patient Details  Name: [name] [name] MRN: [id number] Date of Birth: [birth year] Referring Provider:        CARDIAC REHAB PHASE II ORIENTATION from 10/18/2015 in [place]   Referring Provider  Daneen Schick, MD      Encounter Date: 10/24/2015  Check In:     Session Check In - 10/24/15 1403    Check-In   Location [facility]   Staff Present Andi Hence, RN, BSN;Amber Fair, MS, ACSM RCEP, Exercise Physiologist;Carlette Wilber Oliphant, RN, Marga Melnick, RN, BSN   Supervising physician immediately available to respond to emergencies [facility] immediately available   Physician(s) Dr. Eulas Post   Medication changes reported     No   Fall or balance concerns reported    No   Warm-up and Cool-down Performed as group-led instruction   Resistance Training Performed No   VAD Patient? No   Pain Assessment   Currently in Pain? No/denies      Capillary Blood Glucose: Results for orders placed or performed during the hospital encounter of 10/24/15 (from the past 24 hour(s))  Glucose, capillary     Status: None   Collection Time: 10/24/15  2:19 PM  Result Value Ref Range   Glucose-Capillary 74 65 - 99 mg/dL  Glucose, capillary     Status: Abnormal   Collection Time: 10/24/15  3:08 PM  Result Value Ref Range   Glucose-Capillary 123 (H) 65 - 99 mg/dL     Goals Met:  Exercise tolerated well  Goals Unmet:  Not Applicable  Comments: Pt started cardiac rehab today.  Pt tolerated light exercise without difficulty. VSS, telemetry-sinus rhythm, rare PVC, asymptomatic.  Medication list reconciled. Pt denies barriers to medicaiton compliance.  PSYCHOSOCIAL ASSESSMENT:  PHQ-0. Pt exhibits positive coping skills, hopeful outlook with supportive family. No psychosocial needs identified at this time, no psychosocial interventions necessary.    Pt enjoys building small projects with friends.  Pt goals for cardiac rehab are to  increase stamina and lose weight. Pt encouraged to particpate in nutrition education classes and exercise to increase ability to meet these goals.    Pt oriented to exercise equipment and routine.    Understanding verbalized.   Dr. Fransico Him is Medical Director for Cardiac Rehab at [facility].

## 2015-10-26 ENCOUNTER — Encounter (HOSPITAL_COMMUNITY)
Admission: RE | Admit: 2015-10-26 | Discharge: 2015-10-26 | Disposition: A | Discharge status: Home / Self Care | Payer: PPO | Source: Ambulatory Visit | Attending: Interventional Cardiology | Admitting: Interventional Cardiology

## 2015-10-26 DIAGNOSIS — Z951 Presence of aortocoronary bypass graft: Secondary | ICD-10-CM | POA: Diagnosis not present

## 2015-10-26 LAB — GLUCOSE, CAPILLARY
GLUCOSE-CAPILLARY: 121 mg/dL — AB (ref 65–99)
Glucose-Capillary: 111 mg/dL — ABNORMAL HIGH (ref 65–99)

## 2015-10-29 ENCOUNTER — Encounter (HOSPITAL_COMMUNITY): Payer: PPO

## 2015-10-29 MED FILL — METFORMIN HCL ER 500 MG TAB: 500 | 30 days supply | Qty: 60 | Fill #4

## 2015-10-31 ENCOUNTER — Encounter (HOSPITAL_COMMUNITY)
Admission: RE | Admit: 2015-10-31 | Discharge: 2015-10-31 | Disposition: A | Discharge status: Home / Self Care | Payer: PPO | Source: Ambulatory Visit | Attending: Interventional Cardiology | Admitting: Interventional Cardiology

## 2015-10-31 DIAGNOSIS — Z951 Presence of aortocoronary bypass graft: Secondary | ICD-10-CM

## 2015-10-31 LAB — GLUCOSE, CAPILLARY: Glucose-Capillary: 109 mg/dL — ABNORMAL HIGH (ref 65–99)

## 2015-11-01 ENCOUNTER — Ambulatory Visit (INDEPENDENT_AMBULATORY_CARE_PROVIDER_SITE_OTHER): Payer: PPO | Admitting: *Deleted

## 2015-11-01 DIAGNOSIS — I4891 Unspecified atrial fibrillation: Secondary | ICD-10-CM | POA: Diagnosis not present

## 2015-11-01 DIAGNOSIS — Z5181 Encounter for therapeutic drug level monitoring: Secondary | ICD-10-CM | POA: Diagnosis not present

## 2015-11-01 DIAGNOSIS — Z951 Presence of aortocoronary bypass graft: Secondary | ICD-10-CM | POA: Diagnosis not present

## 2015-11-01 LAB — POCT INR: INR: 1.9

## 2015-11-02 ENCOUNTER — Encounter (HOSPITAL_COMMUNITY)
Admission: RE | Admit: 2015-11-02 | Discharge: 2015-11-02 | Disposition: A | Discharge status: Home / Self Care | Payer: PPO | Source: Ambulatory Visit | Attending: Interventional Cardiology | Admitting: Interventional Cardiology

## 2015-11-02 DIAGNOSIS — Z951 Presence of aortocoronary bypass graft: Secondary | ICD-10-CM

## 2015-11-02 LAB — GLUCOSE, CAPILLARY: Glucose-Capillary: 109 mg/dL — ABNORMAL HIGH (ref 65–99)

## 2015-11-02 NOTE — Progress Notes (Signed)
Reviewed home exercise with pt today.  Pt plans to wall for exercise on Tues./Thurs/Sat. In addition to coming to [facility].  Reviewed THR, pulse, RPE, sign and symptoms, and when to call 911 or MD.  Also discussed weather considerations and indoor options.  Pt voiced understanding.    [name] [name] [employer]

## 2015-11-02 NOTE — Progress Notes (Signed)
Cardiac Individual Treatment Plan  Patient Details  Name: DEMARCO LODUCA MRN: [id number] Date of Birth: 09-Sep-1945 Referring Provider:        CARDIAC REHAB PHASE II ORIENTATION from 10/18/2015 in [place]   Referring Provider  Daneen Schick, MD      Initial Encounter Date:       CARDIAC REHAB PHASE II ORIENTATION from 10/18/2015 in [place]   Date  10/18/15   Referring Provider  Daneen Schick, MD      Visit Diagnosis: S/P CABG x 4  Patient's Home Medications on Admission:  Current outpatient prescriptions:  .  aspirin 81 MG tablet, Take 81 mg by mouth daily., Disp: , Rfl:  .  Cholecalciferol (VITAMIN D) 2000 units tablet, Take 2,000 Units by mouth 3 (three) times a week. , Disp: , Rfl:  .  Cyanocobalamin (B-12) 2500 MCG TABS, Take 1 tablet by mouth 3 (three) times a week., Disp: , Rfl:  .  irbesartan (AVAPRO) 300 MG tablet, Take 300 mg by mouth daily., Disp: , Rfl:  .  metFORMIN (GLUCOPHAGE-XR) 500 MG 24 hr tablet, Take 1,000 mg by mouth daily. , Disp: , Rfl: 11 .  metoprolol tartrate (LOPRESSOR) 25 MG tablet, Take 1.5 tablets (37.5 mg total) by mouth 2 (two) times daily., Disp: 270 tablet, Rfl: 3 .  simvastatin (ZOCOR) 20 MG tablet, Take 20 mg by mouth daily., Disp: , Rfl:  .  triamcinolone cream (KENALOG) 0.1 %, Apply 1 application topically 2 (two) times daily as needed. Reported on 09/04/2015, Disp: , Rfl:  .  warfarin (COUMADIN) 2.5 MG tablet, Take 1 tablet (2.5 mg total) by mouth daily. And as directed by the coumadin clinic, Disp: 100 tablet, Rfl: 1  Past Medical History: Past Medical History  Diagnosis Date  . Hypertension   . Diabetes mellitus   . HLD (hyperlipidemia)   . BPH (benign prostatic hyperplasia)     [facility]  . Nephrolithiasis   . LBBB (left bundle branch block)   . Coronary artery disease     a. Myoview 4/17: EF 47%, anteroseptal, apical anterior, apical septal, apical scar with  peri-infarct ischemia, inferoseptal, inferior, inferolateral, apical inferior infarct with peri-infarct ischemia, high risk // b. LHC 4/17: Proximal RCA 99%, proximal LCx 100%, mid LAD 90%, distal LAD 60%, EF 55%  . History of echocardiogram     a. Echo 4/17:  EF 60-65%, normal wall motion, grade 1 diastolic dysfunction  . History of Doppler ultrasound     a. Carotid US 4/17:  bilateral ICA 1-39%    Tobacco Use: History  Smoking status  . Never Smoker   Smokeless tobacco  . Not on file    Labs:     Recent Review Flowsheet Data    Labs for ITP Cardiac and Pulmonary Rehab Latest Ref Rng 08/13/2015 08/13/2015 08/13/2015 08/13/2015 08/13/2015   PHART 7.350 - 7.450 7.366 7.341(L) - 7.446 7.353   PCO2ART 35.0 - 45.0 mmHg 39.0 44.6 - 32.3(L) 41.2   HCO3 20.0 - 24.0 mEq/L 22.7 23.8 - 22.1 22.8   TCO2 0 - 100 mmol/L 24 25 22 23 24    ACIDBASEDEF 0.0 - 2.0 mmol/L 3.0(H) 2.0 - 1.0 2.0   O2SAT - 92.0 90.0 - 96.0 96.0      Capillary Blood Glucose: Lab Results  Component Value Date   GLUCAP 133* 11/07/2015   GLUCAP 92 11/07/2015   GLUCAP 96 11/07/2015   GLUCAP 109* 11/02/2015  GLUCAP 109* 10/31/2015     Exercise Target Goals:    Exercise Program Goal: Individual exercise prescription set with THRR, safety & activity barriers. Participant demonstrates ability to understand and report RPE using BORG scale, to self-measure pulse accurately, and to acknowledge the importance of the exercise prescription.  Exercise Prescription Goal: Starting with aerobic activity 30 plus minutes a day, 3 days per week for initial exercise prescription. Provide home exercise prescription and guidelines that participant acknowledges understanding prior to discharge.  Activity Barriers & Risk Stratification:     Activity Barriers & Cardiac Risk Stratification - 10/18/15 0906    Activity Barriers & Cardiac Risk Stratification   Activity Barriers None   Cardiac Risk Stratification High      6 Minute  Walk:     6 Minute Walk      10/18/15 1533       6 Minute Walk   Phase Initial     Distance 1400 feet     Walk Time 6 minutes     # of Rest Breaks 0     MPH 2.65     METS 3     RPE 11     VO2 Peak 10.6     Symptoms No     Resting HR 62 bpm     Resting BP 140/88 mmHg     Max Ex. HR 76 bpm     Max Ex. BP 164/80 mmHg     2 Minute Post BP 144/84 mmHg        Initial Exercise Prescription:     Initial Exercise Prescription - 10/18/15 1500    Date of Initial Exercise RX and Referring Provider   Date 10/18/15   Referring Provider Daneen Schick, MD   Bike   Level 0.5   Minutes 10   METs 2   NuStep   Level 3   Minutes 10   METs 2   Track   Laps 10   Minutes 10   METs 2.74   Prescription Details   Frequency (times per week) 2   Duration Progress to 30 minutes of continuous aerobic without signs/symptoms of physical distress   Intensity   THRR 40-80% of Max Heartrate 60-121   Ratings of Perceived Exertion 11-13   Perceived Dyspnea 0-4   Progression   Progression Continue to progress workloads to maintain intensity without signs/symptoms of physical distress.   Resistance Training   Training Prescription Yes   Weight 2lbs   Reps 10-12      Perform Capillary Blood Glucose checks as needed.  Exercise Prescription Changes:      Exercise Prescription Changes      11/01/15 1700           Exercise Review   Progression Yes       Response to Exercise   Blood Pressure (Admit) 130/80 mmHg       Blood Pressure (Exercise) 154/76 mmHg       Blood Pressure (Exit) 130/80 mmHg       Heart Rate (Admit) 62 bpm       Heart Rate (Exercise) 76 bpm       Heart Rate (Exit) 56 bpm       Rating of Perceived Exertion (Exercise) 11       Duration Progress to 30 minutes of continuous aerobic without signs/symptoms of physical distress       Intensity THRR unchanged       Progression   Progression Continue to progress  workloads to maintain intensity without signs/symptoms of  physical distress.       Average METs 2.9       Resistance Training   Training Prescription Yes       Weight 1lb       Reps 10-12       Bike   Level 0.8       Minutes 10       METs 2.81       NuStep   Level 3       Minutes 10       METs 4.1       Track   Laps 13       Minutes 10       METs 3.26          Exercise Comments:      Exercise Comments      11/01/15 1702           Exercise Comments Reviewed METs and activity levels with pt. Pt is tolerating exercise well; will continue to monitor exercise progression          Discharge Exercise Prescription (Final Exercise Prescription Changes):     Exercise Prescription Changes - 11/01/15 1700    Exercise Review   Progression Yes   Response to Exercise   Blood Pressure (Admit) 130/80 mmHg   Blood Pressure (Exercise) 154/76 mmHg   Blood Pressure (Exit) 130/80 mmHg   Heart Rate (Admit) 62 bpm   Heart Rate (Exercise) 76 bpm   Heart Rate (Exit) 56 bpm   Rating of Perceived Exertion (Exercise) 11   Duration Progress to 30 minutes of continuous aerobic without signs/symptoms of physical distress   Intensity THRR unchanged   Progression   Progression Continue to progress workloads to maintain intensity without signs/symptoms of physical distress.   Average METs 2.9   Resistance Training   Training Prescription Yes   Weight 1lb   Reps 10-12   Bike   Level 0.8   Minutes 10   METs 2.81   NuStep   Level 3   Minutes 10   METs 4.1   Track   Laps 13   Minutes 10   METs 3.26      Nutrition:  Target Goals: Understanding of nutrition guidelines, daily intake of sodium 1500mg , cholesterol 200mg , calories 30% from fat and 7% or less from saturated fats, daily to have 5 or more servings of fruits and vegetables.  Biometrics:     Pre Biometrics - 10/18/15 1532    Pre Biometrics   Waist Circumference 41.5 inches   Hip Circumference 42 inches   Waist to Hip Ratio 0.99 %   Triceps Skinfold 14 mm   % Body Fat  28.5 %   Grip Strength 44 kg   Flexibility 11 in   Single Leg Stand 19 seconds       Nutrition Therapy Plan and Nutrition Goals:   Nutrition Discharge: Nutrition Scores:     Nutrition Assessments - 10/29/15 1523    MEDFICTS Scores   Pre Score 27      Nutrition Goals Re-Evaluation:   Psychosocial: Target Goals: Acknowledge presence or absence of depression, maximize coping skills, provide positive support system. Participant is able to verbalize types and ability to use techniques and skills needed for reducing stress and depression.  Initial Review & Psychosocial Screening:     Initial Psych Review & Screening - 10/24/15 [place]? Yes  Barriers   Psychosocial barriers to participate in program The patient should benefit from training in stress management and relaxation.;There are no identifiable barriers or psychosocial needs.   Screening Interventions   Interventions Encouraged to exercise      Quality of Life Scores:     Quality of Life - 10/18/15 1338    Quality of Life Scores   Health/Function Pre 24.04 %   Socioeconomic Pre 25 %   Psych/Spiritual Pre 22.5 %   Family Pre 27 %   GLOBAL Pre 24.38 %      PHQ-9:     Recent Review Flowsheet Data    Depression screen [facility] 2/9 10/24/2015 08/30/2015   Decreased Interest 0 0   Down, Depressed, Hopeless 0 0   PHQ - 2 Score 0 0      Psychosocial Evaluation and Intervention:   Psychosocial Re-Evaluation:     Psychosocial Re-Evaluation      11/02/15 1447           Psychosocial Re-Evaluation   Interventions Stress management education;Relaxation education;Encouraged to attend Cardiac Rehabilitation for the exercise       Comments pt        Continued Psychosocial Services Needed Yes          Vocational Rehabilitation: Provide vocational rehab assistance to qualifying candidates.   Vocational Rehab Evaluation & Intervention:     Vocational Rehab - 10/19/15  1440    Initial Vocational Rehab Evaluation & Intervention   Assessment shows need for Vocational Rehabilitation No      Education: Education Goals: Education classes will be provided on a weekly basis, covering required topics. Participant will state understanding/return demonstration of topics presented.  Learning Barriers/Preferences:     Learning Barriers/Preferences - 10/19/15 1441    Learning Barriers/Preferences   Learning Barriers Sight  reading glasses      Education Topics: Count Your Pulse:  -Group instruction provided by verbal instruction, demonstration, patient participation and written materials to support subject.  Instructors address importance of being able to find your pulse and how to count your pulse when at home without a heart monitor.  Patients get hands on experience counting their pulse with staff help and individually.      CARDIAC REHAB PHASE II EXERCISE from 10/31/2015 in [place]   Date  10/26/15   Instruction Review Code  2- meets goals/outcomes      Heart Attack, Angina, and Risk Factor Modification:  -Group instruction provided by verbal instruction, video, and written materials to support subject.  Instructors address signs and symptoms of angina and heart attacks.    Also discuss risk factors for heart disease and how to make changes to improve heart health risk factors.   Functional Fitness:  -Group instruction provided by verbal instruction, demonstration, patient participation, and written materials to support subject.  Instructors address safety measures for doing things around the house.  Discuss how to get up and down off the floor, how to pick things up properly, how to safely get out of a chair without assistance, and balance training.   Meditation and Mindfulness:  -Group instruction provided by verbal instruction, patient participation, and written materials to support subject.  Instructor addresses  importance of mindfulness and meditation practice to help reduce stress and improve awareness.  Instructor also leads participants through a meditation exercise.       CARDIAC REHAB PHASE II EXERCISE from 10/31/2015 in [place]   Date  10/24/15   Instruction Review Code  2- meets goals/outcomes      Stretching for Flexibility and Mobility:  -Group instruction provided by verbal instruction, patient participation, and written materials to support subject.  Instructors lead participants through series of stretches that are designed to increase flexibility thus improving mobility.  These stretches are additional exercise for major muscle groups that are typically performed during regular warm up and cool down.   Hands Only CPR Anytime:  -Group instruction provided by verbal instruction, video, patient participation and written materials to support subject.  Instructors co-teach with AHA video for hands only CPR.  Participants get hands on experience with mannequins.   Nutrition I class: Heart Healthy Eating:  -Group instruction provided by PowerPoint slides, verbal discussion, and written materials to support subject matter. The instructor gives an explanation and review of the Therapeutic Lifestyle Changes diet recommendations, which includes a discussion on lipid goals, dietary fat, sodium, fiber, plant stanol/sterol esters, sugar, and the components of a well-balanced, healthy diet.   Nutrition II class: Lifestyle Skills:  -Group instruction provided by PowerPoint slides, verbal discussion, and written materials to support subject matter. The instructor gives an explanation and review of label reading, grocery shopping for heart health, heart healthy recipe modifications, and ways to make healthier choices when eating out.   Diabetes Question & Answer:  -Group instruction provided by PowerPoint slides, verbal discussion, and written materials to support  subject matter. The instructor gives an explanation and review of diabetes co-morbidities, pre- and post-prandial blood glucose goals, pre-exercise blood glucose goals, signs, symptoms, and treatment of hypoglycemia and hyperglycemia, and foot care basics.   Diabetes Blitz:  -Group instruction provided by PowerPoint slides, verbal discussion, and written materials to support subject matter. The instructor gives an explanation and review of the physiology behind type 1 and type 2 diabetes, diabetes medications and rational behind using different medications, pre- and post-prandial blood glucose recommendations and Hemoglobin A1c goals, diabetes diet, and exercise including blood glucose guidelines for exercising safely.    Portion Distortion:  -Group instruction provided by PowerPoint slides, verbal discussion, written materials, and food models to support subject matter. The instructor gives an explanation of serving size versus portion size, changes in portions sizes over the last 20 years, and what consists of a serving from each food group.   Stress Management:  -Group instruction provided by verbal instruction, video, and written materials to support subject matter.  Instructors review role of stress in heart disease and how to cope with stress positively.     Exercising on Your Own:  -Group instruction provided by verbal instruction, power point, and written materials to support subject.  Instructors discuss benefits of exercise, components of exercise, frequency and intensity of exercise, and end points for exercise.  Also discuss use of nitroglycerin and activating EMS.  Review options of places to exercise outside of rehab.  Review guidelines for sex with heart disease.   Cardiac Drugs I:  -Group instruction provided by verbal instruction and written materials to support subject.  Instructor reviews cardiac drug classes: antiplatelets, anticoagulants, beta blockers, and statins.   Instructor discusses reasons, side effects, and lifestyle considerations for each drug class.   Cardiac Drugs II:  -Group instruction provided by verbal instruction and written materials to support subject.  Instructor reviews cardiac drug classes: angiotensin converting enzyme inhibitors (ACE-I), angiotensin II receptor blockers (ARBs), nitrates, and calcium channel blockers.  Instructor discusses reasons, side effects, and lifestyle considerations for each drug class.  CARDIAC REHAB PHASE II EXERCISE from 10/31/2015 in [place]   Date  10/31/15   Instruction Review Code  2- meets goals/outcomes      Anatomy and Physiology of the Circulatory System:  -Group instruction provided by verbal instruction, video, and written materials to support subject.  Reviews functional anatomy of heart, how it relates to various diagnoses, and what role the heart plays in the overall system.   Knowledge Questionnaire Score:     Knowledge Questionnaire Score - 10/18/15 1337    Knowledge Questionnaire Score   Pre Score 20/24      Core Components/Risk Factors/Patient Goals at Admission:     Personal Goals and Risk Factors at Admission - 10/18/15 0907    Core Components/Risk Factors/Patient Goals on Admission    Weight Management Yes;Weight Loss   Intervention Weight Management: Develop a combined nutrition and exercise program designed to reach desired caloric intake, while maintaining appropriate intake of nutrient and fiber, sodium and fats, and appropriate energy expenditure required for the weight goal.;Weight Management: Provide education and appropriate resources to help participant work on and attain dietary goals.;Weight Management/Obesity: Establish reasonable short term and long term weight goals.   Expected Outcomes Short Term: Continue to assess and modify interventions until short term weight is achieved;Weight Maintenance: Understanding of the daily  nutrition guidelines, which includes 25-35% calories from fat, 7% or less cal from saturated fats, less than 200mg  cholesterol, less than 1.5gm of sodium, & 5 or more servings of fruits and vegetables daily;Weight Loss: Understanding of general recommendations for a balanced deficit meal plan, which promotes 1-2 lb weight loss per week and includes a negative energy balance of [phone] kcal/d;Long Term: Adherence to nutrition and physical activity/exercise program aimed toward attainment of established weight goal;Understanding recommendations for meals to include 15-35% energy as protein, 25-35% energy from fat, 35-60% energy from carbohydrates, less than 200mg  of dietary cholesterol, 20-35 gm of total fiber daily;Understanding of distribution of calorie intake throughout the day with the consumption of 4-5 meals/snacks   Diabetes Yes   Intervention Provide education about proper nutrition, including hydration, and aerobic/resistive exercise prescription along with prescribed medications to achieve blood glucose in normal ranges: Fasting glucose 65-99 mg/dL;Provide education about signs/symptoms and action to take for hypo/hyperglycemia.   Expected Outcomes Short Term: Participant verbalizes understanding of the signs/symptoms and immediate care of hyper/hypoglycemia, proper foot care and importance of medication, aerobic/resistive exercise and nutrition plan for blood glucose control.;Long Term: Attainment of HbA1C < 7%.   Hypertension Yes   Intervention Provide education on lifestyle modifcations including regular physical activity/exercise, weight management, moderate sodium restriction and increased consumption of fresh fruit, vegetables, and low fat dairy, alcohol moderation, and smoking cessation.;Monitor prescription use compliance.   Expected Outcomes Short Term: Continued assessment and intervention until BP is < 140/62mm HG in hypertensive participants. < 130/85mm HG in hypertensive participants  with diabetes, heart failure or chronic kidney disease.;Long Term: Maintenance of blood pressure at goal levels.   Lipids Yes   Intervention Provide education and support for participant on nutrition & aerobic/resistive exercise along with prescribed medications to achieve LDL 70mg , HDL >40mg .   Expected Outcomes Short Term: Participant states understanding of desired cholesterol values and is compliant with medications prescribed. Participant is following exercise prescription and nutrition guidelines.;Long Term: Cholesterol controlled with medications as prescribed, with individualized exercise RX and with personalized nutrition plan. Value goals: LDL < 70mg , HDL > 40 mg.   Personal Goal Other Yes  Personal Goal short : Be better than before surgery-long: Be more active   Intervention Provide exercise and nutrition guidelines to assist with increased activity levels and weigthloss   Expected Outcomes Be able to lose 5-10lbs and increased exercise capacity      Core Components/Risk Factors/Patient Goals Review:    Core Components/Risk Factors/Patient Goals at Discharge (Final Review):    ITP Comments:     ITP Comments      10/18/15 0905           ITP Comments Dr. Fransico Him, Medical Director          Comments: Pt is making expected progress toward personal goals after completing 4 sessions. Recommend continued exercise and life style modification education including  stress management and relaxation techniques to decrease cardiac risk profile.

## 2015-11-05 ENCOUNTER — Encounter (HOSPITAL_COMMUNITY): Payer: PPO

## 2015-11-07 ENCOUNTER — Encounter (HOSPITAL_COMMUNITY)
Admission: RE | Admit: 2015-11-07 | Discharge: 2015-11-07 | Disposition: A | Discharge status: Home / Self Care | Payer: PPO | Source: Ambulatory Visit | Attending: Interventional Cardiology | Admitting: Interventional Cardiology

## 2015-11-07 DIAGNOSIS — Z951 Presence of aortocoronary bypass graft: Secondary | ICD-10-CM

## 2015-11-07 LAB — GLUCOSE, CAPILLARY
GLUCOSE-CAPILLARY: 133 mg/dL — AB (ref 65–99)
GLUCOSE-CAPILLARY: 92 mg/dL (ref 65–99)
Glucose-Capillary: 96 mg/dL (ref 65–99)

## 2015-11-07 NOTE — Progress Notes (Signed)
Incomplete Session Note  Patient Details  Name: [name] [name] MRN: [id number] Date of Birth: 10/22/1945 Referring Provider:        CARDIAC REHAB PHASE II ORIENTATION from 10/18/2015 in [place]   Referring Provider  Daneen Schick, MD      [name] did not complete his rehab session.  CBG 96. Mr [place] said he only ate an apple today prior to coming to exercise at cardiac rehab. Patient given lemonade and graham crackers. Repeat CBG 92. No exercise per protocol. Patient given a ginger ale. Repeat CBG 133. [place] said he will go eat a meal and will eat prior to coming to exercise on Friday. Barnet Pall, RN,BSN 11/07/2015 4:29 PM

## 2015-11-09 ENCOUNTER — Encounter (HOSPITAL_COMMUNITY)
Admission: RE | Admit: 2015-11-09 | Discharge: 2015-11-09 | Disposition: A | Discharge status: Home / Self Care | Payer: PPO | Source: Ambulatory Visit | Attending: Interventional Cardiology | Admitting: Interventional Cardiology

## 2015-11-09 DIAGNOSIS — Z951 Presence of aortocoronary bypass graft: Secondary | ICD-10-CM

## 2015-11-09 LAB — GLUCOSE, CAPILLARY: Glucose-Capillary: 116 mg/dL — ABNORMAL HIGH (ref 65–99)

## 2015-11-09 NOTE — Progress Notes (Addendum)
[name] [name]'s initial blood pressure was 160/80. [name] Corbello said he took his medications as prescribed. Recheck blood pressure 162/80 after walking the track for 10 minutes. [name] [name] did take his medications today as prescribed. [name] [name] did not exercise today due to elevated blood pressure. After sitting a few minutes [name] [name] recheck blood pressure was 138/80. [name] [name] takes his Avapro at night. Hal Meng PAC called and notified. No new orders received.Patient instructed to monitor his blood pressure at home and to watch his sodium intake.Barnet Pall, RN,BSN 11/09/2015 4:14 PM

## 2015-11-12 ENCOUNTER — Encounter (HOSPITAL_COMMUNITY): Payer: PPO

## 2015-11-12 MED FILL — METOPROLOL TARTRATE 25 MG T: 25 | 30 days supply | Qty: 90 | Fill #2

## 2015-11-12 MED FILL — IRBESARTAN 300 MG TABLET: 300 | 30 days supply | Qty: 30 | Fill #2

## 2015-11-13 MED FILL — WARFARIN SODIUM 2.5 MG TAB: 2.5 | 90 days supply | Qty: 90 | Fill #0

## 2015-11-14 ENCOUNTER — Encounter (HOSPITAL_COMMUNITY)
Admission: RE | Admit: 2015-11-14 | Discharge: 2015-11-14 | Disposition: A | Discharge status: Home / Self Care | Payer: PPO | Source: Ambulatory Visit | Attending: Interventional Cardiology | Admitting: Interventional Cardiology

## 2015-11-14 DIAGNOSIS — Z951 Presence of aortocoronary bypass graft: Secondary | ICD-10-CM | POA: Diagnosis not present

## 2015-11-14 LAB — GLUCOSE, CAPILLARY: Glucose-Capillary: 106 mg/dL — ABNORMAL HIGH (ref 65–99)

## 2015-11-15 ENCOUNTER — Ambulatory Visit (INDEPENDENT_AMBULATORY_CARE_PROVIDER_SITE_OTHER): Payer: PPO | Admitting: *Deleted

## 2015-11-15 DIAGNOSIS — I4891 Unspecified atrial fibrillation: Secondary | ICD-10-CM

## 2015-11-15 DIAGNOSIS — Z951 Presence of aortocoronary bypass graft: Secondary | ICD-10-CM

## 2015-11-15 DIAGNOSIS — Z5181 Encounter for therapeutic drug level monitoring: Secondary | ICD-10-CM

## 2015-11-15 LAB — POCT INR: INR: 3

## 2015-11-16 ENCOUNTER — Encounter (HOSPITAL_COMMUNITY)
Admission: RE | Admit: 2015-11-16 | Discharge: 2015-11-16 | Disposition: A | Discharge status: Home / Self Care | Payer: PPO | Source: Ambulatory Visit | Attending: Interventional Cardiology | Admitting: Interventional Cardiology

## 2015-11-16 DIAGNOSIS — Z951 Presence of aortocoronary bypass graft: Secondary | ICD-10-CM | POA: Diagnosis not present

## 2015-11-19 ENCOUNTER — Encounter (HOSPITAL_COMMUNITY): Payer: PPO

## 2015-11-21 ENCOUNTER — Encounter (HOSPITAL_COMMUNITY)
Admission: RE | Admit: 2015-11-21 | Discharge: 2015-11-21 | Disposition: A | Discharge status: Home / Self Care | Payer: PPO | Source: Ambulatory Visit | Attending: Interventional Cardiology | Admitting: Interventional Cardiology

## 2015-11-21 DIAGNOSIS — I10 Essential (primary) hypertension: Secondary | ICD-10-CM | POA: Diagnosis not present

## 2015-11-21 DIAGNOSIS — E785 Hyperlipidemia, unspecified: Secondary | ICD-10-CM | POA: Insufficient documentation

## 2015-11-21 DIAGNOSIS — Z951 Presence of aortocoronary bypass graft: Secondary | ICD-10-CM

## 2015-11-21 DIAGNOSIS — I2510 Atherosclerotic heart disease of native coronary artery without angina pectoris: Secondary | ICD-10-CM | POA: Insufficient documentation

## 2015-11-21 DIAGNOSIS — I480 Paroxysmal atrial fibrillation: Secondary | ICD-10-CM | POA: Insufficient documentation

## 2015-11-21 NOTE — Progress Notes (Signed)
[name] [name] 70 y.o. male Nutrition Note Spoke with pt. Nutrition Plan and Nutrition Survey goals reviewed with pt. Pt is following Step 2 of the Therapeutic Lifestyle Changes diet. Pt wants to lose wt but is not actively trying to lose wt at this time. Pt reports losing "some weight after surgery and I gained it back." Pt reports his UBW 185 lb.  Pt wt today 187.2 lb, which is up 5 lb since admission. Pt is diabetic. Last A1c indicates blood glucose not optimally controlled. Pt checks CBG's 2 times a day (before breakfast and 2 hr after dinner). Fasting CBG's reportedly 100-120 mg/dL. Pt is on Coumadin and is aware of the need to follow a diet with consistent vitamin K intake. Pt expressed understanding of the information reviewed. Pt aware of nutrition education classes offered.  Lab Results  Component Value Date   HGBA1C 7.1 (H) 08/10/2015   Wt Readings from Last 3 Encounters:  10/18/15 182 lb 12.2 oz (82.9 kg)  10/04/15 183 lb (83 kg)  09/27/15 182 lb (82.6 kg)   Nutrition Diagnosis ? Food-and nutrition-related knowledge deficit related to lack of exposure to information as related to diagnosis of: ? CVD ? DM ? Overweight related to excessive energy intake as evidenced by a BMI of 28.7  Nutrition Intervention ? Pt's individual nutrition plan reviewed with pt. ? Benefits of adopting Therapeutic Lifestyle Changes discussed when Medficts reviewed. ? Pt to attend the Portion Distortion class ? Pt to attend the Diabetes Q & A class ? Pt to attend the   ? Nutrition I class                  ? Nutrition II class     ? Diabetes Blitz class    ? Pt given handouts for: ? Nutrition I class ? Nutrition II class ? Diabetes Blitz Class ? Continue client-centered nutrition education by RD, as part of interdisciplinary care. Goal(s) ? CBG concentrations in the normal range or as close to normal as is safely possible. Monitor and Evaluate progress toward nutrition goal with team. Derek Mound,  M.Ed, RD, LDN, CDE 11/21/2015 2:16 PM

## 2015-11-22 ENCOUNTER — Telehealth: Payer: Self-pay | Admitting: Interventional Cardiology

## 2015-11-22 DIAGNOSIS — N401 Enlarged prostate with lower urinary tract symptoms: Secondary | ICD-10-CM | POA: Diagnosis not present

## 2015-11-22 DIAGNOSIS — R3912 Poor urinary stream: Secondary | ICD-10-CM | POA: Diagnosis not present

## 2015-11-22 NOTE — Telephone Encounter (Signed)
[name] Rowan, RN from [facility] called to report elevated BP. With machine, BP was 200/90 and on recheck 223/96. Manually, BP was 215/96. Heart rate maintains in the 60s. [name] [name] states the patient is asymptomatic. Instructed her to have the patient take his second dose of Metoprolol if he has it and to monitor. She stated that she didn't think the patient had his medicine and they will just call 911. Informed her Dr. Thompson Caul assistant will follow-up accordingly.

## 2015-11-23 ENCOUNTER — Encounter (HOSPITAL_COMMUNITY)
Admission: RE | Admit: 2015-11-23 | Discharge: 2015-11-23 | Disposition: A | Discharge status: Home / Self Care | Payer: PPO | Source: Ambulatory Visit | Attending: Interventional Cardiology | Admitting: Interventional Cardiology

## 2015-11-23 ENCOUNTER — Telehealth: Payer: Self-pay | Admitting: Cardiology

## 2015-11-23 ENCOUNTER — Other Ambulatory Visit: Payer: Self-pay | Admitting: *Deleted

## 2015-11-23 ENCOUNTER — Telehealth: Payer: Self-pay | Admitting: Interventional Cardiology

## 2015-11-23 DIAGNOSIS — Z951 Presence of aortocoronary bypass graft: Secondary | ICD-10-CM

## 2015-11-23 MED ORDER — HYDROCHLOROTHIAZIDE 12.5 MG PO CAPS: 12.5000 mg | ORAL_CAPSULE | Freq: Every day | ORAL | 6 refills | Status: DC

## 2015-11-23 MED FILL — HYDROCHLOROTHIAZIDE 12.5 MG: 12.5 | 30 days supply | Qty: 30 | Fill #0

## 2015-11-23 NOTE — Telephone Encounter (Signed)
Follow-up       Pt c/o BP issue: STAT if pt c/o blurred vision, one-sided weakness or slurred speech  1. What are your last 5 BP readings?8/3 bp 210/107, today 174/something  2. Are you having any other symptoms (ex. Dizziness, headache, blurred vision, passed out)? no  3. What is your BP issue? The pt was at the neurologyist office yesterday and EMS was called the pt choose to go and take b/p pills, and this morning the pt was aat cardiac rehab with elevated b/p the pt hasn't heard from anyone

## 2015-11-23 NOTE — Telephone Encounter (Signed)
Chart indicates pt is allergic to amlodipine. Reviewed with Cecilie Kicks, NP and pt should start HCTZ 12.5 mg by mouth daily. I spoke with pt and gave him these instructions and also instructed him to check BP over the weekend and call if systolic greater than 0000000.  Appt made for him to see Truitt Merle, NP on August 7,2017 at 2:00.  Prescription sent to [facility].

## 2015-11-23 NOTE — Progress Notes (Signed)
Pt arrived at cardiac rehab BP: 204/92 pre exercise.   Pt reports he washypertensive yesterday at Urology office visit.  BP:  205/90.  Office called EMS for evaluation. Phone call was also made to Dr. Thompson Caul office.  Pt went home, took evening avapro and metoprolol as directed.  Pt continued to monitor home BP with readings between 192/107-162/86.  Pt asymptomatic today however pt and wife are anxious about blood pressure readings.  Cecilie Kicks, NP notified.  Per Mickel Baas, pt instructed she will review chart and call pt at home with further recommendations.  Pt and wife verbalized understanding.

## 2015-11-23 NOTE — Telephone Encounter (Signed)
Rehab called, pt has been hypertensive 205/90 and 204.92 today with rehab, did come down to 174/90.  HR is 60 and is on upper dose of ARB.  Will add amlodipine 5 mg daily and ask him to come in for visit next week.   If he could check his BP over the weekend and call if > 0000000 systolic.  Thanks.

## 2015-11-23 NOTE — Telephone Encounter (Signed)
See previous phone note from today that addresses this.

## 2015-11-26 ENCOUNTER — Telehealth: Payer: Self-pay | Admitting: *Deleted

## 2015-11-26 ENCOUNTER — Ambulatory Visit (INDEPENDENT_AMBULATORY_CARE_PROVIDER_SITE_OTHER): Payer: PPO | Admitting: Nurse Practitioner

## 2015-11-26 ENCOUNTER — Encounter (HOSPITAL_COMMUNITY): Payer: PPO

## 2015-11-26 ENCOUNTER — Encounter (INDEPENDENT_AMBULATORY_CARE_PROVIDER_SITE_OTHER): Payer: Self-pay

## 2015-11-26 ENCOUNTER — Encounter: Payer: Self-pay | Admitting: Nurse Practitioner

## 2015-11-26 VITALS — BP 210/116 | HR 68 | Ht 67.0 in | Wt 185.0 lb

## 2015-11-26 DIAGNOSIS — I2510 Atherosclerotic heart disease of native coronary artery without angina pectoris: Secondary | ICD-10-CM | POA: Diagnosis not present

## 2015-11-26 DIAGNOSIS — I10 Essential (primary) hypertension: Secondary | ICD-10-CM | POA: Diagnosis not present

## 2015-11-26 MED ORDER — AMLODIPINE BESYLATE 10 MG PO TABS: 10.0000 mg | ORAL_TABLET | Freq: Every day | ORAL | 3 refills | Status: DC

## 2015-11-26 MED FILL — AMLODIPINE BESYLATE 10 MG T: 10 | 90 days supply | Qty: 90 | Fill #0

## 2015-11-26 NOTE — Telephone Encounter (Signed)
T/w pt's pharmacy( [facility]) to find out when pt started norvasc (november 26 20130  and last refilled Norvasc,  July 18, 2015.

## 2015-11-26 NOTE — Progress Notes (Signed)
CARDIOLOGY OFFICE NOTE  Date:  11/26/2015    [name] [name] Date of Birth: 1945-12-11 Medical Record [id number]  PCP:  Jonathon Bellows, MD  Cardiologist:  Tamala Julian (switched from [facility])   Chief Complaint  Patient presents with  . Hypertension    Work in visit - seen for Dr. Tamala Julian    History of Present Illness: [name] [name] is a 70 y.o. male who presents today for a work in visit. Seen for Dr. Tamala Julian (switched from Dr. Johnsie Cancel). Wife sees Dr. Tamala Julian but he has never seen Dr. Tamala Julian - typically followed by Richardson Dopp, PA.   He has a history of LBBB, HTN, HL, DM2, FHx CAD. He was evaluated for DOE and episodic L arm pain in 3/17.A Myoview was high risk and LHC demonstrated severe 3 vessel CAD. He underwent CABG with LIMA-LAD, SVG-PDA/PL 1, SVG-OM. Postoperative course was complicated by atrial fibrillation and was placed on amiodarone. He had recurrent episodes of atrial fibrillation and Coumadin was continued.   He was last seen by Richardson Dopp, PA back in June. Says he always sees Nicki Reaper and will see Dr. Tamala Julian later in September. Was doing ok. Amiodarone was stopped.   Called on Friday - BP was up - BP at urology office - according to the phone note - Norvasc was called in but he is ? allergic to this - so ended up getting just 1/2 dose of HCTZ started. BP has been high intermittently at rehab lately as well.   Comes back today. Here alone. BP up over the past couple of weeks. Does not really feel bad. Little occipital headache but "nothing bad". Says he does not normally have headaches - but phone note from May does talk about this - felt to NOT be related to his medicines.  No falls. No chest pain or short of breath. He tells me that Nicki Reaper stopped his Norvasc on June 26th - I see NO notation of this - apparently had a rash - which is still present.  He seems very confused about his medicines - it was his amiodarone that was stopped at Scott's last visit. We have no documentation of being  on Norvasc - which apparently has been on for many years.   We have called his pharmacy - looks like he has been on Norvasc since 2013 until March of 2017. Looks like PCP stopped it - due to rash - which is still present.   Past Medical History:  Diagnosis Date  . BPH (benign prostatic hyperplasia)    [facility]  . Coronary artery disease    a. Myoview 4/17: EF 47%, anteroseptal, apical anterior, apical septal, apical scar with peri-infarct ischemia, inferoseptal, inferior, inferolateral, apical inferior infarct with peri-infarct ischemia, high risk // b. LHC 4/17: Proximal RCA 99%, proximal LCx 100%, mid LAD 90%, distal LAD 60%, EF 55%  . Diabetes mellitus   . History of Doppler ultrasound    a. Carotid US 4/17:  bilateral ICA 1-39%  . History of echocardiogram    a. Echo 4/17:  EF 60-65%, normal wall motion, grade 1 diastolic dysfunction  . HLD (hyperlipidemia)   . Hypertension   . LBBB (left bundle branch block)   . Nephrolithiasis     Past Surgical History:  Procedure Laterality Date  . APPENDECTOMY  1976  . CARDIAC CATHETERIZATION N/A 08/03/2015   Procedure: Left Heart Cath and Coronary Angiography;  Surgeon: Belva Crome, MD;  Location: [facility];  Service: Cardiovascular;  Laterality: N/A;  . COLONOSCOPY    . CORONARY ARTERY BYPASS GRAFT N/A 08/13/2015   Procedure: CORONARY ARTERY BYPASS GRAFTING (CABG) x 4 using left internal mammary artery and right leg saphenous vein;  Surgeon: Gaye Pollack, MD;  Location: [facility];  Service: Open Heart Surgery;  Laterality: N/A;  . TEE WITHOUT CARDIOVERSION N/A 08/13/2015   Procedure: TRANSESOPHAGEAL ECHOCARDIOGRAM (TEE);  Surgeon: Gaye Pollack, MD;  Location: [facility];  Service: Open Heart Surgery;  Laterality: N/A;     Medications: Current Outpatient Prescriptions  Medication Sig Dispense Refill  . aspirin 81 MG tablet Take 81 mg by mouth daily.    . Cholecalciferol (VITAMIN D) 2000 units tablet Take 2,000 Units by mouth  3 (three) times a week.     . Cyanocobalamin (B-12) 2500 MCG TABS Take 1 tablet by mouth 3 (three) times a week.    . hydrochlorothiazide (MICROZIDE) 12.5 MG capsule Take 1 capsule (12.5 mg total) by mouth daily. 30 capsule 6  . irbesartan (AVAPRO) 300 MG tablet Take 300 mg by mouth daily.    . metFORMIN (GLUCOPHAGE-XR) 500 MG 24 hr tablet Take 1,000 mg by mouth daily.   11  . metoprolol tartrate (LOPRESSOR) 25 MG tablet Take 1.5 tablets (37.5 mg total) by mouth 2 (two) times daily. 270 tablet 3  . simvastatin (ZOCOR) 20 MG tablet Take 20 mg by mouth daily.    Marland Kitchen triamcinolone cream (KENALOG) 0.1 % Apply 1 application topically 2 (two) times daily as needed. Reported on 09/04/2015    . warfarin (COUMADIN) 2.5 MG tablet Take 1 tablet (2.5 mg total) by mouth daily. And as directed by the coumadin clinic 100 tablet 1   No current facility-administered medications for this visit.     Allergies: Allergies  Allergen Reactions  . Amlodipine Rash    Social History: The patient  reports that he has never smoked. He does not have any smokeless tobacco history on file. He reports that he does not drink alcohol or use drugs.   Family History: The patient's family history includes Heart attack (age of onset: 51) in his father; Heart disease in his father and mother; Heart failure (age of onset: 39) in his mother.   Review of Systems: Please see the history of present illness.   Otherwise, the review of systems is positive for none.   All other systems are reviewed and negative.   Physical Exam: VS:  BP (!) 210/116   Pulse 68   Ht 5\' 7"  (1.702 m)   Wt 185 lb (83.9 kg)   BMI 28.98 kg/m  .  BMI Body mass index is 28.98 kg/m.  Wt Readings from Last 3 Encounters:  11/26/15 185 lb (83.9 kg)  10/18/15 182 lb 12.2 oz (82.9 kg)  10/04/15 183 lb (83 kg)   BP recheck by me is 180/100  General: Pleasant. Very polite. He is alert and in no acute distress.   HEENT: Normal.  Neck: Supple, no JVD,  carotid bruits, or masses noted.  Cardiac: Regular rate and rhythm. No murmurs, rubs, or gallops. No edema. Looks like early brawny stasis changes on his legs.   Respiratory:  Lungs are clear to auscultation bilaterally with normal work of breathing.  GI: Soft and nontender.  MS: No deformity or atrophy. Gait and ROM intact.  Skin: Warm and dry. Color is normal.  Neuro:  Strength and sensation are intact and no gross focal deficits noted.  Psych: Alert, appropriate and  with normal affect.   LABORATORY DATA:  EKG:  EKG is not ordered today.  Lab Results  Component Value Date   WBC 8.2 08/16/2015   HGB 10.2 (L) 08/16/2015   HCT 30.1 (L) 08/16/2015   PLT 127 (L) 08/16/2015   GLUCOSE 122 (H) 08/18/2015   ALT 37 08/10/2015   AST 30 08/10/2015   NA 139 08/18/2015   K 4.1 08/18/2015   CL 104 08/18/2015   CREATININE 1.14 08/18/2015   BUN 17 08/18/2015   CO2 24 08/18/2015   INR 3.0 11/15/2015   HGBA1C 7.1 (H) 08/10/2015    BNP (last 3 results) No results for input(s): BNP in the last 8760 hours.  ProBNP (last 3 results) No results for input(s): PROBNP in the last 8760 hours.   Other Studies Reviewed Today: Carotid US 4/17 Bilat ICA 1-39%  LHC 4/17 1. Prox RCA lesion, 99% stenosed. 2. Prox Cx to Mid Cx lesion, 100% stenosed. 3. Mid LAD lesion, 90% stenosed. 4. Dist LAD lesion, 60% stenosed. Severe three-vessel coronary disease in 70 year old diabetic patient with 90% proximal LAD arising after the second diagonal but before the first several perforated, total occlusion of the proximal circumflex, and high grade obstruction in the proximal RCA (dominant vessel) Overall normal LV function with EF 55%  Echo 4/17 EF 60-65%, normal wall motion, grade 1 diastolic dysfunction  Myoview 4/17 Nuclear stress EF: 47%. There was no ST segment deviation noted during stress. There is a large defect of severe severity present in the basal anteroseptal, mid anteroseptal, apical  anterior, apical septal and apex location. The defect is partially reversible. This is consistent with scar with peri infarct ischemia. There is a large defect of severe severity present in the basal inferoseptal, basal inferior, basal inferolateral, mid inferoseptal, mid inferior, mid inferolateral and apical inferior location. The defect is partially reversible and consistent with infarct with peri infarct ischemia. This is a high risk study. The left ventricular ejection fraction is mildly decreased (45-54%).  Echo 10/13 ([place]) Mild LVH, proximal septal thickening, EF > 55%, impaired relaxation, mild to mod LAE, mild MAC, mild MR, mild TR, RVSP 30-63mmHg,    Assessment/Plan:  1. HTN - uncontrolled - not really clear about the Norvasc - he now feels like that he is not allergic - and still with ?rash - so very agreeable to restarting this today. Will hold the Microzide. See Nicki Reaper later this week and to bring his cuff with him to check for correlation. Has follow up with Dr. Tamala Julian in September.   2. CAD - s/p CABG. He is continuing to do very well.  He denies anginal symptoms. Continue ASA, statin, beta-blocker. In cardiac rehab but limited due to elevated BP  3. PAF - Post op AFib. He remains in NSR.  Remains off amiodarone.  Continue Coumadin. FU with the CVRR clinic.  Per Nicki Reaper - At FU in 3 mos, if he remains in NSR, consider DC Coumadin.     4. HL - Continue Simvastatin. This is managed by PCP. LDL goal is < 70.  Given current ACC guidelines, consider changing to mod to high intensity statin (Lipitor 40 vs Lipitor 80 or Crestor 20 vs Crestor 40).  Will defer to PCP.  Current medicines are reviewed with the patient today.  The patient does not have concerns regarding medicines other than what has been noted above.  The following changes have been made:  See above.  Labs/ tests ordered today include:   No orders  of the defined types were placed in this  encounter.    Disposition:   FU with Scott on Friday.   Patient is agreeable to this plan and will call if any problems develop in the interim.   Signed: Burtis Junes, RN, ANP-C 11/26/2015 2:21 PM  [facility] [address] [place], [place]  [address] Phone: [phone] Fax: [phone]

## 2015-11-26 NOTE — Patient Instructions (Addendum)
We will be checking the following labs today - NONE   Medication Instructions:    Continue with your current medicines. BUT  Do not take the Microzide for now  Restart Norvasc (Amlodipine) 10 mg daily - take first dose as soon as you pick up.     Testing/Procedures To Be Arranged:  N/A  Follow-Up:   See Nicki Reaper next week and bring your BP cuff with you.     Other Special Instructions:   N/A    If you need a refill on your cardiac medications before your next appointment, please call your pharmacy.   Call the [place] office at [phone] if you have any questions, problems or concerns.

## 2015-11-27 MED FILL — METFORMIN HCL ER 500 MG TAB: 500 | 30 days supply | Qty: 60 | Fill #5

## 2015-11-28 ENCOUNTER — Encounter (HOSPITAL_COMMUNITY): Payer: Self-pay

## 2015-11-28 ENCOUNTER — Encounter (HOSPITAL_COMMUNITY)
Admission: RE | Admit: 2015-11-28 | Discharge: 2015-11-28 | Disposition: A | Discharge status: Home / Self Care | Payer: PPO | Source: Ambulatory Visit | Attending: Interventional Cardiology | Admitting: Interventional Cardiology

## 2015-11-28 DIAGNOSIS — Z951 Presence of aortocoronary bypass graft: Secondary | ICD-10-CM | POA: Diagnosis not present

## 2015-11-28 NOTE — Progress Notes (Signed)
Pt arrived at cardiac rehab with BP:  134/84.  Pt brought home BP cuff for comparison.  Left arm manual BP: 130/74.  Automated home cuff BP:  190/92, left arm. Recheck manual BP: 150/74.  Pt exercised without difficulty with appropriate BP response to exercise. Pt POST BP:  144/70 manual, 157/84 automated home cuff.  Pt advised home cuff is reading high.  Pt also instructed to check home BP 1-2 times daily and avoid frequent checks. Pt verbalized understanding.  Pt reports he started amlodipine.  Med list reconciled.

## 2015-11-29 ENCOUNTER — Encounter: Payer: Self-pay | Admitting: Physician Assistant

## 2015-11-29 MED FILL — SIMVASTATIN 20 MG TABLET: 20 | 90 days supply | Qty: 90 | Fill #2

## 2015-11-29 NOTE — Progress Notes (Signed)
Cardiology Office Note:    Date:  11/30/2015   ID:  [name], [name] Sep 17, 1945, MRN [id number]  PCP:  Jonathon Bellows, MD  Cardiologist:  Dr. Daneen Schick / Richardson Dopp, PA-C  Electrophysiologist:  n/a  Referring MD: Maurice Small, MD   Chief Complaint  Patient presents with  . Follow-up    HTN    History of Present Illness:    [name] [name] is a 70 y.o. male with a hx of CAD s/p CABG, LBBB, HTN, HL, DM2, FHx CAD. He was evaluated for DOE and episodic L arm pain in 3/17.A Myoview was high risk and LHC demonstrated severe 3 vessel CAD. He underwent CABG with LIMA-LAD, SVG-PDA/PL 1, SVG-OM. Postoperative course was complicated by atrial fibrillation and was placed on amiodarone. He had recurrent episodes of atrial fibrillation and was placed on Coumadin.  His Amiodarone was DC'd in 6/17.  Plan is to consider stopping Coumadin once he is off of Amiodarone for 3 mos and maintaining NSR.    Last seen by Truitt Merle, NP 11/26/15.  BP was noted to be high at Urology office.  There was a question of whether or not Amlodipine was DC'd.  It turns out, his PCP DC'd it for a rash in 3/17.  But, he still has the rash.  Cecille Rubin put him back on Amlodipine 10 mg QD.  He returns for FU on BP.    He is here today with his wife.  He is doing well.  He brings in his BP cuff.  Readings over the past couple of days are optimal.  He does tend to have higher BPs in the MD office.  He denies chest pain, dyspnea, syncope, orthopnea, PND, edema.  He had a HA but this is resolved.  He does note that the only thing that helped his rash was stopping the Amlodipine.  He had fairly intense pruritis before stopping it.  The pruritis resolved.  He has not noted any change since resuming Amlodipine earlier this week.   Prior CV studies that were reviewed today include:    Carotid US 4/17 Bilat ICA 1-39%  LHC 4/17 Prox RCA lesion, 99% stenosed. Prox Cx to Mid Cx lesion, 100% stenosed. Mid LAD lesion, 90%  stenosed. Dist LAD lesion, 60% stenosed. Severe three-vessel coronary disease in 70 year old diabetic patient with 90% proximal LAD arising after the second diagonal but before the first several perforated, total occlusion of the proximal circumflex, and high grade obstruction in the proximal RCA (dominant vessel) Overall normal LV function with EF 55%  Echo 4/17 EF 60-65%, normal wall motion, grade 1 diastolic dysfunction  Myoview 4/17 Nuclear stress EF: 47%. There was no ST segment deviation noted during stress. There is a large defect of severe severity present in the basal anteroseptal, mid anteroseptal, apical anterior, apical septal and apex location. The defect is partially reversible. This is consistent with scar with peri infarct ischemia. There is a large defect of severe severity present in the basal inferoseptal, basal inferior, basal inferolateral, mid inferoseptal, mid inferior, mid inferolateral and apical inferior location. The defect is partially reversible and consistent with infarct with peri infarct ischemia. This is a high risk study. The left ventricular ejection fraction is mildly decreased (45-54%).  Echo 10/13 ([place]) Mild LVH, proximal septal thickening, EF > 55%, impaired relaxation, mild to mod LAE, mild MAC, mild MR, mild TR, RVSP 30-91mmHg,   Past Medical History:  Diagnosis Date  . BPH (benign prostatic hyperplasia)  [facility]  . Coronary artery disease    a. Myoview 4/17: EF 47%, anteroseptal, apical anterior, apical septal, apical scar with peri-infarct ischemia, inferoseptal, inferior, inferolateral, apical inferior infarct with peri-infarct ischemia, high risk // b. LHC 4/17: Proximal RCA 99%, proximal LCx 100%, mid LAD 90%, distal LAD 60%, EF 55%  . Diabetes mellitus   . History of Doppler ultrasound    a. Carotid US 4/17:  bilateral ICA 1-39%  . History of echocardiogram    a. Echo 4/17:  EF 60-65%, normal wall motion, grade 1 diastolic  dysfunction  . HLD (hyperlipidemia)   . Hypertension   . LBBB (left bundle branch block)   . Nephrolithiasis     Past Surgical History:  Procedure Laterality Date  . APPENDECTOMY  1976  . CARDIAC CATHETERIZATION N/A 08/03/2015   Procedure: Left Heart Cath and Coronary Angiography;  Surgeon: Belva Crome, MD;  Location: [facility];  Service: Cardiovascular;  Laterality: N/A;  . COLONOSCOPY    . CORONARY ARTERY BYPASS GRAFT N/A 08/13/2015   Procedure: CORONARY ARTERY BYPASS GRAFTING (CABG) x 4 using left internal mammary artery and right leg saphenous vein;  Surgeon: Gaye Pollack, MD;  Location: [facility];  Service: Open Heart Surgery;  Laterality: N/A;  . TEE WITHOUT CARDIOVERSION N/A 08/13/2015   Procedure: TRANSESOPHAGEAL ECHOCARDIOGRAM (TEE);  Surgeon: Gaye Pollack, MD;  Location: [facility];  Service: Open Heart Surgery;  Laterality: N/A;    Current Medications: Outpatient Medications Prior to Visit  Medication Sig Dispense Refill  . amLODipine (NORVASC) 10 MG tablet Take 1 tablet (10 mg total) by mouth daily. 90 tablet 3  . aspirin 81 MG tablet Take 81 mg by mouth daily.    . Cholecalciferol (VITAMIN D) 2000 units tablet Take 2,000 Units by mouth 3 (three) times a week.     . Cyanocobalamin (B-12) 2500 MCG TABS Take 1 tablet by mouth 3 (three) times a week.    . irbesartan (AVAPRO) 300 MG tablet Take 300 mg by mouth daily.    . metFORMIN (GLUCOPHAGE-XR) 500 MG 24 hr tablet Take 1,000 mg by mouth daily.   11  . metoprolol tartrate (LOPRESSOR) 25 MG tablet Take 1.5 tablets (37.5 mg total) by mouth 2 (two) times daily. 270 tablet 3  . simvastatin (ZOCOR) 20 MG tablet Take 20 mg by mouth daily.    Marland Kitchen triamcinolone cream (KENALOG) 0.1 % Apply 1 application topically 2 (two) times daily as needed. Reported on 09/04/2015    . warfarin (COUMADIN) 2.5 MG tablet Take 1 tablet (2.5 mg total) by mouth daily. And as directed by the coumadin clinic 100 tablet 1   No facility-administered  medications prior to visit.       Allergies:   Review of patient's allergies indicates no active allergies.   Social History   Social History  . Marital status: Married    Spouse name: N/A  . Number of children: N/A  . Years of education: N/A   Social History Main Topics  . Smoking status: Never Smoker  . Smokeless tobacco: Never Used  . Alcohol use No  . Drug use: No  . Sexual activity: Not Asked   Other Topics Concern  . None   Social History Narrative   Retired   Married; 1 Licensed [employer] x 30 years   Originally from [place] History:  The patient's family history includes Heart attack (age of onset: 56)  in his father; Heart disease in his father and mother; Heart failure (age of onset: 61) in his mother.   ROS:   Please see the history of present illness.    Review of Systems  Skin: Positive for rash.   All other systems reviewed and are negative.   EKGs/Labs/Other Test Reviewed:    EKG:  EKG is  ordered today.  The ekg ordered today demonstrates Sinus Brady, HR 56, LBBB, no changes  Recent Labs: 08/10/2015: ALT 37 08/14/2015: Magnesium 2.0 08/16/2015: Hemoglobin 10.2; Platelets 127 08/18/2015: BUN 17; Creatinine, Ser 1.14; Potassium 4.1; Sodium 139   Recent Lipid Panel No results found for: CHOL, TRIG, HDL, CHOLHDL, VLDL, LDLCALC, LDLDIRECT   Physical Exam:    VS:  BP (!) 160/70   Pulse (!) 56   Ht 5\' 7"  (1.702 m)   Wt 186 lb 12.8 oz (84.7 kg)   BMI 29.26 kg/m     Wt Readings from Last 3 Encounters:  11/30/15 186 lb 12.8 oz (84.7 kg)  11/26/15 185 lb (83.9 kg)  10/18/15 182 lb 12.2 oz (82.9 kg)     Physical Exam  Constitutional: He is oriented to person, place, and time. He appears well-developed and well-nourished. No distress.  HENT:  Head: Normocephalic and atraumatic.  Eyes: No scleral icterus.  Neck: Normal range of motion. No JVD present.  Cardiovascular: Normal rate, regular rhythm, S1 normal and  S2 normal.  Exam reveals no gallop and no friction rub.   No murmur heard. Pulmonary/Chest: Effort normal and breath sounds normal. He has no wheezes. He has no rhonchi. He has no rales.  Abdominal: Soft. There is no tenderness.  Musculoskeletal: He exhibits no edema.  Neurological: He is alert and oriented to person, place, and time.  Skin: Skin is warm and dry.  Psychiatric: He has a normal mood and affect.    ASSESSMENT:    1. HTN (hypertension), malignant   2. Coronary artery disease involving native coronary artery of native heart without angina pectoris   3. Paroxysmal atrial fibrillation (HCC)   4. Hyperlipidemia    PLAN:    In order of problems listed above:  1. HTN - BP by me today is 152/80.  His BP machine read 161/87.  His BP readings at home have been optimal the last few days.  I have asked him to keep a record of his BP over the next 2 weeks and send them to me.  -  If BP remains above target, consider changing Metoprolol to Carvedilol or Avapro to Avalide  -  If pruritic rash returns, will need to DC Amlodipine and make above changes +/- add'n of Hydralazine.   2. CAD - s/p CABG.  No angina.  Continue cardiac rehab.  Continue ASA, statin, beta-blocker.     3. PAF - Post op AFib.  He is maintaining NSR off of Amiodarone.  Consider DC Coumadin at FU in 12/2015.    4. HL - Continue Simvastatin.  Dose of 20 mg is appropriate with Amlodipine. Lipids managed by PCP.    Medication Adjustments/Labs and Tests Ordered: Current medicines are reviewed at length with the patient today.  Concerns regarding medicines are outlined above.  Medication changes, Labs and Tests ordered today are outlined in the Patient Instructions noted below. Patient Instructions  Medication Instructions:  Continue your current medications. If you get the rash and itchiness back, let me know.  I can change your medications at that point. Labwork: None  Testing/Procedures: None  Follow-Up: Dr.  Daneen Schick as planned in 12/2015. Any Other Special Instructions Will Be Listed Below (If Applicable). Check your blood pressure once a day for 2 weeks and send me the readings.  You can send an electronic message through MyChart if you want. If you need a refill on your cardiac medications before your next appointment, please call your pharmacy.   Signed, Richardson Dopp, PA-C  11/30/2015 12:26 PM    [facility] [place], [place], [place]  [address] Phone: [phone]; Fax: [phone]

## 2015-11-30 ENCOUNTER — Ambulatory Visit (INDEPENDENT_AMBULATORY_CARE_PROVIDER_SITE_OTHER): Payer: PPO | Admitting: Physician Assistant

## 2015-11-30 ENCOUNTER — Encounter: Payer: Self-pay | Admitting: Physician Assistant

## 2015-11-30 ENCOUNTER — Encounter (HOSPITAL_COMMUNITY)
Admission: RE | Admit: 2015-11-30 | Discharge: 2015-11-30 | Disposition: A | Discharge status: Home / Self Care | Payer: PPO | Source: Ambulatory Visit | Attending: Interventional Cardiology | Admitting: Interventional Cardiology

## 2015-11-30 VITALS — BP 160/70 | HR 56 | Ht 67.0 in | Wt 186.8 lb

## 2015-11-30 DIAGNOSIS — I480 Paroxysmal atrial fibrillation: Secondary | ICD-10-CM | POA: Diagnosis not present

## 2015-11-30 DIAGNOSIS — I2510 Atherosclerotic heart disease of native coronary artery without angina pectoris: Secondary | ICD-10-CM | POA: Diagnosis not present

## 2015-11-30 DIAGNOSIS — E785 Hyperlipidemia, unspecified: Secondary | ICD-10-CM | POA: Diagnosis not present

## 2015-11-30 DIAGNOSIS — Z951 Presence of aortocoronary bypass graft: Secondary | ICD-10-CM | POA: Diagnosis not present

## 2015-11-30 DIAGNOSIS — I10 Essential (primary) hypertension: Secondary | ICD-10-CM

## 2015-11-30 NOTE — Patient Instructions (Addendum)
Medication Instructions:  Continue your current medications. If you get the rash and itchiness back, let me know.  I can change your medications at that point. Labwork: None  Testing/Procedures: None  Follow-Up: Dr. Daneen Schick as planned in 12/2015. Any Other Special Instructions Will Be Listed Below (If Applicable). Check your blood pressure once a day for 2 weeks and send me the readings.  You can send an electronic message through MyChart if you want. If you need a refill on your cardiac medications before your next appointment, please call your pharmacy.

## 2015-12-03 ENCOUNTER — Encounter (HOSPITAL_COMMUNITY): Payer: PPO

## 2015-12-05 ENCOUNTER — Encounter (HOSPITAL_COMMUNITY)
Admission: RE | Admit: 2015-12-05 | Discharge: 2015-12-05 | Disposition: A | Discharge status: Home / Self Care | Payer: PPO | Source: Ambulatory Visit | Attending: Interventional Cardiology | Admitting: Interventional Cardiology

## 2015-12-05 DIAGNOSIS — Z951 Presence of aortocoronary bypass graft: Secondary | ICD-10-CM

## 2015-12-06 ENCOUNTER — Ambulatory Visit (INDEPENDENT_AMBULATORY_CARE_PROVIDER_SITE_OTHER): Payer: PPO

## 2015-12-06 DIAGNOSIS — I4891 Unspecified atrial fibrillation: Secondary | ICD-10-CM | POA: Diagnosis not present

## 2015-12-06 DIAGNOSIS — Z951 Presence of aortocoronary bypass graft: Secondary | ICD-10-CM

## 2015-12-06 DIAGNOSIS — Z5181 Encounter for therapeutic drug level monitoring: Secondary | ICD-10-CM

## 2015-12-06 LAB — POCT INR: INR: 2.7

## 2015-12-07 ENCOUNTER — Encounter (HOSPITAL_COMMUNITY)
Admission: RE | Admit: 2015-12-07 | Discharge: 2015-12-07 | Disposition: A | Discharge status: Home / Self Care | Payer: PPO | Source: Ambulatory Visit | Attending: Interventional Cardiology | Admitting: Interventional Cardiology

## 2015-12-07 DIAGNOSIS — Z951 Presence of aortocoronary bypass graft: Secondary | ICD-10-CM

## 2015-12-07 NOTE — Progress Notes (Signed)
Cardiac Individual Treatment Plan  Patient Details  Name: [name] [name] MRN: [id number] Date of Birth: 06-Dec-1945 Referring Provider:   Flowsheet Row CARDIAC REHAB PHASE II ORIENTATION from 10/18/2015 in [place]  Referring Provider  Daneen Schick, MD      Initial Encounter Date:  [place] PHASE II ORIENTATION from 10/18/2015 in [place]  Date  10/18/15  Referring Provider  Daneen Schick, MD      Visit Diagnosis: No diagnosis found.  Patient's Home Medications on Admission:  Current Outpatient Prescriptions:  .  amLODipine (NORVASC) 10 MG tablet, Take 1 tablet (10 mg total) by mouth daily., Disp: 90 tablet, Rfl: 3 .  aspirin 81 MG tablet, Take 81 mg by mouth daily., Disp: , Rfl:  .  Cholecalciferol (VITAMIN D) 2000 units tablet, Take 2,000 Units by mouth 3 (three) times a week. , Disp: , Rfl:  .  Cyanocobalamin (B-12) 2500 MCG TABS, Take 1 tablet by mouth 3 (three) times a week., Disp: , Rfl:  .  irbesartan (AVAPRO) 300 MG tablet, Take 300 mg by mouth daily., Disp: , Rfl:  .  metFORMIN (GLUCOPHAGE-XR) 500 MG 24 hr tablet, Take 1,000 mg by mouth daily. , Disp: , Rfl: 11 .  metoprolol succinate (TOPROL-XL) 25 MG 24 hr tablet, Take 1.5 tablets daily, Disp: , Rfl:  .  metoprolol tartrate (LOPRESSOR) 25 MG tablet, Take 1.5 tablets (37.5 mg total) by mouth 2 (two) times daily., Disp: 270 tablet, Rfl: 3 .  simvastatin (ZOCOR) 20 MG tablet, Take 20 mg by mouth daily., Disp: , Rfl:  .  triamcinolone cream (KENALOG) 0.1 %, Apply 1 application topically 2 (two) times daily as needed. Reported on 09/04/2015, Disp: , Rfl:  .  warfarin (COUMADIN) 2.5 MG tablet, Take 1 tablet (2.5 mg total) by mouth daily. And as directed by the coumadin clinic, Disp: 100 tablet, Rfl: 1  Past Medical History: Past Medical History:  Diagnosis Date  . BPH (benign prostatic hyperplasia)    [facility]  . Coronary artery  disease    a. Myoview 4/17: EF 47%, anteroseptal, apical anterior, apical septal, apical scar with peri-infarct ischemia, inferoseptal, inferior, inferolateral, apical inferior infarct with peri-infarct ischemia, high risk // b. LHC 4/17: Proximal RCA 99%, proximal LCx 100%, mid LAD 90%, distal LAD 60%, EF 55%  . Diabetes mellitus   . History of Doppler ultrasound    a. Carotid US 4/17:  bilateral ICA 1-39%  . History of echocardiogram    a. Echo 4/17:  EF 60-65%, normal wall motion, grade 1 diastolic dysfunction  . HLD (hyperlipidemia)   . Hypertension   . LBBB (left bundle branch block)   . Nephrolithiasis     Tobacco Use: History  Smoking Status  . Never Smoker  Smokeless Tobacco  . Never Used    Labs: Recent Review Flowsheet Data    Labs for ITP Cardiac and Pulmonary Rehab Latest Ref Rng & Units 08/13/2015 08/13/2015 08/13/2015 08/13/2015 08/13/2015   Hemoglobin A1c 4.8 - 5.6 % - - - - -   PHART 7.350 - 7.450 7.366 7.341(L) - 7.446 7.353   PCO2ART 35.0 - 45.0 mmHg 39.0 44.6 - 32.3(L) 41.2   HCO3 20.0 - 24.0 mEq/L 22.7 23.8 - 22.1 22.8   TCO2 0 - 100 mmol/L 24 25 22 23 24    ACIDBASEDEF 0.0 - 2.0 mmol/L 3.0(H) 2.0 - 1.0 2.0   O2SAT % 92.0 90.0 - 96.0 96.0  Capillary Blood Glucose: Lab Results  Component Value Date   GLUCAP 106 (H) 11/14/2015   GLUCAP 116 (H) 11/09/2015   GLUCAP 133 (H) 11/07/2015   GLUCAP 92 11/07/2015   GLUCAP 96 11/07/2015     Exercise Target Goals:    Exercise Program Goal: Individual exercise prescription set with THRR, safety & activity barriers. Participant demonstrates ability to understand and report RPE using BORG scale, to self-measure pulse accurately, and to acknowledge the importance of the exercise prescription.  Exercise Prescription Goal: Starting with aerobic activity 30 plus minutes a day, 3 days per week for initial exercise prescription. Provide home exercise prescription and guidelines that participant acknowledges  understanding prior to discharge.  Activity Barriers & Risk Stratification:     Activity Barriers & Cardiac Risk Stratification - 10/18/15 0906      Activity Barriers & Cardiac Risk Stratification   Activity Barriers None   Cardiac Risk Stratification High      6 Minute Walk:     6 Minute Walk    Row Name 10/18/15 1533         6 Minute Walk   Phase Initial     Distance 1400 feet     Walk Time 6 minutes     # of Rest Breaks 0     MPH 2.65     METS 3     RPE 11     VO2 Peak 10.6     Symptoms No     Resting HR 62 bpm     Resting BP 140/88     Max Ex. HR 76 bpm     Max Ex. BP 164/80     2 Minute Post BP 144/84        Initial Exercise Prescription:     Initial Exercise Prescription - 10/18/15 1500      Date of Initial Exercise RX and Referring Provider   Date 10/18/15   Referring Provider Daneen Schick, MD     Bike   Level 0.5   Minutes 10   METs 2     NuStep   Level 3   Minutes 10   METs 2     Track   Laps 10   Minutes 10   METs 2.74     Prescription Details   Frequency (times per week) 2   Duration Progress to 30 minutes of continuous aerobic without signs/symptoms of physical distress     Intensity   THRR 40-80% of Max Heartrate 60-121   Ratings of Perceived Exertion 11-13   Perceived Dyspnea 0-4     Progression   Progression Continue to progress workloads to maintain intensity without signs/symptoms of physical distress.     Resistance Training   Training Prescription Yes   Weight 2lbs   Reps 10-12      Perform Capillary Blood Glucose checks as needed.  Exercise Prescription Changes:     Exercise Prescription Changes    Row Name 11/01/15 1700 11/16/15 1200 11/28/15 1600         Exercise Review   Progression Yes Yes Yes       Response to Exercise   Blood Pressure (Admit) 130/80 148/90 146/84     Blood Pressure (Exercise) 154/76 160/80 150/82     Blood Pressure (Exit) 130/80 134/82 132/90     Heart Rate (Admit) 62 bpm 72  bpm 53 bpm     Heart Rate (Exercise) 76 bpm 85 bpm 74 bpm     Heart Rate (  Exit) 56 bpm 71 bpm 52 bpm     Rating of Perceived Exertion (Exercise) 11 12 11      Duration Progress to 30 minutes of continuous aerobic without signs/symptoms of physical distress Progress to 30 minutes of continuous aerobic without signs/symptoms of physical distress Progress to 30 minutes of continuous aerobic without signs/symptoms of physical distress     Intensity THRR unchanged THRR unchanged THRR unchanged       Progression   Progression Continue to progress workloads to maintain intensity without signs/symptoms of physical distress. Continue to progress workloads to maintain intensity without signs/symptoms of physical distress. Continue to progress workloads to maintain intensity without signs/symptoms of physical distress.     Average METs 2.9 2.9 3.2       Resistance Training   Training Prescription Yes Yes Yes     Weight 1lb 5lbs 5lbs     Reps 10-12 10-12 10-12       Bike   Level 0.8 0.8 0.8     Minutes 10 10 10      METs 2.81 2.77 2.77       NuStep   Level 3 3 3      Minutes 10 10 10      METs 4.1 2.4 3.1       Track   Laps 13 9 13      Minutes 10 10 10      METs 3.26 2.57 3.26       Home Exercise Plan   Plans to continue exercise at  - Home  reviewed on 10/31/15 see progress note Home  reviewed on 10/31/15 see progress note     Frequency  - Add 2 additional days to program exercise sessions. Add 2 additional days to program exercise sessions.        Exercise Comments:     Exercise Comments    Row Name 11/01/15 1702 11/30/15 1223         Exercise Comments Reviewed METs and activity levels with pt. Pt is tolerating exercise well; will continue to monitor exercise progression Reviewed METs and activity levels with pt. Pt is tolerating exercise well; will continue to monitor exercise progression         Discharge Exercise Prescription (Final Exercise Prescription Changes):     Exercise  Prescription Changes - 11/28/15 1600      Exercise Review   Progression Yes     Response to Exercise   Blood Pressure (Admit) 146/84   Blood Pressure (Exercise) 150/82   Blood Pressure (Exit) 132/90   Heart Rate (Admit) 53 bpm   Heart Rate (Exercise) 74 bpm   Heart Rate (Exit) 52 bpm   Rating of Perceived Exertion (Exercise) 11   Duration Progress to 30 minutes of continuous aerobic without signs/symptoms of physical distress   Intensity THRR unchanged     Progression   Progression Continue to progress workloads to maintain intensity without signs/symptoms of physical distress.   Average METs 3.2     Resistance Training   Training Prescription Yes   Weight 5lbs   Reps 10-12     Bike   Level 0.8   Minutes 10   METs 2.77     NuStep   Level 3   Minutes 10   METs 3.1     Track   Laps 13   Minutes 10   METs 3.26     Home Exercise Plan   Plans to continue exercise at Home  reviewed on 10/31/15 see progress note  Frequency Add 2 additional days to program exercise sessions.      Nutrition:  Target Goals: Understanding of nutrition guidelines, daily intake of sodium 1500mg , cholesterol 200mg , calories 30% from fat and 7% or less from saturated fats, daily to have 5 or more servings of fruits and vegetables.  Biometrics:     Pre Biometrics - 10/18/15 1532      Pre Biometrics   Waist Circumference 41.5 inches   Hip Circumference 42 inches   Waist to Hip Ratio 0.99 %   Triceps Skinfold 14 mm   % Body Fat 28.5 %   Grip Strength 44 kg   Flexibility 11 in   Single Leg Stand 19 seconds       Nutrition Therapy Plan and Nutrition Goals:   Nutrition Discharge: Nutrition Scores:     Nutrition Assessments - 11/21/15 1415      MEDFICTS Scores   Pre Score 33      Nutrition Goals Re-Evaluation:   Psychosocial: Target Goals: Acknowledge presence or absence of depression, maximize coping skills, provide positive support system. Participant is able to  verbalize types and ability to use techniques and skills needed for reducing stress and depression.  Initial Review & Psychosocial Screening:     Initial Psych Review & Screening - 10/24/15 [place]? Yes     Barriers   Psychosocial barriers to participate in program The patient should benefit from training in stress management and relaxation.;There are no identifiable barriers or psychosocial needs.     Screening Interventions   Interventions Encouraged to exercise      Quality of Life Scores:     Quality of Life - 10/18/15 1338      Quality of Life Scores   Health/Function Pre 24.04 %   Socioeconomic Pre 25 %   Psych/Spiritual Pre 22.5 %   Family Pre 27 %   GLOBAL Pre 24.38 %      PHQ-9: Recent Review Flowsheet Data    Depression screen [facility] 2/9 10/24/2015 08/30/2015   Decreased Interest 0 0   Down, Depressed, Hopeless 0 0   PHQ - 2 Score 0 0      Psychosocial Evaluation and Intervention:     Psychosocial Evaluation - 12/07/15 0809      Psychosocial Evaluation & Interventions   Interventions Stress management education;Relaxation education;Encouraged to exercise with the program and follow exercise prescription   Comments pt has health related anxiety due to recent hypertension.  BP is improving with  medication adjustments, which is easing pt anxiety.    Continued Psychosocial Services Needed Yes      Psychosocial Re-Evaluation:     Psychosocial Re-Evaluation    [place] Name 11/02/15 1447             Psychosocial Re-Evaluation   Interventions Stress management education;Relaxation education;Encouraged to attend Cardiac Rehabilitation for the exercise       Comments pt        Continued Psychosocial Services Needed Yes          Vocational Rehabilitation: Provide vocational rehab assistance to qualifying candidates.   Vocational Rehab Evaluation & Intervention:     Vocational Rehab - 10/19/15 1440      Initial  Vocational Rehab Evaluation & Intervention   Assessment shows need for Vocational Rehabilitation No      Education: Education Goals: Education classes will be provided on a weekly basis, covering required topics. Participant will state understanding/return  demonstration of topics presented.  Learning Barriers/Preferences:     Learning Barriers/Preferences - 10/19/15 1441      Learning Barriers/Preferences   Learning Barriers Sight  reading glasses      Education Topics: Count Your Pulse:  -Group instruction provided by verbal instruction, demonstration, patient participation and written materials to support subject.  Instructors address importance of being able to find your pulse and how to count your pulse when at home without a heart monitor.  Patients get hands on experience counting their pulse with staff help and individually. Flowsheet Row CARDIAC REHAB PHASE II EXERCISE from 12/05/2015 in [place]  Date  10/26/15  Instruction Review Code  2- meets goals/outcomes      Heart Attack, Angina, and Risk Factor Modification:  -Group instruction provided by verbal instruction, video, and written materials to support subject.  Instructors address signs and symptoms of angina and heart attacks.    Also discuss risk factors for heart disease and how to make changes to improve heart health risk factors.   Functional Fitness:  -Group instruction provided by verbal instruction, demonstration, patient participation, and written materials to support subject.  Instructors address safety measures for doing things around the house.  Discuss how to get up and down off the floor, how to pick things up properly, how to safely get out of a chair without assistance, and balance training.   Meditation and Mindfulness:  -Group instruction provided by verbal instruction, patient participation, and written materials to support subject.  Instructor addresses importance  of mindfulness and meditation practice to help reduce stress and improve awareness.  Instructor also leads participants through a meditation exercise.  Flowsheet Row CARDIAC REHAB PHASE II EXERCISE from 12/05/2015 in [place]  Date  10/24/15  Instruction Review Code  2- meets goals/outcomes      Stretching for Flexibility and Mobility:  -Group instruction provided by verbal instruction, patient participation, and written materials to support subject.  Instructors lead participants through series of stretches that are designed to increase flexibility thus improving mobility.  These stretches are additional exercise for major muscle groups that are typically performed during regular warm up and cool down.   Hands Only CPR Anytime:  -Group instruction provided by verbal instruction, video, patient participation and written materials to support subject.  Instructors co-teach with AHA video for hands only CPR.  Participants get hands on experience with mannequins.   Nutrition I class: Heart Healthy Eating:  -Group instruction provided by PowerPoint slides, verbal discussion, and written materials to support subject matter. The instructor gives an explanation and review of the Therapeutic Lifestyle Changes diet recommendations, which includes a discussion on lipid goals, dietary fat, sodium, fiber, plant stanol/sterol esters, sugar, and the components of a well-balanced, healthy diet. Flowsheet Row CARDIAC REHAB PHASE II EXERCISE from 12/05/2015 in [place]  Date  11/21/15  Educator  RD  Instruction Review Code  Not applicable [class handout given]      Nutrition II class: Lifestyle Skills:  -Group instruction provided by PowerPoint slides, verbal discussion, and written materials to support subject matter. The instructor gives an explanation and review of label reading, grocery shopping for heart health, heart healthy recipe  modifications, and ways to make healthier choices when eating out. Flowsheet Row CARDIAC REHAB PHASE II EXERCISE from 12/05/2015 in [place]  Date  11/21/15  Educator  RD  Instruction Review Code  Not  applicable [class handouts given]      Diabetes Question & Answer:  -Group instruction provided by PowerPoint slides, verbal discussion, and written materials to support subject matter. The instructor gives an explanation and review of diabetes co-morbidities, pre- and post-prandial blood glucose goals, pre-exercise blood glucose goals, signs, symptoms, and treatment of hypoglycemia and hyperglycemia, and foot care basics. Flowsheet Row CARDIAC REHAB PHASE II EXERCISE from 12/05/2015 in [place]  Date  11/30/15  Educator  RD  Instruction Review Code  2- meets goals/outcomes      Diabetes Blitz:  -Group instruction provided by PowerPoint slides, verbal discussion, and written materials to support subject matter. The instructor gives an explanation and review of the physiology behind type 1 and type 2 diabetes, diabetes medications and rational behind using different medications, pre- and post-prandial blood glucose recommendations and Hemoglobin A1c goals, diabetes diet, and exercise including blood glucose guidelines for exercising safely.  Flowsheet Row CARDIAC REHAB PHASE II EXERCISE from 12/05/2015 in [place]  Date  11/21/15  Educator  RD  Instruction Review Code  Not applicable [class handout given]      Portion Distortion:  -Group instruction provided by PowerPoint slides, verbal discussion, written materials, and food models to support subject matter. The instructor gives an explanation of serving size versus portion size, changes in portions sizes over the last 20 years, and what consists of a serving from each food group. Flowsheet Row CARDIAC REHAB PHASE II EXERCISE from 12/05/2015 in  [place]  Date  11/28/15  Educator  RD  Instruction Review Code  2- meets goals/outcomes      Stress Management:  -Group instruction provided by verbal instruction, video, and written materials to support subject matter.  Instructors review role of stress in heart disease and how to cope with stress positively.   Flowsheet Row CARDIAC REHAB PHASE II EXERCISE from 12/05/2015 in [place]  Date  11/21/15  Instruction Review Code  2- meets goals/outcomes      Exercising on Your Own:  -Group instruction provided by verbal instruction, power point, and written materials to support subject.  Instructors discuss benefits of exercise, components of exercise, frequency and intensity of exercise, and end points for exercise.  Also discuss use of nitroglycerin and activating EMS.  Review options of places to exercise outside of rehab.  Review guidelines for sex with heart disease.   Cardiac Drugs I:  -Group instruction provided by verbal instruction and written materials to support subject.  Instructor reviews cardiac drug classes: antiplatelets, anticoagulants, beta blockers, and statins.  Instructor discusses reasons, side effects, and lifestyle considerations for each drug class. Flowsheet Row CARDIAC REHAB PHASE II EXERCISE from 12/05/2015 in [place]  Date  12/05/15  Educator  pharmD  Instruction Review Code  2- meets goals/outcomes      Cardiac Drugs II:  -Group instruction provided by verbal instruction and written materials to support subject.  Instructor reviews cardiac drug classes: angiotensin converting enzyme inhibitors (ACE-I), angiotensin II receptor blockers (ARBs), nitrates, and calcium channel blockers.  Instructor discusses reasons, side effects, and lifestyle considerations for each drug class. Flowsheet Row CARDIAC REHAB PHASE II EXERCISE from 12/05/2015 in [place]  Date  10/31/15  Instruction Review Code  2- meets goals/outcomes      Anatomy and Physiology of the Circulatory System:  -Group instruction provided by verbal  instruction, video, and written materials to support subject.  Reviews functional anatomy of heart, how it relates to various diagnoses, and what role the heart plays in the overall system.   Knowledge Questionnaire Score:     Knowledge Questionnaire Score - 10/18/15 1337      Knowledge Questionnaire Score   Pre Score 20/24      Core Components/Risk Factors/Patient Goals at Admission:     Personal Goals and Risk Factors at Admission - 10/18/15 0907      Core Components/Risk Factors/Patient Goals on Admission    Weight Management Yes;Weight Loss   Intervention Weight Management: Develop a combined nutrition and exercise program designed to reach desired caloric intake, while maintaining appropriate intake of nutrient and fiber, sodium and fats, and appropriate energy expenditure required for the weight goal.;Weight Management: Provide education and appropriate resources to help participant work on and attain dietary goals.;Weight Management/Obesity: Establish reasonable short term and long term weight goals.   Expected Outcomes Short Term: Continue to assess and modify interventions until short term weight is achieved;Weight Maintenance: Understanding of the daily nutrition guidelines, which includes 25-35% calories from fat, 7% or less cal from saturated fats, less than 200mg  cholesterol, less than 1.5gm of sodium, & 5 or more servings of fruits and vegetables daily;Weight Loss: Understanding of general recommendations for a balanced deficit meal plan, which promotes 1-2 lb weight loss per week and includes a negative energy balance of [phone] kcal/d;Long Term: Adherence to nutrition and physical activity/exercise program aimed toward attainment of established weight goal;Understanding recommendations for  meals to include 15-35% energy as protein, 25-35% energy from fat, 35-60% energy from carbohydrates, less than 200mg  of dietary cholesterol, 20-35 gm of total fiber daily;Understanding of distribution of calorie intake throughout the day with the consumption of 4-5 meals/snacks   Diabetes Yes   Intervention Provide education about proper nutrition, including hydration, and aerobic/resistive exercise prescription along with prescribed medications to achieve blood glucose in normal ranges: Fasting glucose 65-99 mg/dL;Provide education about signs/symptoms and action to take for hypo/hyperglycemia.   Expected Outcomes Short Term: Participant verbalizes understanding of the signs/symptoms and immediate care of hyper/hypoglycemia, proper foot care and importance of medication, aerobic/resistive exercise and nutrition plan for blood glucose control.;Long Term: Attainment of HbA1C < 7%.   Hypertension Yes   Intervention Provide education on lifestyle modifcations including regular physical activity/exercise, weight management, moderate sodium restriction and increased consumption of fresh fruit, vegetables, and low fat dairy, alcohol moderation, and smoking cessation.;Monitor prescription use compliance.   Expected Outcomes Short Term: Continued assessment and intervention until BP is < 140/39mm HG in hypertensive participants. < 130/89mm HG in hypertensive participants with diabetes, heart failure or chronic kidney disease.;Long Term: Maintenance of blood pressure at goal levels.   Lipids Yes   Intervention Provide education and support for participant on nutrition & aerobic/resistive exercise along with prescribed medications to achieve LDL 70mg , HDL >40mg .   Expected Outcomes Short Term: Participant states understanding of desired cholesterol values and is compliant with medications prescribed. Participant is following exercise prescription and nutrition guidelines.;Long Term: Cholesterol controlled with  medications as prescribed, with individualized exercise RX and with personalized nutrition plan. Value goals: LDL < 70mg , HDL > 40 mg.   Personal Goal Other Yes   Personal Goal short : Be better than before surgery-long: Be more active   Intervention Provide exercise and nutrition guidelines to assist with increased activity levels and weigthloss   Expected Outcomes Be able to lose 5-10lbs and increased exercise capacity  Core Components/Risk Factors/Patient Goals Review:      Goals and Risk Factor Review    Row Name 11/30/15 1449             Core Components/Risk Factors/Patient Goals Review   Personal Goals Review Other       Review Pt feels great and is exercising on off days. Pt is up to 70milles/day walking        Expected Outcomes Pt will continue exercising without any complications          Core Components/Risk Factors/Patient Goals at Discharge (Final Review):      Goals and Risk Factor Review - 11/30/15 1449      Core Components/Risk Factors/Patient Goals Review   Personal Goals Review Other   Review Pt feels great and is exercising on off days. Pt is up to 57milles/day walking    Expected Outcomes Pt will continue exercising without any complications      ITP Comments:     ITP Comments    Row Name 10/18/15 0905           ITP Comments Dr. Fransico Him, Medical Director          Comments: Pt is making expected progress toward personal goals after completing 10 sessions. Recommend continued exercise and life style modification education including  stress management and relaxation techniques to [id number] [email or web address]

## 2015-12-10 ENCOUNTER — Encounter (HOSPITAL_COMMUNITY): Payer: PPO

## 2015-12-11 MED FILL — IRBESARTAN 300 MG TABLET: 300 | 30 days supply | Qty: 30 | Fill #3

## 2015-12-11 MED FILL — METOPROLOL TARTRATE 25 MG T: 25 | 30 days supply | Qty: 90 | Fill #3

## 2015-12-12 ENCOUNTER — Encounter (HOSPITAL_COMMUNITY)
Admission: RE | Admit: 2015-12-12 | Discharge: 2015-12-12 | Disposition: A | Discharge status: Home / Self Care | Payer: PPO | Source: Ambulatory Visit | Attending: Interventional Cardiology | Admitting: Interventional Cardiology

## 2015-12-12 DIAGNOSIS — Z951 Presence of aortocoronary bypass graft: Secondary | ICD-10-CM

## 2015-12-14 ENCOUNTER — Encounter (HOSPITAL_COMMUNITY)
Admission: RE | Admit: 2015-12-14 | Discharge: 2015-12-14 | Disposition: A | Discharge status: Home / Self Care | Payer: PPO | Source: Ambulatory Visit | Attending: Interventional Cardiology | Admitting: Interventional Cardiology

## 2015-12-14 DIAGNOSIS — Z951 Presence of aortocoronary bypass graft: Secondary | ICD-10-CM | POA: Diagnosis not present

## 2015-12-17 ENCOUNTER — Encounter (HOSPITAL_COMMUNITY): Payer: PPO

## 2015-12-19 ENCOUNTER — Encounter (HOSPITAL_COMMUNITY)
Admission: RE | Admit: 2015-12-19 | Discharge: 2015-12-19 | Disposition: A | Discharge status: Home / Self Care | Payer: PPO | Source: Ambulatory Visit | Attending: Interventional Cardiology | Admitting: Interventional Cardiology

## 2015-12-19 DIAGNOSIS — Z951 Presence of aortocoronary bypass graft: Secondary | ICD-10-CM

## 2015-12-21 ENCOUNTER — Encounter (HOSPITAL_COMMUNITY): Payer: PPO

## 2015-12-26 ENCOUNTER — Encounter (HOSPITAL_COMMUNITY)
Admission: RE | Admit: 2015-12-26 | Discharge: 2015-12-26 | Disposition: A | Discharge status: Home / Self Care | Payer: PPO | Source: Ambulatory Visit | Attending: Interventional Cardiology | Admitting: Interventional Cardiology

## 2015-12-26 DIAGNOSIS — I480 Paroxysmal atrial fibrillation: Secondary | ICD-10-CM | POA: Insufficient documentation

## 2015-12-26 DIAGNOSIS — I2510 Atherosclerotic heart disease of native coronary artery without angina pectoris: Secondary | ICD-10-CM | POA: Diagnosis not present

## 2015-12-26 DIAGNOSIS — Z951 Presence of aortocoronary bypass graft: Secondary | ICD-10-CM | POA: Insufficient documentation

## 2015-12-26 DIAGNOSIS — E785 Hyperlipidemia, unspecified: Secondary | ICD-10-CM | POA: Insufficient documentation

## 2015-12-26 DIAGNOSIS — I10 Essential (primary) hypertension: Secondary | ICD-10-CM | POA: Insufficient documentation

## 2015-12-27 MED FILL — METFORMIN HCL ER 500 MG TAB: 500 | 30 days supply | Qty: 60 | Fill #6

## 2015-12-28 ENCOUNTER — Encounter (HOSPITAL_COMMUNITY)
Admission: RE | Admit: 2015-12-28 | Discharge: 2015-12-28 | Disposition: A | Discharge status: Home / Self Care | Payer: PPO | Source: Ambulatory Visit | Attending: Interventional Cardiology | Admitting: Interventional Cardiology

## 2015-12-28 DIAGNOSIS — Z951 Presence of aortocoronary bypass graft: Secondary | ICD-10-CM | POA: Diagnosis not present

## 2015-12-31 ENCOUNTER — Encounter (HOSPITAL_COMMUNITY): Payer: PPO

## 2016-01-02 ENCOUNTER — Encounter (HOSPITAL_COMMUNITY)
Admission: RE | Admit: 2016-01-02 | Discharge: 2016-01-02 | Disposition: A | Discharge status: Home / Self Care | Payer: PPO | Source: Ambulatory Visit | Attending: Interventional Cardiology | Admitting: Interventional Cardiology

## 2016-01-02 DIAGNOSIS — Z951 Presence of aortocoronary bypass graft: Secondary | ICD-10-CM | POA: Diagnosis not present

## 2016-01-03 ENCOUNTER — Ambulatory Visit (INDEPENDENT_AMBULATORY_CARE_PROVIDER_SITE_OTHER): Payer: PPO | Admitting: Pharmacist

## 2016-01-03 DIAGNOSIS — I4891 Unspecified atrial fibrillation: Secondary | ICD-10-CM | POA: Diagnosis not present

## 2016-01-03 DIAGNOSIS — Z5181 Encounter for therapeutic drug level monitoring: Secondary | ICD-10-CM | POA: Diagnosis not present

## 2016-01-03 DIAGNOSIS — Z951 Presence of aortocoronary bypass graft: Secondary | ICD-10-CM | POA: Diagnosis not present

## 2016-01-03 LAB — POCT INR: INR: 2.8

## 2016-01-04 ENCOUNTER — Encounter (HOSPITAL_COMMUNITY)
Admission: RE | Admit: 2016-01-04 | Discharge: 2016-01-04 | Disposition: A | Discharge status: Home / Self Care | Payer: PPO | Source: Ambulatory Visit | Attending: Interventional Cardiology | Admitting: Interventional Cardiology

## 2016-01-04 DIAGNOSIS — Z951 Presence of aortocoronary bypass graft: Secondary | ICD-10-CM

## 2016-01-04 NOTE — Progress Notes (Signed)
Cardiac Individual Treatment Plan  Patient Details  Name: [name] [name] MRN: [id number] Date of Birth: August 10, 1945 Referring Provider:   Flowsheet Row CARDIAC REHAB PHASE II ORIENTATION from 10/18/2015 in [place]  Referring Provider  Daneen Schick, MD      Initial Encounter Date:  [place] PHASE II ORIENTATION from 10/18/2015 in [employer]  Date  10/18/15  Referring Provider  Daneen Schick, MD      Visit Diagnosis: S/P CABG x 4  Patient's Home Medications on Admission:  Current Outpatient Prescriptions:  .  amLODipine (NORVASC) 10 MG tablet, Take 1 tablet (10 mg total) by mouth daily., Disp: 90 tablet, Rfl: 3 .  aspirin 81 MG tablet, Take 81 mg by mouth daily., Disp: , Rfl:  .  Cholecalciferol (VITAMIN D) 2000 units tablet, Take 2,000 Units by mouth 3 (three) times a week. , Disp: , Rfl:  .  Cyanocobalamin (B-12) 2500 MCG TABS, Take 1 tablet by mouth 3 (three) times a week., Disp: , Rfl:  .  irbesartan (AVAPRO) 300 MG tablet, Take 300 mg by mouth daily., Disp: , Rfl:  .  metFORMIN (GLUCOPHAGE-XR) 500 MG 24 hr tablet, Take 1,000 mg by mouth daily. , Disp: , Rfl: 11 .  metoprolol tartrate (LOPRESSOR) 25 MG tablet, Take 1.5 tablets (37.5 mg total) by mouth 2 (two) times daily., Disp: 270 tablet, Rfl: 3 .  simvastatin (ZOCOR) 20 MG tablet, Take 20 mg by mouth daily., Disp: , Rfl:  .  triamcinolone cream (KENALOG) 0.1 %, Apply 1 application topically 2 (two) times daily as needed. Reported on 09/04/2015, Disp: , Rfl:  .  warfarin (COUMADIN) 2.5 MG tablet, Take 1 tablet (2.5 mg total) by mouth daily. And as directed by the coumadin clinic, Disp: 100 tablet, Rfl: 1  Past Medical History: Past Medical History:  Diagnosis Date  . BPH (benign prostatic hyperplasia)    [facility]  . Coronary artery disease    a. Myoview 4/17: EF 47%, anteroseptal, apical anterior, apical septal, apical scar with  peri-infarct ischemia, inferoseptal, inferior, inferolateral, apical inferior infarct with peri-infarct ischemia, high risk // b. LHC 4/17: Proximal RCA 99%, proximal LCx 100%, mid LAD 90%, distal LAD 60%, EF 55%  . Diabetes mellitus   . History of Doppler ultrasound    a. Carotid US 4/17:  bilateral ICA 1-39%  . History of echocardiogram    a. Echo 4/17:  EF 60-65%, normal wall motion, grade 1 diastolic dysfunction  . HLD (hyperlipidemia)   . Hypertension   . LBBB (left bundle branch block)   . Nephrolithiasis     Tobacco Use: History  Smoking Status  . Never Smoker  Smokeless Tobacco  . Never Used    Labs: Recent Review Flowsheet Data    Labs for ITP Cardiac and Pulmonary Rehab Latest Ref Rng & Units 08/13/2015 08/13/2015 08/13/2015 08/13/2015 08/13/2015   Hemoglobin A1c 4.8 - 5.6 % - - - - -   PHART 7.350 - 7.450 7.366 7.341(L) - 7.446 7.353   PCO2ART 35.0 - 45.0 mmHg 39.0 44.6 - 32.3(L) 41.2   HCO3 20.0 - 24.0 mEq/L 22.7 23.8 - 22.1 22.8   TCO2 0 - 100 mmol/L 24 25 22 23 24    ACIDBASEDEF 0.0 - 2.0 mmol/L 3.0(H) 2.0 - 1.0 2.0   O2SAT % 92.0 90.0 - 96.0 96.0      Capillary Blood Glucose: Lab Results  Component Value Date   GLUCAP 106 (  H) 11/14/2015   GLUCAP 116 (H) 11/09/2015   GLUCAP 133 (H) 11/07/2015   GLUCAP 92 11/07/2015   GLUCAP 96 11/07/2015     Exercise Target Goals:    Exercise Program Goal: Individual exercise prescription set with THRR, safety & activity barriers. Participant demonstrates ability to understand and report RPE using BORG scale, to self-measure pulse accurately, and to acknowledge the importance of the exercise prescription.  Exercise Prescription Goal: Starting with aerobic activity 30 plus minutes a day, 3 days per week for initial exercise prescription. Provide home exercise prescription and guidelines that participant acknowledges understanding prior to discharge.  Activity Barriers & Risk Stratification:     Activity Barriers &  Cardiac Risk Stratification - 10/18/15 0906      Activity Barriers & Cardiac Risk Stratification   Activity Barriers None   Cardiac Risk Stratification High      6 Minute Walk:     6 Minute Walk    Row Name 10/18/15 1533         6 Minute Walk   Phase Initial     Distance 1400 feet     Walk Time 6 minutes     # of Rest Breaks 0     MPH 2.65     METS 3     RPE 11     VO2 Peak 10.6     Symptoms No     Resting HR 62 bpm     Resting BP 140/88     Max Ex. HR 76 bpm     Max Ex. BP 164/80     2 Minute Post BP 144/84        Initial Exercise Prescription:     Initial Exercise Prescription - 10/18/15 1500      Date of Initial Exercise RX and Referring Provider   Date 10/18/15   Referring Provider Daneen Schick, MD     Bike   Level 0.5   Minutes 10   METs 2     NuStep   Level 3   Minutes 10   METs 2     Track   Laps 10   Minutes 10   METs 2.74     Prescription Details   Frequency (times per week) 2   Duration Progress to 30 minutes of continuous aerobic without signs/symptoms of physical distress     Intensity   THRR 40-80% of Max Heartrate 60-121   Ratings of Perceived Exertion 11-13   Perceived Dyspnea 0-4     Progression   Progression Continue to progress workloads to maintain intensity without signs/symptoms of physical distress.     Resistance Training   Training Prescription Yes   Weight 2lbs   Reps 10-12      Perform Capillary Blood Glucose checks as needed.  Exercise Prescription Changes:     Exercise Prescription Changes    Row Name 11/01/15 1700 11/16/15 1200 11/28/15 1600 12/13/15 1200 12/31/15 1700     Exercise Review   Progression Yes Yes Yes Yes Yes     Response to Exercise   Blood Pressure (Admit) 130/80 148/90 146/84 138/60 130/70   Blood Pressure (Exercise) 154/76 160/80 150/82 130/60 140/80   Blood Pressure (Exit) 130/80 134/82 132/90 122/62 102/57   Heart Rate (Admit) 62 bpm 72 bpm 53 bpm 60 bpm 55 bpm   Heart Rate  (Exercise) 76 bpm 85 bpm 74 bpm 81 bpm 78 bpm   Heart Rate (Exit) 56 bpm 71 bpm 52 bpm 60 bpm  54 bpm   Rating of Perceived Exertion (Exercise) 11 12 11 11 11    Duration Progress to 30 minutes of continuous aerobic without signs/symptoms of physical distress Progress to 30 minutes of continuous aerobic without signs/symptoms of physical distress Progress to 30 minutes of continuous aerobic without signs/symptoms of physical distress Progress to 30 minutes of continuous aerobic without signs/symptoms of physical distress Progress to 30 minutes of continuous aerobic without signs/symptoms of physical distress   Intensity THRR unchanged THRR unchanged THRR unchanged THRR unchanged THRR unchanged     Progression   Progression Continue to progress workloads to maintain intensity without signs/symptoms of physical distress. Continue to progress workloads to maintain intensity without signs/symptoms of physical distress. Continue to progress workloads to maintain intensity without signs/symptoms of physical distress. Continue to progress workloads to maintain intensity without signs/symptoms of physical distress. Continue to progress workloads to maintain intensity without signs/symptoms of physical distress.   Average METs 2.9 2.9 3.2 3.2 3.3     Resistance Training   Training Prescription Yes Yes Yes Yes Yes   Weight 1lb 5lbs 5lbs 5lbs 5lbs   Reps 10-12 10-12 10-12 10-12 10-12     Bike   Level 0.8 0.8 0.8 1.2 1.2   Minutes 10 10 10 10 10    METs 2.81 2.77 2.77 3.65 3.62     NuStep   Level 3 3 3 4 4    Minutes 10 10 10 10 10    METs 4.1 2.4 3.1 3.2 3.3     Track   Laps 13 9 13 9 13    Minutes 10 10 10 10 10    METs 3.26 2.57 3.26 2.57 3.26     Home Exercise Plan   Plans to continue exercise at  - Home  reviewed on 10/31/15 see progress note Home  reviewed on 10/31/15 see progress note Home  reviewed on 10/31/15 see progress note Home  reviewed on 10/31/15 see progress note   Frequency  - Add 2  additional days to program exercise sessions. Add 2 additional days to program exercise sessions. Add 2 additional days to program exercise sessions. Add 2 additional days to program exercise sessions.      Exercise Comments:     Exercise Comments    Row Name 11/01/15 1702 11/30/15 1223 12/13/15 1241 12/31/15 1717     Exercise Comments Reviewed METs and activity levels with pt. Pt is tolerating exercise well; will continue to monitor exercise progression Reviewed METs and activity levels with pt. Pt is tolerating exercise well; will continue to monitor exercise progression Reviewed METs and activity levels with pt. Pt is tolerating exercise well; will continue to monitor exercise progression Reviewed METs and goals with pt. Pt is tolerating exercise well; will continue to monitor exercise progression       Discharge Exercise Prescription (Final Exercise Prescription Changes):     Exercise Prescription Changes - 12/31/15 1700      Exercise Review   Progression Yes     Response to Exercise   Blood Pressure (Admit) 130/70   Blood Pressure (Exercise) 140/80   Blood Pressure (Exit) 102/57   Heart Rate (Admit) 55 bpm   Heart Rate (Exercise) 78 bpm   Heart Rate (Exit) 54 bpm   Rating of Perceived Exertion (Exercise) 11   Duration Progress to 30 minutes of continuous aerobic without signs/symptoms of physical distress   Intensity THRR unchanged     Progression   Progression Continue to progress workloads to maintain intensity without signs/symptoms of  physical distress.   Average METs 3.3     Resistance Training   Training Prescription Yes   Weight 5lbs   Reps 10-12     Bike   Level 1.2   Minutes 10   METs 3.62     NuStep   Level 4   Minutes 10   METs 3.3     Track   Laps 13   Minutes 10   METs 3.26     Home Exercise Plan   Plans to continue exercise at Home  reviewed on 10/31/15 see progress note   Frequency Add 2 additional days to program exercise sessions.       Nutrition:  Target Goals: Understanding of nutrition guidelines, daily intake of sodium 1500mg , cholesterol 200mg , calories 30% from fat and 7% or less from saturated fats, daily to have 5 or more servings of fruits and vegetables.  Biometrics:     Pre Biometrics - 10/18/15 1532      Pre Biometrics   Waist Circumference 41.5 inches   Hip Circumference 42 inches   Waist to Hip Ratio 0.99 %   Triceps Skinfold 14 mm   % Body Fat 28.5 %   Grip Strength 44 kg   Flexibility 11 in   Single Leg Stand 19 seconds       Nutrition Therapy Plan and Nutrition Goals:   Nutrition Discharge: Nutrition Scores:     Nutrition Assessments - 11/21/15 1415      MEDFICTS Scores   Pre Score 33      Nutrition Goals Re-Evaluation:   Psychosocial: Target Goals: Acknowledge presence or absence of depression, maximize coping skills, provide positive support system. Participant is able to verbalize types and ability to use techniques and skills needed for reducing stress and depression.  Initial Review & Psychosocial Screening:     Initial Psych Review & Screening - 10/24/15 [place]? Yes     Barriers   Psychosocial barriers to participate in program The patient should benefit from training in stress management and relaxation.;There are no identifiable barriers or psychosocial needs.     Screening Interventions   Interventions Encouraged to exercise      Quality of Life Scores:     Quality of Life - 10/18/15 1338      Quality of Life Scores   Health/Function Pre 24.04 %   Socioeconomic Pre 25 %   Psych/Spiritual Pre 22.5 %   Family Pre 27 %   GLOBAL Pre 24.38 %      PHQ-9: Recent Review Flowsheet Data    Depression screen [facility] 2/9 10/24/2015 08/30/2015   Decreased Interest 0 0   Down, Depressed, Hopeless 0 0   PHQ - 2 Score 0 0      Psychosocial Evaluation and Intervention:     Psychosocial Evaluation - 01/04/16 1426       Psychosocial Evaluation & Interventions   Comments pt health related anxiety has resolved since improved BP control.  pt expresses more confidence and comfort in his exercise routine. pt notes that he is able to do more with less effort and fatigue.    Continued Psychosocial Services Needed No      Psychosocial Re-Evaluation:     Psychosocial Re-Evaluation    [place] Name 11/02/15 1447             Psychosocial Re-Evaluation   Interventions Stress management education;Relaxation education;Encouraged to attend Cardiac Rehabilitation for the exercise  Comments pt        Continued Psychosocial Services Needed Yes          Vocational Rehabilitation: Provide vocational rehab assistance to qualifying candidates.   Vocational Rehab Evaluation & Intervention:     Vocational Rehab - 10/19/15 1440      Initial Vocational Rehab Evaluation & Intervention   Assessment shows need for Vocational Rehabilitation No      Education: Education Goals: Education classes will be provided on a weekly basis, covering required topics. Participant will state understanding/return demonstration of topics presented.  Learning Barriers/Preferences:     Learning Barriers/Preferences - 10/19/15 1441      Learning Barriers/Preferences   Learning Barriers Sight  reading glasses      Education Topics: Count Your Pulse:  -Group instruction provided by verbal instruction, demonstration, patient participation and written materials to support subject.  Instructors address importance of being able to find your pulse and how to count your pulse when at home without a heart monitor.  Patients get hands on experience counting their pulse with staff help and individually. Flowsheet Row CARDIAC REHAB PHASE II EXERCISE from 01/02/2016 in [place]  Date  10/26/15  Instruction Review Code  2- meets goals/outcomes      Heart Attack, Angina, and Risk Factor Modification:   -Group instruction provided by verbal instruction, video, and written materials to support subject.  Instructors address signs and symptoms of angina and heart attacks.    Also discuss risk factors for heart disease and how to make changes to improve heart health risk factors. Flowsheet Row CARDIAC REHAB PHASE II EXERCISE from 01/02/2016 in [place]  Date  12/12/15  Educator  RN  Instruction Review Code  2- meets goals/outcomes      Functional Fitness:  -Group instruction provided by verbal instruction, demonstration, patient participation, and written materials to support subject.  Instructors address safety measures for doing things around the house.  Discuss how to get up and down off the floor, how to pick things up properly, how to safely get out of a chair without assistance, and balance training. Flowsheet Row CARDIAC REHAB PHASE II EXERCISE from 01/02/2016 in [place]  Date  12/07/15  Instruction Review Code  2- meets goals/outcomes      Meditation and Mindfulness:  -Group instruction provided by verbal instruction, patient participation, and written materials to support subject.  Instructor addresses importance of mindfulness and meditation practice to help reduce stress and improve awareness.  Instructor also leads participants through a meditation exercise.  Flowsheet Row CARDIAC REHAB PHASE II EXERCISE from 01/02/2016 in [place]  Date  10/24/15  Instruction Review Code  2- meets goals/outcomes      Stretching for Flexibility and Mobility:  -Group instruction provided by verbal instruction, patient participation, and written materials to support subject.  Instructors lead participants through series of stretches that are designed to increase flexibility thus improving mobility.  These stretches are additional exercise for major muscle groups that are typically performed during  regular warm up and cool down.   Hands Only CPR Anytime:  -Group instruction provided by verbal instruction, video, patient participation and written materials to support subject.  Instructors co-teach with [facility] video for hands only CPR.  Participants get hands on experience with mannequins.   Nutrition I class: Heart Healthy Eating:  -Group instruction provided by PowerPoint slides, verbal discussion, and written materials to  support subject matter. The instructor gives an explanation and review of the Therapeutic Lifestyle Changes diet recommendations, which includes a discussion on lipid goals, dietary fat, sodium, fiber, plant stanol/sterol esters, sugar, and the components of a well-balanced, healthy diet. Flowsheet Row CARDIAC REHAB PHASE II EXERCISE from 01/02/2016 in [place]  Date  11/21/15  Educator  RD  Instruction Review Code  Not applicable [class handout given]      Nutrition II class: Lifestyle Skills:  -Group instruction provided by PowerPoint slides, verbal discussion, and written materials to support subject matter. The instructor gives an explanation and review of label reading, grocery shopping for heart health, heart healthy recipe modifications, and ways to make healthier choices when eating out. Flowsheet Row CARDIAC REHAB PHASE II EXERCISE from 01/02/2016 in [place]  Date  11/21/15  Educator  RD  Instruction Review Code  Not applicable [class handouts given]      Diabetes Question & Answer:  -Group instruction provided by PowerPoint slides, verbal discussion, and written materials to support subject matter. The instructor gives an explanation and review of diabetes co-morbidities, pre- and post-prandial blood glucose goals, pre-exercise blood glucose goals, signs, symptoms, and treatment of hypoglycemia and hyperglycemia, and foot care basics. Flowsheet Row CARDIAC REHAB PHASE II EXERCISE from  01/02/2016 in [place]  Date  12/28/15  Educator  RD  Instruction Review Code  2- meets goals/outcomes      Diabetes Blitz:  -Group instruction provided by PowerPoint slides, verbal discussion, and written materials to support subject matter. The instructor gives an explanation and review of the physiology behind type 1 and type 2 diabetes, diabetes medications and rational behind using different medications, pre- and post-prandial blood glucose recommendations and Hemoglobin A1c goals, diabetes diet, and exercise including blood glucose guidelines for exercising safely.  Flowsheet Row CARDIAC REHAB PHASE II EXERCISE from 01/02/2016 in [place]  Date  11/21/15  Educator  RD  Instruction Review Code  Not applicable [class handout given]      Portion Distortion:  -Group instruction provided by PowerPoint slides, verbal discussion, written materials, and food models to support subject matter. The instructor gives an explanation of serving size versus portion size, changes in portions sizes over the last 20 years, and what consists of a serving from each food group. Flowsheet Row CARDIAC REHAB PHASE II EXERCISE from 01/02/2016 in [place]  Date  11/28/15  Educator  RD  Instruction Review Code  2- meets goals/outcomes      Stress Management:  -Group instruction provided by verbal instruction, video, and written materials to support subject matter.  Instructors review role of stress in heart disease and how to cope with stress positively.   Flowsheet Row CARDIAC REHAB PHASE II EXERCISE from 01/02/2016 in [place]  Date  11/21/15  Instruction Review Code  2- meets goals/outcomes      Exercising on Your Own:  -Group instruction provided by verbal instruction, power point, and written materials to support subject.  Instructors discuss benefits of exercise,  components of exercise, frequency and intensity of exercise, and end points for exercise.  Also discuss use of nitroglycerin and activating EMS.  Review options of places to exercise outside of rehab.  Review guidelines for sex with heart disease.   Cardiac Drugs I:  -Group instruction provided by verbal instruction and written materials to support subject.  Instructor reviews cardiac drug classes: antiplatelets, anticoagulants, beta blockers, and statins.  Instructor discusses reasons, side effects, and lifestyle considerations for each drug class. Flowsheet Row CARDIAC REHAB PHASE II EXERCISE from 01/02/2016 in [place]  Date  12/05/15  Educator  pharmD  Instruction Review Code  2- meets goals/outcomes      Cardiac Drugs II:  -Group instruction provided by verbal instruction and written materials to support subject.  Instructor reviews cardiac drug classes: angiotensin converting enzyme inhibitors (ACE-I), angiotensin II receptor blockers (ARBs), nitrates, and calcium channel blockers.  Instructor discusses reasons, side effects, and lifestyle considerations for each drug class. Flowsheet Row CARDIAC REHAB PHASE II EXERCISE from 01/02/2016 in [place]  Date  01/02/16  Instruction Review Code  2- meets goals/outcomes      Anatomy and Physiology of the Circulatory System:  -Group instruction provided by verbal instruction, video, and written materials to support subject.  Reviews functional anatomy of heart, how it relates to various diagnoses, and what role the heart plays in the overall system. Flowsheet Row CARDIAC REHAB PHASE II EXERCISE from 01/02/2016 in [place]  Date  12/26/15  Instruction Review Code  2- meets goals/outcomes      Knowledge Questionnaire Score:     Knowledge Questionnaire Score - 10/18/15 1337      Knowledge Questionnaire Score   Pre Score 20/24      Core  Components/Risk Factors/Patient Goals at Admission:     Personal Goals and Risk Factors at Admission - 10/18/15 0907      Core Components/Risk Factors/Patient Goals on Admission    Weight Management Yes;Weight Loss   Intervention Weight Management: Develop a combined nutrition and exercise program designed to reach desired caloric intake, while maintaining appropriate intake of nutrient and fiber, sodium and fats, and appropriate energy expenditure required for the weight goal.;Weight Management: Provide education and appropriate resources to help participant work on and attain dietary goals.;Weight Management/Obesity: Establish reasonable short term and long term weight goals.   Expected Outcomes Short Term: Continue to assess and modify interventions until short term weight is achieved;Weight Maintenance: Understanding of the daily nutrition guidelines, which includes 25-35% calories from fat, 7% or less cal from saturated fats, less than 200mg  cholesterol, less than 1.5gm of sodium, & 5 or more servings of fruits and vegetables daily;Weight Loss: Understanding of general recommendations for a balanced deficit meal plan, which promotes 1-2 lb weight loss per week and includes a negative energy balance of [phone] kcal/d;Long Term: Adherence to nutrition and physical activity/exercise program aimed toward attainment of established weight goal;Understanding recommendations for meals to include 15-35% energy as protein, 25-35% energy from fat, 35-60% energy from carbohydrates, less than 200mg  of dietary cholesterol, 20-35 gm of total fiber daily;Understanding of distribution of calorie intake throughout the day with the consumption of 4-5 meals/snacks   Diabetes Yes   Intervention Provide education about proper nutrition, including hydration, and aerobic/resistive exercise prescription along with prescribed medications to achieve blood glucose in normal ranges: Fasting glucose 65-99 mg/dL;Provide education  about signs/symptoms and action to take for hypo/hyperglycemia.   Expected Outcomes Short Term: Participant verbalizes understanding of the signs/symptoms and immediate care of hyper/hypoglycemia, proper foot care and importance of medication, aerobic/resistive exercise and nutrition plan for blood glucose control.;Long Term: Attainment of HbA1C < 7%.   Hypertension Yes   Intervention Provide education on lifestyle modifcations including regular physical activity/exercise, weight management, moderate sodium restriction and  increased consumption of fresh fruit, vegetables, and low fat dairy, alcohol moderation, and smoking cessation.;Monitor prescription use compliance.   Expected Outcomes Short Term: Continued assessment and intervention until BP is < 140/28mm HG in hypertensive participants. < 130/17mm HG in hypertensive participants with diabetes, heart failure or chronic kidney disease.;Long Term: Maintenance of blood pressure at goal levels.   Lipids Yes   Intervention Provide education and support for participant on nutrition & aerobic/resistive exercise along with prescribed medications to achieve LDL 70mg , HDL >40mg .   Expected Outcomes Short Term: Participant states understanding of desired cholesterol values and is compliant with medications prescribed. Participant is following exercise prescription and nutrition guidelines.;Long Term: Cholesterol controlled with medications as prescribed, with individualized exercise RX and with personalized nutrition plan. Value goals: LDL < 70mg , HDL > 40 mg.   Personal Goal Other Yes   Personal Goal short : Be better than before surgery-long: Be more active   Intervention Provide exercise and nutrition guidelines to assist with increased activity levels and weigthloss   Expected Outcomes Be able to lose 5-10lbs and increased exercise capacity      Core Components/Risk Factors/Patient Goals Review:      Goals and Risk Factor Review    Row Name  11/30/15 1449 12/31/15 1717 01/04/16 1426         Core Components/Risk Factors/Patient Goals Review   Personal Goals Review Other  -  -     Review Pt feels great and is exercising on off days. Pt is up to 15milles/day walking  Pt is very complaint with HEP and has noticed an increase in energy levels and able to do yardwork without fatigue Pt is very complaint with HEP and has noticed an increase in energy levels and able to do yardwork without fatigue     Expected Outcomes Pt will continue exercising without any complications Pt will continue to perform ADL's and chores with fatigue or extreme exhaustion Pt will continue to perform ADL's and chores without  fatigue or extreme exhaustion        Core Components/Risk Factors/Patient Goals at Discharge (Final Review):      Goals and Risk Factor Review - 01/04/16 1426      Core Components/Risk Factors/Patient Goals Review   Review Pt is very complaint with HEP and has noticed an increase in energy levels and able to do yardwork without fatigue   Expected Outcomes Pt will continue to perform ADL's and chores without  fatigue or extreme exhaustion      ITP Comments:     ITP Comments    Row Name 10/18/15 0905           ITP Comments Dr. Fransico Him, Medical Director          Comments: Pt is making expected progress toward personal goals after completing 17 sessions. Recommend continued exercise and life style modification education including  stress management and relaxation techniques to decrease cardiac risk profile.

## 2016-01-07 ENCOUNTER — Encounter (HOSPITAL_COMMUNITY): Payer: PPO

## 2016-01-07 MED FILL — METOPROLOL TARTRATE 25 MG T: 25 | 30 days supply | Qty: 90 | Fill #4

## 2016-01-07 MED FILL — IRBESARTAN 300 MG TABLET: 300 | 30 days supply | Qty: 30 | Fill #4

## 2016-01-09 ENCOUNTER — Encounter (HOSPITAL_COMMUNITY)
Admission: RE | Admit: 2016-01-09 | Discharge: 2016-01-09 | Disposition: A | Discharge status: Home / Self Care | Payer: PPO | Source: Ambulatory Visit | Attending: Interventional Cardiology | Admitting: Interventional Cardiology

## 2016-01-09 DIAGNOSIS — Z951 Presence of aortocoronary bypass graft: Secondary | ICD-10-CM | POA: Diagnosis not present

## 2016-01-11 ENCOUNTER — Ambulatory Visit (INDEPENDENT_AMBULATORY_CARE_PROVIDER_SITE_OTHER): Payer: PPO | Admitting: Interventional Cardiology

## 2016-01-11 ENCOUNTER — Encounter: Payer: Self-pay | Admitting: Interventional Cardiology

## 2016-01-11 ENCOUNTER — Encounter (HOSPITAL_COMMUNITY)
Admission: RE | Admit: 2016-01-11 | Discharge: 2016-01-11 | Disposition: A | Discharge status: Home / Self Care | Payer: PPO | Source: Ambulatory Visit | Attending: Interventional Cardiology | Admitting: Interventional Cardiology

## 2016-01-11 VITALS — BP 138/64 | HR 69 | Ht 67.0 in | Wt 190.1 lb

## 2016-01-11 DIAGNOSIS — I10 Essential (primary) hypertension: Secondary | ICD-10-CM

## 2016-01-11 DIAGNOSIS — Z951 Presence of aortocoronary bypass graft: Secondary | ICD-10-CM

## 2016-01-11 DIAGNOSIS — I480 Paroxysmal atrial fibrillation: Secondary | ICD-10-CM

## 2016-01-11 DIAGNOSIS — I2510 Atherosclerotic heart disease of native coronary artery without angina pectoris: Secondary | ICD-10-CM | POA: Diagnosis not present

## 2016-01-11 DIAGNOSIS — Z5181 Encounter for therapeutic drug level monitoring: Secondary | ICD-10-CM

## 2016-01-11 NOTE — Patient Instructions (Signed)
Medication Instructions:  Your physician recommends that you continue on your current medications as directed. Please refer to the Current Medication list given to you today.   Labwork: None ordered  Testing/Procedures: Your physician has recommended that you wear a holter monitor. Holter monitors are medical devices that record the heart's electrical activity. Doctors most often use these monitors to diagnose arrhythmias. Arrhythmias are problems with the speed or rhythm of the heartbeat. The monitor is a small, portable device. You can wear one while you do your normal daily activities. This is usually used to diagnose what is causing palpitations/syncope (passing out).    Follow-Up: Your physician wants you to follow-up in: 6 months with Dr.Smith You will receive a reminder letter in the mail two months in advance. If you don't receive a letter, please call our office to schedule the follow-up appointment.   Any Other Special Instructions Will Be Listed Below (If Applicable).     If you need a refill on your cardiac medications before your next appointment, please call your pharmacy.

## 2016-01-11 NOTE — Progress Notes (Signed)
Cardiology Office Note    Date:  01/11/2016    ID:  Success, Dalley 04/15/1946, MRN [id number]  PCP:  Jonathon Bellows, MD  Cardiologist: Sinclair Grooms, MD   Chief Complaint  Patient presents with  . Coronary Artery Disease  . Atrial Fibrillation    History of Present Illness:  [name] [name] is a 70 y.o. male with a hx of CAD s/p CABG, LBBB, HTN, HL, DM2, FHx CAD.  He was evaluated for DOE and episodic L arm pain in 3/17.  A Myoview was high risk and LHC demonstrated severe 3 vessel CAD. He underwent CABG with LIMA-LAD, SVG-PDA/PL 1, SVG-OM. Postoperative course was complicated by atrial fibrillation and was placed on amiodarone. He had recurrent episodes of atrial fibrillation and was placed on Coumadin.  His Amiodarone was DC'd in 6/17.  Plan is to consider stopping Coumadin once he is off of Amiodarone for 3 mos and maintaining NSR.    No trouble in the interim since he was last seen. He denies chest discomfort. He has had no palpitations or syncope. No lower extremity swelling. No medication side effects. Blood pressure did increase significantly when amlodipine was transiently discontinued because he developed a rash. As it turned out the rash was not related to amlodipine. Once the medication was resumed pressures been under great control.   Past Medical History:  Diagnosis Date  . BPH (benign prostatic hyperplasia)    [facility]  . Coronary artery disease    a. Myoview 4/17: EF 47%, anteroseptal, apical anterior, apical septal, apical scar with peri-infarct ischemia, inferoseptal, inferior, inferolateral, apical inferior infarct with peri-infarct ischemia, high risk // b. LHC 4/17: Proximal RCA 99%, proximal LCx 100%, mid LAD 90%, distal LAD 60%, EF 55%  . Diabetes mellitus   . History of Doppler ultrasound    a. Carotid US 4/17:  bilateral ICA 1-39%  . History of echocardiogram    a. Echo 4/17:  EF 60-65%, normal wall motion, grade 1 diastolic dysfunction  . HLD  (hyperlipidemia)   . Hypertension   . LBBB (left bundle branch block)   . Nephrolithiasis     Past Surgical History:  Procedure Laterality Date  . APPENDECTOMY  1976  . CARDIAC CATHETERIZATION N/A 08/03/2015   Procedure: Left Heart Cath and Coronary Angiography;  Surgeon: Belva Crome, MD;  Location: [facility];  Service: Cardiovascular;  Laterality: N/A;  . COLONOSCOPY    . CORONARY ARTERY BYPASS GRAFT N/A 08/13/2015   Procedure: CORONARY ARTERY BYPASS GRAFTING (CABG) x 4 using left internal mammary artery and right leg saphenous vein;  Surgeon: Gaye Pollack, MD;  Location: [place];  Service: Open Heart Surgery;  Laterality: N/A;  . TEE WITHOUT CARDIOVERSION N/A 08/13/2015   Procedure: TRANSESOPHAGEAL ECHOCARDIOGRAM (TEE);  Surgeon: Gaye Pollack, MD;  Location: [facility];  Service: Open Heart Surgery;  Laterality: N/A;    Current Medications: Outpatient Medications Prior to Visit  Medication Sig Dispense Refill  . amLODipine (NORVASC) 10 MG tablet Take 1 tablet (10 mg total) by mouth daily. 90 tablet 3  . aspirin 81 MG tablet Take 81 mg by mouth daily.    . Cholecalciferol (VITAMIN D) 2000 units tablet Take 2,000 Units by mouth 3 (three) times a week.     . Cyanocobalamin (B-12) 2500 MCG TABS Take 1 tablet by mouth 3 (three) times a week.    . irbesartan (AVAPRO) 300 MG tablet Take 300 mg by mouth  daily.    . metFORMIN (GLUCOPHAGE-XR) 500 MG 24 hr tablet Take 1,000 mg by mouth daily.   11  . metoprolol tartrate (LOPRESSOR) 25 MG tablet Take 1.5 tablets (37.5 mg total) by mouth 2 (two) times daily. 270 tablet 3  . simvastatin (ZOCOR) 20 MG tablet Take 20 mg by mouth daily.    Marland Kitchen triamcinolone cream (KENALOG) 0.1 % Apply 1 application topically 2 (two) times daily as needed. Reported on 09/04/2015    . warfarin (COUMADIN) 2.5 MG tablet Take 1 tablet (2.5 mg total) by mouth daily. And as directed by the coumadin clinic 100 tablet 1   No facility-administered medications prior to  visit.      Allergies:   Review of patient's allergies indicates no active allergies.   Social History   Social History  . Marital status: Married    Spouse name: N/A  . Number of children: N/A  . Years of education: N/A   Social History Main Topics  . Smoking status: Never Smoker  . Smokeless tobacco: Never Used  . Alcohol use No  . Drug use: No  . Sexual activity: Not Asked   Other Topics Concern  . None   Social History Narrative   Retired   Married; 1 Licensed [employer] x 30 years   Originally from [place] History:  The patient's family history includes Heart attack (age of onset: 67) in his father; Heart disease in his father and mother; Heart failure (age of onset: 89) in his mother.   ROS:   Please see the history of present illness.    Recurring lower extremity rash. Some itching. No syncope. No palpitations.  All other systems reviewed and are negative.   PHYSICAL EXAM:   VS:  BP 138/64   Pulse 69   Ht 5\' 7"  (1.702 m)   Wt 190 lb 1.9 oz (86.2 kg)   SpO2 97%   BMI 29.78 kg/m    GEN: Well nourished, well developed, in no acute distress  HEENT: normal  Neck: no JVD, carotid bruits, or masses Cardiac: RRR; no murmurs, rubs, or gallops,no edema  Respiratory:  clear to auscultation bilaterally, normal work of breathing GI: soft, nontender, nondistended, + BS MS: no deformity or atrophy  Skin: warm and dry, no rash Neuro:  Alert and Oriented x 3, Strength and sensation are intact Psych: euthymic mood, full affect  Wt Readings from Last 3 Encounters:  01/11/16 190 lb 1.9 oz (86.2 kg)  11/30/15 186 lb 12.8 oz (84.7 kg)  11/26/15 185 lb (83.9 kg)      Studies/Labs Reviewed:   EKG:  EKG  none  Recent Labs: 08/10/2015: ALT 37 08/14/2015: Magnesium 2.0 08/16/2015: Hemoglobin 10.2; Platelets 127 08/18/2015: BUN 17; Creatinine, Ser 1.14; Potassium 4.1; Sodium 139   Lipid Panel No results found for: CHOL, TRIG, HDL,  CHOLHDL, VLDL, LDLCALC, LDLDIRECT  Additional studies/ records that were reviewed today include:  No new data.    ASSESSMENT:    1. Paroxysmal atrial fibrillation (HCC)   2. Coronary artery disease involving native coronary artery of native heart without angina pectoris   3. Essential hypertension   4. S/P CABG x 4   5. Encounter for therapeutic drug monitoring      PLAN:  In order of problems listed above:  1. No symptomatic episodes of atrial fibrillation. We will perform a 48-hour Holter monitor. Amiodarone discontinued now for greater than 3 months. If no  atrial fibrillation is present, Coumadin will be discontinued. 2. Asymptomatic 3. Low-salt diet. Well controlled blood pressure. Target 140/90 mmHg or less. 4. He is being cleared/discharged by surgery. 5. Willl discontinue Coumadin if no atrial fibrillation is noted after monitoring.    Medication Adjustments/Labs and Tests Ordered: Current medicines are reviewed at length with the patient today.  Concerns regarding medicines are outlined above.  Medication changes, Labs and Tests ordered today are listed in the Patient Instructions below. Patient Instructions  Medication Instructions:  Your physician recommends that you continue on your current medications as directed. Please refer to the Current Medication list given to you today.   Labwork: None ordered  Testing/Procedures: Your physician has recommended that you wear a holter monitor. Holter monitors are medical devices that record the heart's electrical activity. Doctors most often use these monitors to diagnose arrhythmias. Arrhythmias are problems with the speed or rhythm of the heartbeat. The monitor is a small, portable device. You can wear one while you do your normal daily activities. This is usually used to diagnose what is causing palpitations/syncope (passing out).    Follow-Up: Your physician wants you to follow-up in: 6 months with Dr.Kaylianna Detert You will  receive a reminder letter in the mail two months in advance. If you don't receive a letter, please call our office to schedule the follow-up appointment.   Any Other Special Instructions Will Be Listed Below (If Applicable).     If you need a refill on your cardiac medications before your next appointment, please call your pharmacy.      Signed, Sinclair Grooms, MD  01/11/2016 12:09 PM    [facility] [place], [place], [place]  [address] Phone: [phone]; Fax: [phone]

## 2016-01-14 ENCOUNTER — Encounter (HOSPITAL_COMMUNITY): Payer: PPO

## 2016-01-15 ENCOUNTER — Ambulatory Visit (INDEPENDENT_AMBULATORY_CARE_PROVIDER_SITE_OTHER): Payer: PPO

## 2016-01-15 DIAGNOSIS — I480 Paroxysmal atrial fibrillation: Secondary | ICD-10-CM

## 2016-01-16 ENCOUNTER — Encounter (HOSPITAL_COMMUNITY)
Admission: RE | Admit: 2016-01-16 | Discharge: 2016-01-16 | Disposition: A | Discharge status: Home / Self Care | Payer: PPO | Source: Ambulatory Visit | Attending: Interventional Cardiology | Admitting: Interventional Cardiology

## 2016-01-16 DIAGNOSIS — Z951 Presence of aortocoronary bypass graft: Secondary | ICD-10-CM

## 2016-01-18 ENCOUNTER — Encounter (HOSPITAL_COMMUNITY)
Admission: RE | Admit: 2016-01-18 | Discharge: 2016-01-18 | Disposition: A | Discharge status: Home / Self Care | Payer: PPO | Source: Ambulatory Visit | Attending: Interventional Cardiology | Admitting: Interventional Cardiology

## 2016-01-18 DIAGNOSIS — Z951 Presence of aortocoronary bypass graft: Secondary | ICD-10-CM | POA: Diagnosis not present

## 2016-01-21 ENCOUNTER — Encounter (HOSPITAL_COMMUNITY): Payer: PPO

## 2016-01-23 ENCOUNTER — Telehealth: Payer: Self-pay | Admitting: *Deleted

## 2016-01-23 ENCOUNTER — Encounter (HOSPITAL_COMMUNITY)
Admission: RE | Admit: 2016-01-23 | Discharge: 2016-01-23 | Disposition: A | Discharge status: Home / Self Care | Payer: PPO | Source: Ambulatory Visit | Attending: Interventional Cardiology | Admitting: Interventional Cardiology

## 2016-01-23 DIAGNOSIS — E785 Hyperlipidemia, unspecified: Secondary | ICD-10-CM | POA: Diagnosis not present

## 2016-01-23 DIAGNOSIS — Z951 Presence of aortocoronary bypass graft: Secondary | ICD-10-CM | POA: Insufficient documentation

## 2016-01-23 DIAGNOSIS — I10 Essential (primary) hypertension: Secondary | ICD-10-CM | POA: Diagnosis not present

## 2016-01-23 DIAGNOSIS — I2510 Atherosclerotic heart disease of native coronary artery without angina pectoris: Secondary | ICD-10-CM | POA: Insufficient documentation

## 2016-01-23 DIAGNOSIS — I480 Paroxysmal atrial fibrillation: Secondary | ICD-10-CM | POA: Diagnosis not present

## 2016-01-23 NOTE — Telephone Encounter (Signed)
-----   Message from Belva Crome, MD sent at 01/23/2016 12:59 PM EDT ----- Let the patient know that atrial fibrillation was not identified. He should discontinue Coumadin therapy. Continue aspirin. Call if concerns or questions. A copy will be sent to Jonathon Bellows, MD

## 2016-01-23 NOTE — Telephone Encounter (Signed)
Advised wife of monitor results and recommendations per Dr. Tamala Julian.  DPR on file.  Wife verbalized understanding and was in agreement with plan.

## 2016-01-25 ENCOUNTER — Encounter (HOSPITAL_COMMUNITY)
Admission: RE | Admit: 2016-01-25 | Discharge: 2016-01-25 | Disposition: A | Discharge status: Home / Self Care | Payer: PPO | Source: Ambulatory Visit | Attending: Interventional Cardiology | Admitting: Interventional Cardiology

## 2016-01-25 DIAGNOSIS — Z951 Presence of aortocoronary bypass graft: Secondary | ICD-10-CM

## 2016-01-25 DIAGNOSIS — Z23 Encounter for immunization: Secondary | ICD-10-CM | POA: Diagnosis not present

## 2016-01-28 ENCOUNTER — Encounter (HOSPITAL_COMMUNITY): Payer: PPO

## 2016-01-28 ENCOUNTER — Encounter (HOSPITAL_COMMUNITY)
Admission: RE | Admit: 2016-01-28 | Discharge: 2016-01-28 | Disposition: A | Discharge status: Home / Self Care | Payer: PPO | Source: Ambulatory Visit | Attending: Cardiovascular Disease | Admitting: Cardiovascular Disease

## 2016-01-28 DIAGNOSIS — Z951 Presence of aortocoronary bypass graft: Secondary | ICD-10-CM | POA: Diagnosis not present

## 2016-01-28 MED FILL — METFORMIN HCL ER 500 MG TAB: 500 | 30 days supply | Qty: 60 | Fill #7

## 2016-01-30 ENCOUNTER — Encounter (HOSPITAL_COMMUNITY)
Admission: RE | Admit: 2016-01-30 | Discharge: 2016-01-30 | Disposition: A | Discharge status: Home / Self Care | Payer: PPO | Source: Ambulatory Visit | Attending: Interventional Cardiology | Admitting: Interventional Cardiology

## 2016-01-30 DIAGNOSIS — Z951 Presence of aortocoronary bypass graft: Secondary | ICD-10-CM

## 2016-01-30 NOTE — Progress Notes (Signed)
Cardiac Individual Treatment Plan  Patient Details  Name: [name] [name] MRN: [id number] Date of Birth: [birth year] Referring Provider:   Flowsheet Row CARDIAC REHAB PHASE II ORIENTATION from 10/18/2015 in [place]  Referring Provider  Daneen Schick, MD      Initial Encounter Date:  August PHASE II ORIENTATION from 10/18/2015 in [place]  Date  10/18/15  Referring Provider  Daneen Schick, MD      Visit Diagnosis: S/P CABG x 4  Patient's Home Medications on Admission:  Current Outpatient Prescriptions:  .  amLODipine (NORVASC) 10 MG tablet, Take 1 tablet (10 mg total) by mouth daily., Disp: 90 tablet, Rfl: 3 .  aspirin 81 MG tablet, Take 81 mg by mouth daily., Disp: , Rfl:  .  Cholecalciferol (VITAMIN D) 2000 units tablet, Take 2,000 Units by mouth 3 (three) times a week. , Disp: , Rfl:  .  Cyanocobalamin (B-12) 2500 MCG TABS, Take 1 tablet by mouth 3 (three) times a week., Disp: , Rfl:  .  irbesartan (AVAPRO) 300 MG tablet, Take 300 mg by mouth daily., Disp: , Rfl:  .  metFORMIN (GLUCOPHAGE-XR) 500 MG 24 hr tablet, Take 1,000 mg by mouth daily. , Disp: , Rfl: 11 .  metoprolol tartrate (LOPRESSOR) 25 MG tablet, Take 1.5 tablets (37.5 mg total) by mouth 2 (two) times daily., Disp: 270 tablet, Rfl: 3 .  simvastatin (ZOCOR) 20 MG tablet, Take 20 mg by mouth daily., Disp: , Rfl:  .  triamcinolone cream (KENALOG) 0.1 %, Apply 1 application topically 2 (two) times daily as needed. Reported on 09/04/2015, Disp: , Rfl:   Past Medical History: Past Medical History:  Diagnosis Date  . BPH (benign prostatic hyperplasia)    [facility]  . Coronary artery disease    a. Myoview 4/17: EF 47%, anteroseptal, apical anterior, apical septal, apical scar with peri-infarct ischemia, inferoseptal, inferior, inferolateral, apical inferior infarct with peri-infarct ischemia, high risk // b. LHC 4/17: Proximal RCA 99%,  proximal LCx 100%, mid LAD 90%, distal LAD 60%, EF 55%  . Diabetes mellitus   . History of Doppler ultrasound    a. Carotid US 4/17:  bilateral ICA 1-39%  . History of echocardiogram    a. Echo 4/17:  EF 60-65%, normal wall motion, grade 1 diastolic dysfunction  . HLD (hyperlipidemia)   . Hypertension   . LBBB (left bundle branch block)   . Nephrolithiasis     Tobacco Use: History  Smoking Status  . Never Smoker  Smokeless Tobacco  . Never Used    Labs: Recent Review Flowsheet Data    Labs for ITP Cardiac and Pulmonary Rehab Latest Ref Rng & Units 08/13/2015 08/13/2015 08/13/2015 08/13/2015 08/13/2015   Hemoglobin A1c 4.8 - 5.6 % - - - - -   PHART 7.350 - 7.450 7.366 7.341(L) - 7.446 7.353   PCO2ART 35.0 - 45.0 mmHg 39.0 44.6 - 32.3(L) 41.2   HCO3 20.0 - 24.0 mEq/L 22.7 23.8 - 22.1 22.8   TCO2 0 - 100 mmol/L 24 25 22 23 24    ACIDBASEDEF 0.0 - 2.0 mmol/L 3.0(H) 2.0 - 1.0 2.0   O2SAT % 92.0 90.0 - 96.0 96.0      Capillary Blood Glucose: Lab Results  Component Value Date   GLUCAP 106 (H) 11/14/2015   GLUCAP 116 (H) 11/09/2015   GLUCAP 133 (H) 11/07/2015   GLUCAP 92 11/07/2015   GLUCAP 96 11/07/2015  Exercise Target Goals:    Exercise Program Goal: Individual exercise prescription set with THRR, safety & activity barriers. Participant demonstrates ability to understand and report RPE using BORG scale, to self-measure pulse accurately, and to acknowledge the importance of the exercise prescription.  Exercise Prescription Goal: Starting with aerobic activity 30 plus minutes a day, 3 days per week for initial exercise prescription. Provide home exercise prescription and guidelines that participant acknowledges understanding prior to discharge.  Activity Barriers & Risk Stratification:     Activity Barriers & Cardiac Risk Stratification - 10/18/15 0906      Activity Barriers & Cardiac Risk Stratification   Activity Barriers None   Cardiac Risk Stratification High       6 Minute Walk:     6 Minute Walk    Row Name 10/18/15 1533         6 Minute Walk   Phase Initial     Distance 1400 feet     Walk Time 6 minutes     # of Rest Breaks 0     MPH 2.65     METS 3     RPE 11     VO2 Peak 10.6     Symptoms No     Resting HR 62 bpm     Resting BP 140/88     Max Ex. HR 76 bpm     Max Ex. BP 164/80     2 Minute Post BP 144/84        Initial Exercise Prescription:     Initial Exercise Prescription - 10/18/15 1500      Date of Initial Exercise RX and Referring Provider   Date 10/18/15   Referring Provider Daneen Schick, MD     Bike   Level 0.5   Minutes 10   METs 2     NuStep   Level 3   Minutes 10   METs 2     Track   Laps 10   Minutes 10   METs 2.74     Prescription Details   Frequency (times per week) 2   Duration Progress to 30 minutes of continuous aerobic without signs/symptoms of physical distress     Intensity   THRR 40-80% of Max Heartrate 60-121   Ratings of Perceived Exertion 11-13   Perceived Dyspnea 0-4     Progression   Progression Continue to progress workloads to maintain intensity without signs/symptoms of physical distress.     Resistance Training   Training Prescription Yes   Weight 2lbs   Reps 10-12      Perform Capillary Blood Glucose checks as needed.  Exercise Prescription Changes:      Exercise Prescription Changes    Row Name 11/01/15 1700 11/16/15 1200 11/28/15 1600 12/13/15 1200 12/31/15 1700     Exercise Review   Progression Yes Yes Yes Yes Yes     Response to Exercise   Blood Pressure (Admit) 130/80 148/90 146/84 138/60 130/70   Blood Pressure (Exercise) 154/76 160/80 150/82 130/60 140/80   Blood Pressure (Exit) 130/80 134/82 132/90 122/62 102/57   Heart Rate (Admit) 62 bpm 72 bpm 53 bpm 60 bpm 55 bpm   Heart Rate (Exercise) 76 bpm 85 bpm 74 bpm 81 bpm 78 bpm   Heart Rate (Exit) 56 bpm 71 bpm 52 bpm 60 bpm 54 bpm   Rating of Perceived Exertion (Exercise) 11 12 11 11 11     Duration Progress to 30 minutes of continuous aerobic without signs/symptoms of  physical distress Progress to 30 minutes of continuous aerobic without signs/symptoms of physical distress Progress to 30 minutes of continuous aerobic without signs/symptoms of physical distress Progress to 30 minutes of continuous aerobic without signs/symptoms of physical distress Progress to 30 minutes of continuous aerobic without signs/symptoms of physical distress   Intensity THRR unchanged THRR unchanged THRR unchanged THRR unchanged THRR unchanged     Progression   Progression Continue to progress workloads to maintain intensity without signs/symptoms of physical distress. Continue to progress workloads to maintain intensity without signs/symptoms of physical distress. Continue to progress workloads to maintain intensity without signs/symptoms of physical distress. Continue to progress workloads to maintain intensity without signs/symptoms of physical distress. Continue to progress workloads to maintain intensity without signs/symptoms of physical distress.   Average METs 2.9 2.9 3.2 3.2 3.3     Resistance Training   Training Prescription Yes Yes Yes Yes Yes   Weight 1lb 5lbs 5lbs 5lbs 5lbs   Reps 10-12 10-12 10-12 10-12 10-12     Bike   Level 0.8 0.8 0.8 1.2 1.2   Minutes 10 10 10 10 10    METs 2.81 2.77 2.77 3.65 3.62     NuStep   Level 3 3 3 4 4    Minutes 10 10 10 10 10    METs 4.1 2.4 3.1 3.2 3.3     Track   Laps 13 9 13 9 13    Minutes 10 10 10 10 10    METs 3.26 2.57 3.26 2.57 3.26     Home Exercise Plan   Plans to continue exercise at  - Home  reviewed on 10/31/15 see progress note Home  reviewed on 10/31/15 see progress note Home  reviewed on 10/31/15 see progress note Home  reviewed on 10/31/15 see progress note   Frequency  - Add 2 additional days to program exercise sessions. Add 2 additional days to program exercise sessions. Add 2 additional days to program exercise sessions. Add 2  additional days to program exercise sessions.   [place] Name 01/28/16 1600             Exercise Review   Progression Yes         Response to Exercise   Blood Pressure (Admit) 116/60       Blood Pressure (Exercise) 120/60       Blood Pressure (Exit) 116/65       Heart Rate (Admit) 60 bpm       Heart Rate (Exercise) 97 bpm       Heart Rate (Exit) 60 bpm       Rating of Perceived Exertion (Exercise) 11       Duration Progress to 30 minutes of continuous aerobic without signs/symptoms of physical distress       Intensity THRR unchanged         Progression   Progression Continue to progress workloads to maintain intensity without signs/symptoms of physical distress.       Average METs 3.6         Resistance Training   Training Prescription Yes       Weight 5lbs       Reps 10-12         Bike   Level 1.8       Minutes 10       METs 4.89         NuStep   Level 4       Minutes 10       METs 3  Track   Laps 12       Minutes 10       METs 3.09         Home Exercise Plan   Plans to continue exercise at Home  reviewed on 10/31/15 see progress note       Frequency Add 2 additional days to program exercise sessions.          Exercise Comments:      Exercise Comments    Row Name 11/01/15 1702 11/30/15 1223 12/13/15 1241 12/31/15 1717 01/28/16 1635   Exercise Comments Reviewed METs and activity levels with pt. Pt is tolerating exercise well; will continue to monitor exercise progression Reviewed METs and activity levels with pt. Pt is tolerating exercise well; will continue to monitor exercise progression Reviewed METs and activity levels with pt. Pt is tolerating exercise well; will continue to monitor exercise progression Reviewed METs and goals with pt. Pt is tolerating exercise well; will continue to monitor exercise progression Reviewed METs and goals with pt. Pt is tolerating exercise well; will continue to monitor exercise progression      Discharge Exercise  Prescription (Final Exercise Prescription Changes):     Exercise Prescription Changes - 01/28/16 1600      Exercise Review   Progression Yes     Response to Exercise   Blood Pressure (Admit) 116/60   Blood Pressure (Exercise) 120/60   Blood Pressure (Exit) 116/65   Heart Rate (Admit) 60 bpm   Heart Rate (Exercise) 97 bpm   Heart Rate (Exit) 60 bpm   Rating of Perceived Exertion (Exercise) 11   Duration Progress to 30 minutes of continuous aerobic without signs/symptoms of physical distress   Intensity THRR unchanged     Progression   Progression Continue to progress workloads to maintain intensity without signs/symptoms of physical distress.   Average METs 3.6     Resistance Training   Training Prescription Yes   Weight 5lbs   Reps 10-12     Bike   Level 1.8   Minutes 10   METs 4.89     NuStep   Level 4   Minutes 10   METs 3     Track   Laps 12   Minutes 10   METs 3.09     Home Exercise Plan   Plans to continue exercise at Home  reviewed on 10/31/15 see progress note   Frequency Add 2 additional days to program exercise sessions.      Nutrition:  Target Goals: Understanding of nutrition guidelines, daily intake of sodium 1500mg , cholesterol 200mg , calories 30% from fat and 7% or less from saturated fats, daily to have 5 or more servings of fruits and vegetables.  Biometrics:     Pre Biometrics - 10/18/15 1532      Pre Biometrics   Waist Circumference 41.5 inches   Hip Circumference 42 inches   Waist to Hip Ratio 0.99 %   Triceps Skinfold 14 mm   % Body Fat 28.5 %   Grip Strength 44 kg   Flexibility 11 in   Single Leg Stand 19 seconds       Nutrition Therapy Plan and Nutrition Goals:   Nutrition Discharge: Nutrition Scores:     Nutrition Assessments - 11/21/15 1415      MEDFICTS Scores   Pre Score 33      Nutrition Goals Re-Evaluation:   Psychosocial: Target Goals: Acknowledge presence or absence of depression, maximize coping  skills, provide positive support system.  Participant is able to verbalize types and ability to use techniques and skills needed for reducing stress and depression.  Initial Review & Psychosocial Screening:     Initial Psych Review & Screening - 10/24/15 [place]? Yes     Barriers   Psychosocial barriers to participate in program The patient should benefit from training in stress management and relaxation.;There are no identifiable barriers or psychosocial needs.     Screening Interventions   Interventions Encouraged to exercise      Quality of Life Scores:     Quality of Life - 10/18/15 1338      Quality of Life Scores   Health/Function Pre 24.04 %   Socioeconomic Pre 25 %   Psych/Spiritual Pre 22.5 %   Family Pre 27 %   GLOBAL Pre 24.38 %      PHQ-9: Recent Review Flowsheet Data    Depression screen [facility] [place] 2/9 10/24/2015 08/30/2015   Decreased Interest 0 0   Down, Depressed, Hopeless 0 0   PHQ - 2 Score 0 0      Psychosocial Evaluation and Intervention:     Psychosocial Evaluation - 01/04/16 1426      Psychosocial Evaluation & Interventions   Comments pt health related anxiety has resolved since improved BP control.  pt expresses more confidence and comfort in his exercise routine. pt notes that he is able to do more with less effort and fatigue.    Continued Psychosocial Services Needed No      Psychosocial Re-Evaluation:     Psychosocial Re-Evaluation    [place] Name 11/02/15 1447 01/30/16 1108           Psychosocial Re-Evaluation   Interventions Stress management education;Relaxation education;Encouraged to attend Cardiac Rehabilitation for the exercise Encouraged to attend Cardiac Rehabilitation for the exercise      Comments pt  no psychosocial needs identified, no intervention necessary. pt exhibits significanlty decreased health related anxiety. pt reports he has DC warfarin per MD order, which has given him great  reassurance.pt feels his functional ability has increased as well as his self confidence in his physical well being.  pt feels comfortable completing rehab program soon.       Continued Psychosocial Services Needed Yes No         Vocational Rehabilitation: Provide vocational rehab assistance to qualifying candidates.   Vocational Rehab Evaluation & Intervention:     Vocational Rehab - 10/19/15 1440      Initial Vocational Rehab Evaluation & Intervention   Assessment shows need for Vocational Rehabilitation No      Education: Education Goals: Education classes will be provided on a weekly basis, covering required topics. Participant will state understanding/return demonstration of topics presented.  Learning Barriers/Preferences:     Learning Barriers/Preferences - 10/19/15 1441      Learning Barriers/Preferences   Learning Barriers Sight  reading glasses      Education Topics: Count Your Pulse:  -Group instruction provided by verbal instruction, demonstration, patient participation and written materials to support subject.  Instructors address importance of being able to find your pulse and how to count your pulse when at home without a heart monitor.  Patients get hands on experience counting their pulse with staff help and individually. Flowsheet Row CARDIAC REHAB PHASE II EXERCISE from 01/28/2016 in [place]  Date  10/26/15  Instruction Review Code  2- meets goals/outcomes  Heart Attack, Angina, and Risk Factor Modification:  -Group instruction provided by verbal instruction, video, and written materials to support subject.  Instructors address signs and symptoms of angina and heart attacks.    Also discuss risk factors for heart disease and how to make changes to improve heart health risk factors. Flowsheet Row CARDIAC REHAB PHASE II EXERCISE from 01/28/2016 in [place]  Date  12/12/15  Educator  RN   Instruction Review Code  2- meets goals/outcomes      Functional Fitness:  -Group instruction provided by verbal instruction, demonstration, patient participation, and written materials to support subject.  Instructors address safety measures for doing things around the house.  Discuss how to get up and down off the floor, how to pick things up properly, how to safely get out of a chair without assistance, and balance training. Flowsheet Row CARDIAC REHAB PHASE II EXERCISE from 01/28/2016 in [place]  Date  12/07/15  Instruction Review Code  2- meets goals/outcomes      Meditation and Mindfulness:  -Group instruction provided by verbal instruction, patient participation, and written materials to support subject.  Instructor addresses importance of mindfulness and meditation practice to help reduce stress and improve awareness.  Instructor also leads participants through a meditation exercise.  Flowsheet Row CARDIAC REHAB PHASE II EXERCISE from 01/28/2016 in [place]  Date  10/24/15  Instruction Review Code  2- meets goals/outcomes      Stretching for Flexibility and Mobility:  -Group instruction provided by verbal instruction, patient participation, and written materials to support subject.  Instructors lead participants through series of stretches that are designed to increase flexibility thus improving mobility.  These stretches are additional exercise for major muscle groups that are typically performed during regular warm up and cool down. Flowsheet Row CARDIAC REHAB PHASE II EXERCISE from 01/28/2016 in [place]  Date  01/18/16  Instruction Review Code  2- meets goals/outcomes      Hands Only CPR Anytime:  -Group instruction provided by verbal instruction, video, patient participation and written materials to support subject.  Instructors co-teach with AHA video for hands only CPR.   Participants get hands on experience with mannequins.   Nutrition I class: Heart Healthy Eating:  -Group instruction provided by PowerPoint slides, verbal discussion, and written materials to support subject matter. The instructor gives an explanation and review of the Therapeutic Lifestyle Changes diet recommendations, which includes a discussion on lipid goals, dietary fat, sodium, fiber, plant stanol/sterol esters, sugar, and the components of a well-balanced, healthy diet. Flowsheet Row CARDIAC REHAB PHASE II EXERCISE from 01/28/2016 in [place]  Date  11/21/15  Educator  RD  Instruction Review Code  Not applicable [class handout given]      Nutrition II class: Lifestyle Skills:  -Group instruction provided by PowerPoint slides, verbal discussion, and written materials to support subject matter. The instructor gives an explanation and review of label reading, grocery shopping for heart health, heart healthy recipe modifications, and ways to make healthier choices when eating out. Flowsheet Row CARDIAC REHAB PHASE II EXERCISE from 01/28/2016 in [place]  Date  01/29/16  Educator  RD  Instruction Review Code  2- meets goals/outcomes      Diabetes Question & Answer:  -Group instruction provided by PowerPoint slides, verbal discussion, and written materials to support subject matter. The instructor gives an  explanation and review of diabetes co-morbidities, pre- and post-prandial blood glucose goals, pre-exercise blood glucose goals, signs, symptoms, and treatment of hypoglycemia and hyperglycemia, and foot care basics. Flowsheet Row CARDIAC REHAB PHASE II EXERCISE from 01/28/2016 in [place]  Date  12/28/15  Educator  RD  Instruction Review Code  2- meets goals/outcomes      Diabetes Blitz:  -Group instruction provided by PowerPoint slides, verbal discussion, and written materials to  support subject matter. The instructor gives an explanation and review of the physiology behind type 1 and type 2 diabetes, diabetes medications and rational behind using different medications, pre- and post-prandial blood glucose recommendations and Hemoglobin A1c goals, diabetes diet, and exercise including blood glucose guidelines for exercising safely.  Flowsheet Row CARDIAC REHAB PHASE II EXERCISE from 01/28/2016 in [place]  Date  11/21/15  Educator  RD  Instruction Review Code  Not applicable [class handout given]      Portion Distortion:  -Group instruction provided by PowerPoint slides, verbal discussion, written materials, and food models to support subject matter. The instructor gives an explanation of serving size versus portion size, changes in portions sizes over the last 20 years, and what consists of a serving from each food group. Flowsheet Row CARDIAC REHAB PHASE II EXERCISE from 01/28/2016 in [place]  Date  11/28/15  Educator  RD  Instruction Review Code  2- meets goals/outcomes      Stress Management:  -Group instruction provided by verbal instruction, video, and written materials to support subject matter.  Instructors review role of stress in heart disease and how to cope with stress positively.   Flowsheet Row CARDIAC REHAB PHASE II EXERCISE from 01/28/2016 in [place]  Date  11/21/15  Instruction Review Code  2- meets goals/outcomes      Exercising on Your Own:  -Group instruction provided by verbal instruction, power point, and written materials to support subject.  Instructors discuss benefits of exercise, components of exercise, frequency and intensity of exercise, and end points for exercise.  Also discuss use of nitroglycerin and activating EMS.  Review options of places to exercise outside of rehab.  Review guidelines for sex with heart disease.   Cardiac  Drugs I:  -Group instruction provided by verbal instruction and written materials to support subject.  Instructor reviews cardiac drug classes: antiplatelets, anticoagulants, beta blockers, and statins.  Instructor discusses reasons, side effects, and lifestyle considerations for each drug class. Flowsheet Row CARDIAC REHAB PHASE II EXERCISE from 01/28/2016 in [place]  Date  12/05/15  Educator  pharmD  Instruction Review Code  2- meets goals/outcomes      Cardiac Drugs II:  -Group instruction provided by verbal instruction and written materials to support subject.  Instructor reviews cardiac drug classes: angiotensin converting enzyme inhibitors (ACE-I), angiotensin II receptor blockers (ARBs), nitrates, and calcium channel blockers.  Instructor discusses reasons, side effects, and lifestyle considerations for each drug class. Flowsheet Row CARDIAC REHAB PHASE II EXERCISE from 01/28/2016 in [place]  Date  01/02/16  Instruction Review Code  2- meets goals/outcomes      Anatomy and Physiology of the Circulatory System:  -Group instruction provided by verbal instruction, video, and written materials to support subject.  Reviews functional anatomy of heart, how it relates to various diagnoses, and what role the heart plays in the overall system. [place]  REHAB PHASE II EXERCISE from 01/28/2016 in [place]  Date  12/26/15  Instruction Review Code  2- meets goals/outcomes      Knowledge Questionnaire Score:     Knowledge Questionnaire Score - 10/18/15 1337      Knowledge Questionnaire Score   Pre Score 20/24      Core Components/Risk Factors/Patient Goals at Admission:     Personal Goals and Risk Factors at Admission - 10/18/15 0907      Core Components/Risk Factors/Patient Goals on Admission    Weight Management Yes;Weight Loss   Intervention Weight Management:  Develop a combined nutrition and exercise program designed to reach desired caloric intake, while maintaining appropriate intake of nutrient and fiber, sodium and fats, and appropriate energy expenditure required for the weight goal.;Weight Management: Provide education and appropriate resources to help participant work on and attain dietary goals.;Weight Management/Obesity: Establish reasonable short term and long term weight goals.   Expected Outcomes Short Term: Continue to assess and modify interventions until short term weight is achieved;Weight Maintenance: Understanding of the daily nutrition guidelines, which includes 25-35% calories from fat, 7% or less cal from saturated fats, less than 200mg  cholesterol, less than 1.5gm of sodium, & 5 or more servings of fruits and vegetables daily;Weight Loss: Understanding of general recommendations for a balanced deficit meal plan, which promotes 1-2 lb weight loss per week and includes a negative energy balance of [phone] kcal/d;Long Term: Adherence to nutrition and physical activity/exercise program aimed toward attainment of established weight goal;Understanding recommendations for meals to include 15-35% energy as protein, 25-35% energy from fat, 35-60% energy from carbohydrates, less than 200mg  of dietary cholesterol, 20-35 gm of total fiber daily;Understanding of distribution of calorie intake throughout the day with the consumption of 4-5 meals/snacks   Diabetes Yes   Intervention Provide education about proper nutrition, including hydration, and aerobic/resistive exercise prescription along with prescribed medications to achieve blood glucose in normal ranges: Fasting glucose 65-99 mg/dL;Provide education about signs/symptoms and action to take for hypo/hyperglycemia.   Expected Outcomes Short Term: Participant verbalizes understanding of the signs/symptoms and immediate care of hyper/hypoglycemia, proper foot care and importance of medication,  aerobic/resistive exercise and nutrition plan for blood glucose control.;Long Term: Attainment of HbA1C < 7%.   Hypertension Yes   Intervention Provide education on lifestyle modifcations including regular physical activity/exercise, weight management, moderate sodium restriction and increased consumption of fresh fruit, vegetables, and low fat dairy, alcohol moderation, and smoking cessation.;Monitor prescription use compliance.   Expected Outcomes Short Term: Continued assessment and intervention until BP is < 140/35mm HG in hypertensive participants. < 130/7mm HG in hypertensive participants with diabetes, heart failure or chronic kidney disease.;Long Term: Maintenance of blood pressure at goal levels.   Lipids Yes   Intervention Provide education and support for participant on nutrition & aerobic/resistive exercise along with prescribed medications to achieve LDL 70mg , HDL >40mg .   Expected Outcomes Short Term: Participant states understanding of desired cholesterol values and is compliant with medications prescribed. Participant is following exercise prescription and nutrition guidelines.;Long Term: Cholesterol controlled with medications as prescribed, with individualized exercise RX and with personalized nutrition plan. Value goals: LDL < 70mg , HDL > 40 mg.   Personal Goal Other Yes   Personal Goal short : Be better than before surgery-long: Be more active   Intervention Provide exercise and nutrition guidelines to assist with increased activity levels and weigthloss   Expected Outcomes Be able to lose 5-10lbs and increased exercise capacity  Core Components/Risk Factors/Patient Goals Review:      Goals and Risk Factor Review    Row Name 11/30/15 1449 12/31/15 1717 01/04/16 1426 01/30/16 1647       Core Components/Risk Factors/Patient Goals Review   Personal Goals Review Other  -  - Other;Increase Strength and Stamina    Review Pt feels great and is exercising on off days. Pt is  up to 28milles/day walking  Pt is very complaint with HEP and has noticed an increase in energy levels and able to do yardwork without fatigue Pt is very complaint with HEP and has noticed an increase in energy levels and able to do yardwork without fatigue Pt has noticed an increase in strength and stamina since d/c of Warfarin. Pt is walking 30 min, at 2/xweek in addition to coming to [facility].    Expected Outcomes Pt will continue exercising without any complications Pt will continue to perform ADL's and chores with fatigue or extreme exhaustion Pt will continue to perform ADL's and chores without  fatigue or extreme exhaustion Pt will continue to exercise at home without symptoms and increase overall exercise capacity.       Core Components/Risk Factors/Patient Goals at Discharge (Final Review):      Goals and Risk Factor Review - 01/30/16 1647      Core Components/Risk Factors/Patient Goals Review   Personal Goals Review Other;Increase Strength and Stamina   Review Pt has noticed an increase in strength and stamina since d/c of Warfarin. Pt is walking 30 min, at 2/xweek in addition to coming to [facility].   Expected Outcomes Pt will continue to exercise at home without symptoms and increase overall exercise capacity.      ITP Comments:     ITP Comments    Row Name 10/18/15 0905 01/25/16 1443         ITP Comments Dr. Fransico Him, Medical Director attended Hypertension education lecture         Comments: Pt is making expected progress toward personal goals after completing 24 sessions. Recommend continued exercise and life style modification education including  stress management and relaxation techniques to decrease cardiac risk profile.

## 2016-02-01 ENCOUNTER — Encounter (HOSPITAL_COMMUNITY)
Admission: RE | Admit: 2016-02-01 | Discharge: 2016-02-01 | Disposition: A | Discharge status: Home / Self Care | Payer: PPO | Source: Ambulatory Visit | Attending: Interventional Cardiology | Admitting: Interventional Cardiology

## 2016-02-01 DIAGNOSIS — Z951 Presence of aortocoronary bypass graft: Secondary | ICD-10-CM | POA: Diagnosis not present

## 2016-02-04 ENCOUNTER — Encounter (HOSPITAL_COMMUNITY): Payer: PPO

## 2016-02-04 ENCOUNTER — Encounter (HOSPITAL_COMMUNITY)
Admission: RE | Admit: 2016-02-04 | Discharge: 2016-02-04 | Disposition: A | Discharge status: Home / Self Care | Payer: PPO | Source: Ambulatory Visit | Attending: Interventional Cardiology | Admitting: Interventional Cardiology

## 2016-02-04 DIAGNOSIS — Z951 Presence of aortocoronary bypass graft: Secondary | ICD-10-CM | POA: Diagnosis not present

## 2016-02-06 ENCOUNTER — Encounter (HOSPITAL_COMMUNITY)
Admission: RE | Admit: 2016-02-06 | Discharge: 2016-02-06 | Disposition: A | Discharge status: Home / Self Care | Payer: PPO | Source: Ambulatory Visit | Attending: Interventional Cardiology | Admitting: Interventional Cardiology

## 2016-02-06 VITALS — Wt 192.2 lb

## 2016-02-06 DIAGNOSIS — Z951 Presence of aortocoronary bypass graft: Secondary | ICD-10-CM

## 2016-02-07 MED FILL — METOPROLOL TARTRATE 25 MG T: 25 | 30 days supply | Qty: 90 | Fill #5

## 2016-02-07 MED FILL — IRBESARTAN 300 MG TABLET: 300 | 30 days supply | Qty: 30 | Fill #5

## 2016-02-08 ENCOUNTER — Encounter (HOSPITAL_COMMUNITY)
Admission: RE | Admit: 2016-02-08 | Discharge: 2016-02-08 | Disposition: A | Discharge status: Home / Self Care | Payer: PPO | Source: Ambulatory Visit | Attending: Interventional Cardiology | Admitting: Interventional Cardiology

## 2016-02-08 ENCOUNTER — Encounter (HOSPITAL_COMMUNITY): Payer: Self-pay

## 2016-02-08 DIAGNOSIS — Z951 Presence of aortocoronary bypass graft: Secondary | ICD-10-CM

## 2016-02-08 NOTE — Progress Notes (Signed)
Cardiac Individual Treatment Plan  Patient Details  Name: [name] [name] MRN: [id number] Date of Birth: 1945/09/19 Referring Provider:   Flowsheet Row CARDIAC REHAB PHASE II ORIENTATION from 10/18/2015 in [place]  Referring Provider  Daneen Schick, MD      Initial Encounter Date:  [place] PHASE II ORIENTATION from 10/18/2015 in [place]  Date  10/18/15  Referring Provider  Daneen Schick, MD      Visit Diagnosis: No diagnosis found.  Patient's Home Medications on Admission:  Current Outpatient Prescriptions:  .  amLODipine (NORVASC) 10 MG tablet, Take 1 tablet (10 mg total) by mouth daily., Disp: 90 tablet, Rfl: 3 .  aspirin 81 MG tablet, Take 81 mg by mouth daily., Disp: , Rfl:  .  Cholecalciferol (VITAMIN D) 2000 units tablet, Take 2,000 Units by mouth 3 (three) times a week. , Disp: , Rfl:  .  Cyanocobalamin (B-12) 2500 MCG TABS, Take 1 tablet by mouth 3 (three) times a week., Disp: , Rfl:  .  irbesartan (AVAPRO) 300 MG tablet, Take 300 mg by mouth daily., Disp: , Rfl:  .  metFORMIN (GLUCOPHAGE-XR) 500 MG 24 hr tablet, Take 1,000 mg by mouth daily. , Disp: , Rfl: 11 .  metoprolol tartrate (LOPRESSOR) 25 MG tablet, Take 1.5 tablets (37.5 mg total) by mouth 2 (two) times daily., Disp: 270 tablet, Rfl: 3 .  simvastatin (ZOCOR) 20 MG tablet, Take 20 mg by mouth daily., Disp: , Rfl:  .  triamcinolone cream (KENALOG) 0.1 %, Apply 1 application topically 2 (two) times daily as needed. Reported on 09/04/2015, Disp: , Rfl:   Past Medical History: Past Medical History:  Diagnosis Date  . BPH (benign prostatic hyperplasia)    [facility]  . Coronary artery disease    a. Myoview 4/17: EF 47%, anteroseptal, apical anterior, apical septal, apical scar with peri-infarct ischemia, inferoseptal, inferior, inferolateral, apical inferior infarct with peri-infarct ischemia, high risk // b. LHC 4/17: Proximal  RCA 99%, proximal LCx 100%, mid LAD 90%, distal LAD 60%, EF 55%  . Diabetes mellitus   . History of Doppler ultrasound    a. Carotid US 4/17:  bilateral ICA 1-39%  . History of echocardiogram    a. Echo 4/17:  EF 60-65%, normal wall motion, grade 1 diastolic dysfunction  . HLD (hyperlipidemia)   . Hypertension   . LBBB (left bundle branch block)   . Nephrolithiasis     Tobacco Use: History  Smoking Status  . Never Smoker  Smokeless Tobacco  . Never Used    Labs: Recent Review Flowsheet Data    Labs for ITP Cardiac and Pulmonary Rehab Latest Ref Rng & Units 08/13/2015 08/13/2015 08/13/2015 08/13/2015 08/13/2015   Hemoglobin A1c 4.8 - 5.6 % - - - - -   PHART 7.350 - 7.450 7.366 7.341(L) - 7.446 7.353   PCO2ART 35.0 - 45.0 mmHg 39.0 44.6 - 32.3(L) 41.2   HCO3 20.0 - 24.0 mEq/L 22.7 23.8 - 22.1 22.8   TCO2 0 - 100 mmol/L _0 ACIDBASEDEF 0.0 - 2.0 mmol/L 3.0(H) 2.0 - 1.0 2.0   O2SAT % 92.0 90.0 - 96.0 96.0      Capillary Blood Glucose: Lab Results  Component Value Date   GLUCAP 106 (H) 11/14/2015   GLUCAP 116 (H) 11/09/2015   GLUCAP 133 (H) 11/07/2015   GLUCAP 92 11/07/2015   GLUCAP 96 11/07/2015     Exercise  Target Goals:    Exercise Program Goal: Individual exercise prescription set with THRR, safety & activity barriers. Participant demonstrates ability to understand and report RPE using BORG scale, to self-measure pulse accurately, and to acknowledge the importance of the exercise prescription.  Exercise Prescription Goal: Starting with aerobic activity 30 plus minutes a day, 3 days per week for initial exercise prescription. Provide home exercise prescription and guidelines that participant acknowledges understanding prior to discharge.  Activity Barriers & Risk Stratification:     Activity Barriers & Cardiac Risk Stratification - 10/18/15 0906      Activity Barriers & Cardiac Risk Stratification   Activity Barriers None   Cardiac Risk Stratification  High      6 Minute Walk:     6 Minute Walk    Row Name 10/18/15 1533         6 Minute Walk   Phase Initial     Distance 1400 feet     Walk Time 6 minutes     # of Rest Breaks 0     MPH 2.65     METS 3     RPE 11     VO2 Peak 10.6     Symptoms No     Resting HR 62 bpm     Resting BP 140/88     Max Ex. HR 76 bpm     Max Ex. BP 164/80     2 Minute Post BP 144/84        Initial Exercise Prescription:     Initial Exercise Prescription - 10/18/15 1500      Date of Initial Exercise RX and Referring Provider   Date 10/18/15   Referring Provider Daneen Schick, MD     Bike   Level 0.5   Minutes 10   METs 2     NuStep   Level 3   Minutes 10   METs 2     Track   Laps 10   Minutes 10   METs 2.74     Prescription Details   Frequency (times per week) 2   Duration Progress to 30 minutes of continuous aerobic without signs/symptoms of physical distress     Intensity   THRR 40-80% of Max Heartrate 60-121   Ratings of Perceived Exertion 11-13   Perceived Dyspnea 0-4     Progression   Progression Continue to progress workloads to maintain intensity without signs/symptoms of physical distress.     Resistance Training   Training Prescription Yes   Weight 2lbs   Reps 10-12      Perform Capillary Blood Glucose checks as needed.  Exercise Prescription Changes:     Exercise Prescription Changes    Row Name 11/01/15 1700 11/16/15 1200 11/28/15 1600 12/13/15 1200 12/31/15 1700     Exercise Review   Progression _0      Response to Exercise   Blood Pressure (Admit) 130/80 148/90 146/84 138/60 130/70   Blood Pressure (Exercise) 154/76 160/80 150/82 130/60 140/80   Blood Pressure (Exit) 130/80 134/82 132/90 122/62 102/57   Heart Rate (Admit) 62 bpm 72 bpm 53 bpm 60 bpm 55 bpm   Heart Rate (Exercise) 76 bpm 85 bpm 74 bpm 81 bpm 78 bpm   Heart Rate (Exit) 56 bpm 71 bpm 52 bpm 60 bpm 54 bpm   Rating of Perceived Exertion (Exercise) _1 Duration Progress to 30 minutes of continuous aerobic without signs/symptoms of physical distress  Progress to 30 minutes of continuous aerobic without signs/symptoms of physical distress Progress to 30 minutes of continuous aerobic without signs/symptoms of physical distress Progress to 30 minutes of continuous aerobic without signs/symptoms of physical distress Progress to 30 minutes of continuous aerobic without signs/symptoms of physical distress   Intensity _0      Progression   Progression Continue to progress workloads to maintain intensity without signs/symptoms of physical distress. Continue to progress workloads to maintain intensity without signs/symptoms of physical distress. Continue to progress workloads to maintain intensity without signs/symptoms of physical distress. Continue to progress workloads to maintain intensity without signs/symptoms of physical distress. Continue to progress workloads to maintain intensity without signs/symptoms of physical distress.   Average METs 2.9 2.9 3.2 3.2 3.3     Resistance Training   Training Prescription _1    Weight 1lb 5lbs 5lbs 5lbs 5lbs   Reps 10-12 10-12 10-12 10-12 10-12     Bike   Level 0.8 0.8 0.8 1.2 1.2   Minutes _2 METs 2.81 2.77 2.77 3.65 3.62     NuStep   Level _3 Minutes _4 METs 4.1 2.4 3.1 3.2 3.3     Track   Laps _5 Minutes _6 METs 3.26 2.57 3.26 2.57 3.26     Home Exercise Plan   Plans to continue exercise at  - Home  reviewed on 10/31/15 see progress note Home  reviewed on 10/31/15 see progress note Home  reviewed on 10/31/15 see progress note Home  reviewed on 10/31/15 see progress note   Frequency  - Add 2 additional days to program exercise sessions. Add 2 additional days to program exercise sessions. Add 2 additional days to program exercise sessions. Add 2  additional days to program exercise sessions.   [place] Name 01/28/16 1600             Exercise Review   Progression Yes         Response to Exercise   Blood Pressure (Admit) 116/60       Blood Pressure (Exercise) 120/60       Blood Pressure (Exit) 116/65       Heart Rate (Admit) 60 bpm       Heart Rate (Exercise) 97 bpm       Heart Rate (Exit) 60 bpm       Rating of Perceived Exertion (Exercise) 11       Duration Progress to 30 minutes of continuous aerobic without signs/symptoms of physical distress       Intensity THRR unchanged         Progression   Progression Continue to progress workloads to maintain intensity without signs/symptoms of physical distress.       Average METs 3.6         Resistance Training   Training Prescription Yes       Weight 5lbs       Reps 10-12         Bike   Level 1.8       Minutes 10       METs 4.89         NuStep   Level 4       Minutes 10       METs 3  Track   Laps 12       Minutes 10       METs 3.09         Home Exercise Plan   Plans to continue exercise at Home  reviewed on 10/31/15 see progress note       Frequency Add 2 additional days to program exercise sessions.          Exercise Comments:     Exercise Comments    Row Name 11/01/15 1702 11/30/15 1223 12/13/15 1241 12/31/15 1717 01/28/16 1635   Exercise Comments Reviewed METs and activity levels with pt. Pt is tolerating exercise well; will continue to monitor exercise progression Reviewed METs and activity levels with pt. Pt is tolerating exercise well; will continue to monitor exercise progression Reviewed METs and activity levels with pt. Pt is tolerating exercise well; will continue to monitor exercise progression Reviewed METs and goals with pt. Pt is tolerating exercise well; will continue to monitor exercise progression Reviewed METs and goals with pt. Pt is tolerating exercise well; will continue to monitor exercise progression      Discharge Exercise  Prescription (Final Exercise Prescription Changes):     Exercise Prescription Changes - 01/28/16 1600      Exercise Review   Progression Yes     Response to Exercise   Blood Pressure (Admit) 116/60   Blood Pressure (Exercise) 120/60   Blood Pressure (Exit) 116/65   Heart Rate (Admit) 60 bpm   Heart Rate (Exercise) 97 bpm   Heart Rate (Exit) 60 bpm   Rating of Perceived Exertion (Exercise) 11   Duration Progress to 30 minutes of continuous aerobic without signs/symptoms of physical distress   Intensity THRR unchanged     Progression   Progression Continue to progress workloads to maintain intensity without signs/symptoms of physical distress.   Average METs 3.6     Resistance Training   Training Prescription Yes   Weight 5lbs   Reps 10-12     Bike   Level 1.8   Minutes 10   METs 4.89     NuStep   Level 4   Minutes 10   METs 3     Track   Laps 12   Minutes 10   METs 3.09     Home Exercise Plan   Plans to continue exercise at Home  reviewed on 10/31/15 see progress note   Frequency Add 2 additional days to program exercise sessions.      Nutrition:  Target Goals: Understanding of nutrition guidelines, daily intake of sodium <1553m, cholesterol <2044m calories 30% from fat and 7% or less from saturated fats, daily to have 5 or more servings of fruits and vegetables.  Biometrics:     Pre Biometrics - 10/18/15 1532      Pre Biometrics   Waist Circumference 41.5 inches   Hip Circumference 42 inches   Waist to Hip Ratio 0.99 %   Triceps Skinfold 14 mm   % Body Fat 28.5 %   Grip Strength 44 kg   Flexibility 11 in   Single Leg Stand 19 seconds       Nutrition Therapy Plan and Nutrition Goals:   Nutrition Discharge: Nutrition Scores:     Nutrition Assessments - 11/21/15 1415      MEDFICTS Scores   Pre Score 33      Nutrition Goals Re-Evaluation:   Psychosocial: Target Goals: Acknowledge presence or absence of depression, maximize coping  skills, provide positive support system. Participant  is able to verbalize types and ability to use techniques and skills needed for reducing stress and depression.  Initial Review & Psychosocial Screening:     Initial Psych Review & Screening - 10/24/15 [place]? Yes     Barriers   Psychosocial barriers to participate in program The patient should benefit from training in stress management and relaxation.;There are no identifiable barriers or psychosocial needs.     Screening Interventions   Interventions Encouraged to exercise      Quality of Life Scores:     Quality of Life - 02/08/16 1325      Quality of Life Scores   Health/Function Pre 24.04 %   Health/Function Post 27.23 %   Health/Function % Change 13.27 %   Socioeconomic Pre 25 %   Socioeconomic Post 24.86 %   Socioeconomic % Change  -0.56 %   Psych/Spiritual Pre 22.5 %   Psych/Spiritual Post 28.79 %   Psych/Spiritual % Change 27.96 %   Family Pre 27 %   Family Post 27.6 %   Family % Change 2.22 %   GLOBAL Pre 24.38 %   GLOBAL Post 27.12 %   GLOBAL % Change 11.24 %      PHQ-9: Recent Review Flowsheet Data    Depression screen [facility] 2/9 02/08/2016 10/24/2015 08/30/2015   Decreased Interest 0 0 0   Down, Depressed, Hopeless 0 0 0   PHQ - 2 Score 0 0 0      Psychosocial Evaluation and Intervention:     Psychosocial Evaluation - 02/08/16 1445      Discharge Psychosocial Assessment & Intervention   Discharge Continue support measures as needed   Comments no psychosocial needs identified at this time, no interventions necessary. pt health related anxiety has greatly improved.      Psychosocial Re-Evaluation:     Psychosocial Re-Evaluation    [place] Name 11/02/15 1447 01/30/16 1108           Psychosocial Re-Evaluation   Interventions Stress management education;Relaxation education;Encouraged to attend Cardiac Rehabilitation for the exercise Encouraged to attend  Cardiac Rehabilitation for the exercise      Comments pt  no psychosocial needs identified, no intervention necessary. pt exhibits significanlty decreased health related anxiety. pt reports he has DC warfarin per MD order, which has given him great reassurance.pt feels his functional ability has increased as well as his self confidence in his physical well being.  pt feels comfortable completing rehab program soon.       Continued Psychosocial Services Needed Yes No         Vocational Rehabilitation: Provide vocational rehab assistance to qualifying candidates.   Vocational Rehab Evaluation & Intervention:     Vocational Rehab - 10/19/15 1440      Initial Vocational Rehab Evaluation & Intervention   Assessment shows need for Vocational Rehabilitation No      Education: Education Goals: Education classes will be provided on a weekly basis, covering required topics. Participant will state understanding/return demonstration of topics presented.  Learning Barriers/Preferences:     Learning Barriers/Preferences - 10/19/15 1441      Learning Barriers/Preferences   Learning Barriers Sight  reading glasses      Education Topics: Count Your Pulse:  -Group instruction provided by verbal instruction, demonstration, patient participation and written materials to support subject.  Instructors address importance of being able to find your pulse and how to count your pulse when at  home without a heart monitor.  Patients get hands on experience counting their pulse with staff help and individually. Flowsheet Row CARDIAC REHAB PHASE II EXERCISE from 01/28/2016 in [place]  Date  10/26/15  Instruction Review Code  2- meets goals/outcomes      Heart Attack, Angina, and Risk Factor Modification:  -Group instruction provided by verbal instruction, video, and written materials to support subject.  Instructors address signs and symptoms of angina and heart  attacks.    Also discuss risk factors for heart disease and how to make changes to improve heart health risk factors. Flowsheet Row CARDIAC REHAB PHASE II EXERCISE from 01/28/2016 in [place]  Date  12/12/15  Educator  RN  Instruction Review Code  2- meets goals/outcomes      Functional Fitness:  -Group instruction provided by verbal instruction, demonstration, patient participation, and written materials to support subject.  Instructors address safety measures for doing things around the house.  Discuss how to get up and down off the floor, how to pick things up properly, how to safely get out of a chair without assistance, and balance training. Flowsheet Row CARDIAC REHAB PHASE II EXERCISE from 01/28/2016 in [place]  Date  12/07/15  Instruction Review Code  2- meets goals/outcomes      Meditation and Mindfulness:  -Group instruction provided by verbal instruction, patient participation, and written materials to support subject.  Instructor addresses importance of mindfulness and meditation practice to help reduce stress and improve awareness.  Instructor also leads participants through a meditation exercise.  Flowsheet Row CARDIAC REHAB PHASE II EXERCISE from 01/28/2016 in [place]  Date  10/24/15  Instruction Review Code  2- meets goals/outcomes      Stretching for Flexibility and Mobility:  -Group instruction provided by verbal instruction, patient participation, and written materials to support subject.  Instructors lead participants through series of stretches that are designed to increase flexibility thus improving mobility.  These stretches are additional exercise for major muscle groups that are typically performed during regular warm up and cool down. Flowsheet Row CARDIAC REHAB PHASE II EXERCISE from 01/28/2016 in [place]  Date  01/18/16   Instruction Review Code  2- meets goals/outcomes      Hands Only CPR Anytime:  -Group instruction provided by verbal instruction, video, patient participation and written materials to support subject.  Instructors co-teach with AHA video for hands only CPR.  Participants get hands on experience with mannequins.   Nutrition I class: Heart Healthy Eating:  -Group instruction provided by PowerPoint slides, verbal discussion, and written materials to support subject matter. The instructor gives an explanation and review of the Therapeutic Lifestyle Changes diet recommendations, which includes a discussion on lipid goals, dietary fat, sodium, fiber, plant stanol/sterol esters, sugar, and the components of a well-balanced, healthy diet. Flowsheet Row CARDIAC REHAB PHASE II EXERCISE from 01/28/2016 in [place]  Date  11/21/15  Educator  RD  Instruction Review Code  Not applicable [class handout given]      Nutrition II class: Lifestyle Skills:  -Group instruction provided by PowerPoint slides, verbal discussion, and written materials to support subject matter. The instructor gives an explanation and review of label reading, grocery shopping for heart health, heart healthy recipe modifications, and ways to make healthier choices when eating out. Flowsheet Row CARDIAC REHAB PHASE II EXERCISE from 01/28/2016  in [place]  Date  01/29/16  Educator  RD  Instruction Review Code  2- meets goals/outcomes      Diabetes Question & Answer:  -Group instruction provided by PowerPoint slides, verbal discussion, and written materials to support subject matter. The instructor gives an explanation and review of diabetes co-morbidities, pre- and post-prandial blood glucose goals, pre-exercise blood glucose goals, signs, symptoms, and treatment of hypoglycemia and hyperglycemia, and foot care basics. Flowsheet Row CARDIAC REHAB PHASE II EXERCISE  from 01/28/2016 in [place]  Date  12/28/15  Educator  RD  Instruction Review Code  2- meets goals/outcomes      Diabetes Blitz:  -Group instruction provided by PowerPoint slides, verbal discussion, and written materials to support subject matter. The instructor gives an explanation and review of the physiology behind type 1 and type 2 diabetes, diabetes medications and rational behind using different medications, pre- and post-prandial blood glucose recommendations and Hemoglobin A1c goals, diabetes diet, and exercise including blood glucose guidelines for exercising safely.  Flowsheet Row CARDIAC REHAB PHASE II EXERCISE from 01/28/2016 in [place]  Date  11/21/15  Educator  RD  Instruction Review Code  Not applicable [class handout given]      Portion Distortion:  -Group instruction provided by PowerPoint slides, verbal discussion, written materials, and food models to support subject matter. The instructor gives an explanation of serving size versus portion size, changes in portions sizes over the last 20 years, and what consists of a serving from each food group. Flowsheet Row CARDIAC REHAB PHASE II EXERCISE from 01/28/2016 in [place]  Date  11/28/15  Educator  RD  Instruction Review Code  2- meets goals/outcomes      Stress Management:  -Group instruction provided by verbal instruction, video, and written materials to support subject matter.  Instructors review role of stress in heart disease and how to cope with stress positively.   Flowsheet Row CARDIAC REHAB PHASE II EXERCISE from 01/28/2016 in [place]  Date  11/21/15  Instruction Review Code  2- meets goals/outcomes      Exercising on Your Own:  -Group instruction provided by verbal instruction, power point, and written materials to support subject.  Instructors discuss benefits of exercise,  components of exercise, frequency and intensity of exercise, and end points for exercise.  Also discuss use of nitroglycerin and activating EMS.  Review options of places to exercise outside of rehab.  Review guidelines for sex with heart disease.   Cardiac Drugs I:  -Group instruction provided by verbal instruction and written materials to support subject.  Instructor reviews cardiac drug classes: antiplatelets, anticoagulants, beta blockers, and statins.  Instructor discusses reasons, side effects, and lifestyle considerations for each drug class. Flowsheet Row CARDIAC REHAB PHASE II EXERCISE from 01/28/2016 in [place]  Date  12/05/15  Educator  pharmD  Instruction Review Code  2- meets goals/outcomes      Cardiac Drugs II:  -Group instruction provided by verbal instruction and written materials to support subject.  Instructor reviews cardiac drug classes: angiotensin converting enzyme inhibitors (ACE-I), angiotensin II receptor blockers (ARBs), nitrates, and calcium channel blockers.  Instructor discusses reasons, side effects, and lifestyle considerations for each drug class. Flowsheet Row CARDIAC REHAB PHASE II EXERCISE from 01/28/2016 in [place]  Date  01/02/16  Instruction Review Code  2- meets  goals/outcomes      Anatomy and Physiology of the Circulatory System:  -Group instruction provided by verbal instruction, video, and written materials to support subject.  Reviews functional anatomy of heart, how it relates to various diagnoses, and what role the heart plays in the overall system. [place] PHASE II EXERCISE from 01/28/2016 in [place]  Date  12/26/15  Instruction Review Code  2- meets goals/outcomes      Knowledge Questionnaire Score:     Knowledge Questionnaire Score - 02/08/16 1323      Knowledge Questionnaire Score   Post Score 22/24      Core  Components/Risk Factors/Patient Goals at Admission:     Personal Goals and Risk Factors at Admission - 10/18/15 0907      Core Components/Risk Factors/Patient Goals on Admission    Weight Management Yes;Weight Loss   Intervention Weight Management: Develop a combined nutrition and exercise program designed to reach desired caloric intake, while maintaining appropriate intake of nutrient and fiber, sodium and fats, and appropriate energy expenditure required for the weight goal.;Weight Management: Provide education and appropriate resources to help participant work on and attain dietary goals.;Weight Management/Obesity: Establish reasonable short term and long term weight goals.   Expected Outcomes Short Term: Continue to assess and modify interventions until short term weight is achieved;Weight Maintenance: Understanding of the daily nutrition guidelines, which includes 25-35% calories from fat, 7% or less cal from saturated fats, less than 238m cholesterol, less than 1.5gm of sodium, & 5 or more servings of fruits and vegetables daily;Weight Loss: Understanding of general recommendations for a balanced deficit meal plan, which promotes 1-2 lb weight loss per week and includes a negative energy balance of [phone] kcal/d;Long Term: Adherence to nutrition and physical activity/exercise program aimed toward attainment of established weight goal;Understanding recommendations for meals to include 15-35% energy as protein, 25-35% energy from fat, 35-60% energy from carbohydrates, less than 2020mof dietary cholesterol, 20-35 gm of total fiber daily;Understanding of distribution of calorie intake throughout the day with the consumption of 4-5 meals/snacks   Diabetes Yes   Intervention Provide education about proper nutrition, including hydration, and aerobic/resistive exercise prescription along with prescribed medications to achieve blood glucose in normal ranges: Fasting glucose 65-99 mg/dL;Provide education  about signs/symptoms and action to take for hypo/hyperglycemia.   Expected Outcomes Short Term: Participant verbalizes understanding of the signs/symptoms and immediate care of hyper/hypoglycemia, proper foot care and importance of medication, aerobic/resistive exercise and nutrition plan for blood glucose control.;Long Term: Attainment of HbA1C < 7%.   Hypertension Yes   Intervention Provide education on lifestyle modifcations including regular physical activity/exercise, weight management, moderate sodium restriction and increased consumption of fresh fruit, vegetables, and low fat dairy, alcohol moderation, and smoking cessation.;Monitor prescription use compliance.   Expected Outcomes Short Term: Continued assessment and intervention until BP is < 140/9078mG in hypertensive participants. < 130/52m64m in hypertensive participants with diabetes, heart failure or chronic kidney disease.;Long Term: Maintenance of blood pressure at goal levels.   Lipids Yes   Intervention Provide education and support for participant on nutrition & aerobic/resistive exercise along with prescribed medications to achieve LDL <70mg59mL >40mg.41mxpected Outcomes Short Term: Participant states understanding of desired cholesterol values and is compliant with medications prescribed. Participant is following exercise prescription and nutrition guidelines.;Long Term: Cholesterol controlled with medications as prescribed, with individualized exercise RX and with personalized nutrition plan. Value goals: LDL < 70mg, 75m> 40  mg.   Personal Goal Other Yes   Personal Goal short : Be better than before surgery-long: Be more active   Intervention Provide exercise and nutrition guidelines to assist with increased activity levels and weigthloss   Expected Outcomes Be able to lose 5-10lbs and increased exercise capacity      Core Components/Risk Factors/Patient Goals Review:      Goals and Risk Factor Review    Row Name  11/30/15 1449 12/31/15 1717 01/04/16 1426 01/30/16 1647       Core Components/Risk Factors/Patient Goals Review   Personal Goals Review Other  -  - Other;Increase Strength and Stamina    Review Pt feels great and is exercising on off days. Pt is up to 31mlles/day walking  Pt is very complaint with HEP and has noticed an increase in energy levels and able to do yardwork without fatigue Pt is very complaint with HEP and has noticed an increase in energy levels and able to do yardwork without fatigue Pt has noticed an increase in strength and stamina since d/c of Warfarin. Pt is walking 30 min, at 2/xweek in addition to coming to [facility].    Expected Outcomes Pt will continue exercising without any complications Pt will continue to perform ADL's and chores with fatigue or extreme exhaustion Pt will continue to perform ADL's and chores without  fatigue or extreme exhaustion Pt will continue to exercise at home without symptoms and increase overall exercise capacity.       Core Components/Risk Factors/Patient Goals at Discharge (Final Review):      Goals and Risk Factor Review - 01/30/16 1647      Core Components/Risk Factors/Patient Goals Review   Personal Goals Review Other;Increase Strength and Stamina   Review Pt has noticed an increase in strength and stamina since d/c of Warfarin. Pt is walking 30 min, at 2/xweek in addition to coming to [facility].   Expected Outcomes Pt will continue to exercise at home without symptoms and increase overall exercise capacity.      ITP Comments:     ITP Comments    Row Name 10/18/15 0905 01/25/16 1443         ITP Comments Dr. TFransico Him Medical Director attended Hypertension education lecture         Comments: Pt graduated from cardiac rehab program today with completion of 35  exercise sessions in Phase II. Pt maintained good attendance and progressed nicely during his participation in rehab as evidenced by increased MET level.   Medication list  reconciled. Repeat  PHQ score- 0.  Pt has made significant lifestyle changes and should be commended for his success. Pt has had significant improvement in his blood pressure control, with medication adjustments and exercise.  Pt feels he has achieved his goals during cardiac rehab, which include improved sense of wellbeing and outlook on active lifestyle. Pt reports he enjoys and looks forward to physical activity.  Pt also reports he feels a little better, physically and emotionally.    Pt plans to continue exercise in cardiac maintenance program.

## 2016-02-11 ENCOUNTER — Encounter (HOSPITAL_COMMUNITY): Payer: PPO

## 2016-02-13 ENCOUNTER — Encounter (HOSPITAL_COMMUNITY): Payer: PPO

## 2016-02-15 ENCOUNTER — Encounter (HOSPITAL_COMMUNITY): Payer: PPO

## 2016-02-15 MED FILL — AMLODIPINE BESYLATE 10 MG T: 10 | 30 days supply | Qty: 30 | Fill #1

## 2016-02-18 ENCOUNTER — Encounter (HOSPITAL_COMMUNITY): Payer: PPO

## 2016-02-20 ENCOUNTER — Encounter (HOSPITAL_COMMUNITY): Payer: PPO

## 2016-02-22 ENCOUNTER — Encounter (HOSPITAL_COMMUNITY): Payer: PPO

## 2016-02-27 MED FILL — METFORMIN HCL ER 500 MG TAB: 500 | 30 days supply | Qty: 60 | Fill #8

## 2016-02-27 MED FILL — SIMVASTATIN 20 MG TABLET: 20 | 90 days supply | Qty: 90 | Fill #3

## 2016-03-07 NOTE — Addendum Note (Signed)
Encounter addended by: Tanicia Wolaver D Bolden Hagerman on: 03/07/2016 11:16 AM<BR>    Actions taken: Flowsheet accepted, Visit Navigator Flowsheet section accepted

## 2016-03-10 MED FILL — IRBESARTAN 300 MG TABLET: 300 | 30 days supply | Qty: 30 | Fill #6

## 2016-03-10 MED FILL — METOPROLOL TARTRATE 25 MG T: 25 | 30 days supply | Qty: 90 | Fill #6

## 2016-03-10 NOTE — Addendum Note (Signed)
Encounter addended by: Jewel Baize, RD on: 03/10/2016 11:10 AM<BR>    Actions taken: Flowsheet data copied forward, Visit Navigator Flowsheet section accepted

## 2016-03-18 MED FILL — AMLODIPINE BESYLATE 10 MG T: 10 | 30 days supply | Qty: 30 | Fill #2

## 2016-03-25 MED FILL — METFORMIN HCL ER 500 MG TAB: 500 | 30 days supply | Qty: 60 | Fill #9

## 2016-04-08 MED FILL — METOPROLOL TARTRATE 25 MG T: 25 | 30 days supply | Qty: 90 | Fill #7

## 2016-04-08 MED FILL — IRBESARTAN 300 MG TABLET: 300 | 30 days supply | Qty: 30 | Fill #7

## 2016-04-18 MED FILL — AMLODIPINE BESYLATE 10 MG T: 10 | 30 days supply | Qty: 30 | Fill #3

## 2016-04-28 MED FILL — METFORMIN HCL ER 500 MG TAB: 500 | 30 days supply | Qty: 60 | Fill #10

## 2016-05-05 MED FILL — IRBESARTAN 300 MG TABLET: 300 | 30 days supply | Qty: 30 | Fill #8

## 2016-05-09 MED FILL — METOPROLOL TARTRATE 25 MG T: 25 | 30 days supply | Qty: 90 | Fill #8

## 2016-05-19 MED FILL — SIMVASTATIN 20 MG TABLET: 20 | 90 days supply | Qty: 90 | Fill #0

## 2016-05-19 MED FILL — AMLODIPINE BESYLATE 10 MG T: 10 | 30 days supply | Qty: 30 | Fill #4

## 2016-05-21 MED FILL — METFORMIN HCL ER 500 MG TAB: 500 | 30 days supply | Qty: 60 | Fill #11

## 2016-06-09 MED FILL — IRBESARTAN 300 MG TABLET: 300 | 30 days supply | Qty: 30 | Fill #9

## 2016-06-09 MED FILL — METOPROLOL TARTRATE 25 MG T: 25 | 30 days supply | Qty: 90 | Fill #9

## 2016-06-17 ENCOUNTER — Telehealth: Payer: Self-pay | Admitting: Interventional Cardiology

## 2016-06-17 NOTE — Telephone Encounter (Signed)
New message    Pt c/o Shortness Of Breath: STAT if SOB developed within the last 24 hours or pt is noticeably SOB on the phone  1. Are you currently SOB (can you hear that pt is SOB on the phone)? Not right now only with excertion  2. How long have you been experiencing SOB? Several weeks - not right now  3. Are you SOB when sitting or when up moving around? Just with exertion  4. Are you currently experiencing any other symptoms? Profuse sweating with exertion

## 2016-06-17 NOTE — Telephone Encounter (Signed)
Plan is okay.

## 2016-06-17 NOTE — Telephone Encounter (Signed)
Spoke with pt's wife, DPR on file.  She states that pt is having symptoms similar to those that he was having just prior to his open heart surgery.  Pt is SOB with exertion and becomes diaphoretic when SOB.  Wife also states pt has been more lethargic.  Pt has not voiced any complaints to wife but wife is calling due to these issues being noticeable.  Pt has not mentioned CP, lightheadedness, dizziness or feeling like he is in Afib.  These episodes started about 3-4 weeks ago.  No vitals available.  Scheduled pt to see Robbie Lis, PA-C tomorrow at 1:30PM.  Advised to take pt to ER with any worsening of symptoms.  Wife very appreciative for assistance.  Will route to Dr. Tamala Julian to make him aware.

## 2016-06-17 NOTE — Telephone Encounter (Signed)
Left message to call back  

## 2016-06-18 ENCOUNTER — Ambulatory Visit: Payer: PPO | Admitting: Physician Assistant

## 2016-06-18 MED FILL — AMLODIPINE BESYLATE 10 MG T: 10 | 30 days supply | Qty: 30 | Fill #5

## 2016-06-18 NOTE — Progress Notes (Deleted)
Cardiology Office Note    Date:  06/18/2016   ID:  [name] [name], [name] [name] 02-23-1946, MRN [id number]  PCP:  Jonathon Bellows, MD  Cardiologist:  Dr. Tamala Julian  Chief Complaint: SOB with exercise  History of Present Illness:   [name] [name] is a 71 y.o. male with a hx of CAD s/p CABG, post op afib, LBBB, HTN, HL, DM2, FHx CAD who added to my schedule for SOB.  He was evaluated for DOE and episodic L arm pain in 3/17.A Myoview was high risk and LHC demonstrated severe 3 vessel CAD. He underwent CABG with LIMA-LAD, SVG-PDA/PL 1, SVG-OM. Postoperative course was complicated by atrial fibrillation and was placed on amiodarone. He had recurrent episodes of atrial fibrillation and was placed on Coumadin. His Amiodarone was DC'd in 09/2015. Plan is to consider stopping Coumadin once he is off of Amiodarone for 3 mos and maintaining NSR.   Blood pressure did increase significantly when amlodipine was transiently discontinued because he developed a rash. As it turned out the rash was not related to amlodipine. Once the medication was resumed pressures been under great control.  He was doing well on cardiac stand point when last seen by Dr. Tamala Julian 01/11/16. 48 hours holter monitor did not showed any afib and subsequently his coumadin discontinued 01/15/16. Continue ASA.   Added to my schedule for exertional SOB. Similar to symptoms prior to CABG.    Past Medical History:  Diagnosis Date  . BPH (benign prostatic hyperplasia)    [facility]  . Coronary artery disease    a. Myoview 4/17: EF 47%, anteroseptal, apical anterior, apical septal, apical scar with peri-infarct ischemia, inferoseptal, inferior, inferolateral, apical inferior infarct with peri-infarct ischemia, high risk // b. LHC 4/17: Proximal RCA 99%, proximal LCx 100%, mid LAD 90%, distal LAD 60%, EF 55%  . Diabetes mellitus   . History of Doppler ultrasound    a. Carotid US 4/17:  bilateral ICA 1-39%  . History of echocardiogram    a.  Echo 4/17:  EF 60-65%, normal wall motion, grade 1 diastolic dysfunction  . HLD (hyperlipidemia)   . Hypertension   . LBBB (left bundle branch block)   . Nephrolithiasis     Past Surgical History:  Procedure Laterality Date  . APPENDECTOMY  1976  . CARDIAC CATHETERIZATION N/A 08/03/2015   Procedure: Left Heart Cath and Coronary Angiography;  Surgeon: Belva Crome, MD;  Location: [facility];  Service: Cardiovascular;  Laterality: N/A;  . COLONOSCOPY    . CORONARY ARTERY BYPASS GRAFT N/A 08/13/2015   Procedure: CORONARY ARTERY BYPASS GRAFTING (CABG) x 4 using left internal mammary artery and right leg saphenous vein;  Surgeon: Gaye Pollack, MD;  Location: [facility];  Service: Open Heart Surgery;  Laterality: N/A;  . TEE WITHOUT CARDIOVERSION N/A 08/13/2015   Procedure: TRANSESOPHAGEAL ECHOCARDIOGRAM (TEE);  Surgeon: Gaye Pollack, MD;  Location: [facility];  Service: Open Heart Surgery;  Laterality: N/A;    Current Medications: Prior to Admission medications   Medication Sig Start Date End Date Taking? Authorizing Provider  amLODipine (NORVASC) 10 MG tablet Take 1 tablet (10 mg total) by mouth daily. 11/26/15   Burtis Junes, NP  aspirin 81 MG tablet Take 81 mg by mouth daily.    Historical Provider, MD  Cholecalciferol (VITAMIN D) 2000 units tablet Take 2,000 Units by mouth 3 (three) times a week.     Historical Provider, MD  Cyanocobalamin (B-12) 2500 MCG TABS Take  1 tablet by mouth 3 (three) times a week.    Historical Provider, MD  irbesartan (AVAPRO) 300 MG tablet Take 300 mg by mouth daily.    Historical Provider, MD  metFORMIN (GLUCOPHAGE-XR) 500 MG 24 hr tablet Take 1,000 mg by mouth daily.  06/20/15   Historical Provider, MD  metoprolol tartrate (LOPRESSOR) 25 MG tablet Take 1.5 tablets (37.5 mg total) by mouth 2 (two) times daily. 09/14/15   Liliane Shi, PA-C  simvastatin (ZOCOR) 20 MG tablet Take 20 mg by mouth daily.    Historical Provider, MD  triamcinolone cream (KENALOG)  0.1 % Apply 1 application topically 2 (two) times daily as needed. Reported on 09/04/2015    Historical Provider, MD    Allergies:   Patient has no active allergies.   Social History   Social History  . Marital status: Married    Spouse name: N/A  . Number of children: N/A  . Years of education: N/A   Social History Main Topics  . Smoking status: Never Smoker  . Smokeless tobacco: Never Used  . Alcohol use No  . Drug use: No  . Sexual activity: Not on file   Other Topics Concern  . 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 ***  ROS:   Please see the history of present illness.    ROS All other systems reviewed and are negative.   PHYSICAL EXAM:   VS:  There were no vitals taken for this visit.   GEN: Well nourished, well developed, in no acute distress  HEENT: normal  Neck: no JVD, carotid bruits, or masses Cardiac: ***RRR; no murmurs, rubs, or gallops,no edema  Respiratory:  clear to auscultation bilaterally, normal work of breathing GI: soft, nontender, nondistended, + BS MS: no deformity or atrophy  Skin: warm and dry, no rash Neuro:  Alert and Oriented x 3, Strength and sensation are intact Psych: euthymic mood, full affect  Wt Readings from Last 3 Encounters:  03/07/16 192 lb 3.9 oz (87.2 kg)  01/11/16 190 lb 1.9 oz (86.2 kg)  11/30/15 186 lb 12.8 oz (84.7 kg)      Studies/Labs Reviewed:   EKG:  EKG is ordered today.  The ekg ordered today demonstrates ***  Recent Labs: 08/10/2015: ALT 37 08/14/2015: Magnesium 2.0 08/16/2015: Hemoglobin 10.2; Platelets 127 08/18/2015: BUN 17; Creatinine, Ser 1.14; Potassium 4.1; Sodium 139   Lipid Panel No results found for:  CHOL, TRIG, HDL, CHOLHDL, VLDL, LDLCALC, LDLDIRECT  Additional studies/ records that were reviewed today include:   48 hours holder 01/15/16  The basic underlying rhythm is normal sinus rhythm  No atrial fibrillation is noted.  Significant ventricular arrhythmias.   Minimum HR: 47 BPM at 12:30:46 PM Maximum HR: 104 BPM at 4:04:15 PM(2) Average HR: 61 BPM  Overall normal study with no atrial fibrillation.   Transthoracic  Echocardiogram: 08/01/15 ------------------------------------------------------------------- Study Conclusions  - Left ventricle: The cavity size was normal. Wall thickness was   normal. Systolic function was normal. The estimated ejection   fraction was in the range of 60% to 65%. Wall motion was normal;   there were no regional wall motion abnormalities.  Doppler   parameters are consistent with abnormal left ventricular   relaxation (grade 1 diastolic dysfunction).  Cardiac Catheterization: 08/03/15 Left Heart Cath and Coronary Angiography  Conclusion   1. Prox RCA lesion, 99% stenosed. 2. Prox Cx to Mid Cx lesion, 100% stenosed. 3. Mid LAD lesion, 90% stenosed. 4. Dist LAD lesion, 60% stenosed.    Severe three-vessel coronary disease in 71 year old diabetic patient with 90% proximal LAD arising after the second diagonal but before the first several perforated, total occlusion of the proximal circumflex, and high grade obstruction in the proximal RCA (dominant vessel)  Overall normal LV function with EF 55%   RECOMMENDATIONS:   CABG per TCTS    Coronary artery bypass grafting x 4 (08/13/15)   Left internal mammary graft to the LAD  Sequential SVG to PDA and PL1  SVG to OM Endoscopic vein harvest from the right leg   ASSESSMENT & PLAN:    1. SOB  2. CAB s/p CABG x4  3. HTN  4. HLD  5. PAF - Occurred post op 07/2015. Amiodarone was DC'd in 09/2015. Coumadin discontinued 12/2015 after 48 hours holter did not showed any  arrhythmias.    Medication Adjustments/Labs and Tests Ordered: Current medicines are reviewed at length with the patient today.  Concerns regarding medicines are outlined above.  Medication changes, Labs and Tests ordered today are listed in the Patient Instructions below. There are no Patient Instructions on file for this visit.   Jarrett Soho, [place]  06/18/2016 9:00 AM    [facility] [address], [place], [place]  [address] Phone: [phone]; Fax: [phone]

## 2016-06-27 MED FILL — METFORMIN HCL ER 500 MG TAB: 500 | 30 days supply | Qty: 60 | Fill #0

## 2016-07-07 MED FILL — METOPROLOL TARTRATE 25 MG T: 25 | 30 days supply | Qty: 90 | Fill #10

## 2016-07-07 MED FILL — IRBESARTAN 300 MG TABLET: 300 | 30 days supply | Qty: 30 | Fill #10

## 2016-07-16 ENCOUNTER — Ambulatory Visit: Payer: Self-pay | Admitting: Pharmacist

## 2016-07-16 DIAGNOSIS — Z5181 Encounter for therapeutic drug level monitoring: Secondary | ICD-10-CM

## 2016-07-16 DIAGNOSIS — Z951 Presence of aortocoronary bypass graft: Secondary | ICD-10-CM

## 2016-07-16 DIAGNOSIS — I480 Paroxysmal atrial fibrillation: Secondary | ICD-10-CM

## 2016-07-21 MED FILL — METFORMIN HCL ER 500 MG TAB: 500 | 30 days supply | Qty: 60 | Fill #1

## 2016-07-21 MED FILL — AMLODIPINE BESYLATE 10 MG T: 10 | 30 days supply | Qty: 30 | Fill #6

## 2016-07-26 NOTE — Progress Notes (Signed)
Cardiology Office Note    Date:  07/28/2016   ID:  [name] [name], [name] [name] 04/20/1946, MRN [id number]  PCP:  Jonathon Bellows, MD  Cardiologist: Sinclair Grooms, MD   Chief Complaint  Patient presents with  . Coronary Artery Disease    History of Present Illness:  [name] [name] is a 71 y.o. male with a hx of CAD s/p CABG with LIMA-LAD, SVG-PDA/PL 1, SVG-OM.2017, LBBB, HTN, HL, DM2, postoperative paroxysmal atrial fibrillation and previous amiodarone therapy. Maintained on Coumadin therapy with desired to remove anticoagulation.  Anticoagulation therapy was discontinued. He had post op paroxysmal atrial fibrillation following bypass surgery. No recurrence of palpitations or complaints since amiodarone and Coumadin were discontinued late last year. He denies angina. He has not been active over the winter because of weather issues. He has gained near 10 pounds. There is no peripheral edema or orthopnea. 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  Past Medical History:  Diagnosis Date  . BPH (benign prostatic hyperplasia)    [facility]  . Coronary artery disease    a. Myoview 4/17: EF 47%, anteroseptal, apical anterior, apical septal, apical scar with peri-infarct ischemia, inferoseptal, inferior, inferolateral, apical inferior infarct with peri-infarct ischemia, high risk // b. LHC 4/17: Proximal RCA 99%, proximal LCx 100%, mid LAD 90%, distal LAD 60%, EF 55%  . Diabetes mellitus   . History of Doppler ultrasound    a. Carotid US 4/17:  bilateral ICA 1-39%  . History of echocardiogram    a. Echo 4/17:  EF 60-65%, normal wall motion, grade 1 diastolic dysfunction  . HLD (hyperlipidemia)   . Hypertension   . LBBB (left bundle branch block)   . Nephrolithiasis     Past Surgical History:  Procedure Laterality Date  . APPENDECTOMY  1976  . CARDIAC CATHETERIZATION N/A 08/03/2015   Procedure: Left Heart Cath and Coronary Angiography;  Surgeon: Belva Crome, MD;   Location: [facility];  Service: Cardiovascular;  Laterality: N/A;  . COLONOSCOPY    . CORONARY ARTERY BYPASS GRAFT N/A 08/13/2015   Procedure: CORONARY ARTERY BYPASS GRAFTING (CABG) x 4 using left internal mammary artery and right leg saphenous vein;  Surgeon: Gaye Pollack, MD;  Location: [facility];  Service: Open Heart Surgery;  Laterality: N/A;  . TEE WITHOUT CARDIOVERSION N/A 08/13/2015   Procedure: TRANSESOPHAGEAL ECHOCARDIOGRAM (TEE);  Surgeon: Gaye Pollack, MD;  Location: [facility];  Service: Open Heart Surgery;  Laterality: N/A;    Current Medications: Outpatient Medications Prior to Visit  Medication Sig Dispense Refill  . amLODipine (NORVASC) 10 MG tablet Take 1 tablet (10 mg total) by mouth daily. 90 tablet 3  . aspirin 81 MG tablet Take 81 mg by mouth daily.    . Cholecalciferol (VITAMIN D) 2000 units tablet Take 2,000 Units by mouth 3 (three) times a week.     . Cyanocobalamin (B-12) 2500 MCG TABS Take 1 tablet by mouth 3 (three) times a week.    . irbesartan (AVAPRO) 300 MG tablet Take 300 mg by mouth daily.    . metFORMIN (GLUCOPHAGE-XR) 500 MG 24 hr tablet Take 1,000 mg by mouth daily.   11  . metoprolol tartrate (LOPRESSOR) 25 MG tablet Take 1.5 tablets (37.5 mg total) by mouth 2 (two) times daily. 270 tablet 3  . simvastatin (ZOCOR) 20 MG tablet Take 20 mg by mouth daily.    Marland Kitchen triamcinolone cream (KENALOG) 0.1 % Apply 1  application topically 2 (two) times daily as needed. Reported on 09/04/2015     No facility-administered medications prior to visit.      Allergies:   Patient has no known allergies.   Social History   Social History  . Marital status: Married    Spouse name: N/A  . Number of children: N/A  . Years of education: N/A   Social History Main Topics  . Smoking status: Never Smoker  . Smokeless tobacco: Never Used  . Alcohol use No  . Drug use: No  . Sexual activity: Not on file   Other Topics Concern  . 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   ROS:   Please see the history of present illness.    Irritation in the distal third of the incision due to keloid. Otherwise no complaints.  All other systems reviewed and are negative.   PHYSICAL EXAM:   VS:  BP (!) 152/78   Pulse 60   Ht 5\' 7"  (1.702 m)   Wt 195 lb (88.5 kg)   BMI 30.54 kg/m    GEN: Well nourished, well developed, in no acute distress  HEENT: normal  Neck: no JVD, carotid bruits, or masses Cardiac: RRR; no murmurs, rubs, or gallops,no edema  Respiratory:  clear to auscultation bilaterally, normal work of breathing GI: soft, nontender, nondistended, + BS MS: no deformity or atrophy  Skin: warm and dry, no rash Neuro:  Alert and Oriented x 3, Strength and sensation are intact Psych: euthymic mood, full affect  Wt Readings from Last 3 Encounters:  07/28/16 195 lb (88.5 kg)  03/07/16 192 lb 3.9 oz (87.2 kg)  01/11/16 190 lb 1.9 oz (86.2 kg)      Studies/Labs Reviewed:   EKG:  EKG  Not tested  Recent Labs: 08/10/2015: ALT 37 08/14/2015: Magnesium 2.0 08/16/2015: Hemoglobin 10.2; Platelets 127 08/18/2015: BUN 17; Creatinine, Ser 1.14; Potassium 4.1; Sodium 139   Lipid Panel No results found for: CHOL, TRIG, HDL, CHOLHDL, VLDL, LDLCALC, LDLDIRECT  Additional studies/ records that were reviewed today include:  No new data    ASSESSMENT:    1. Paroxysmal atrial fibrillation (HCC)   2. Essential hypertension   3. Other hyperlipidemia   4. Coronary artery disease involving coronary bypass graft of native heart with angina pectoris ([place])   5. On continuous oral anticoagulation      PLAN:  In order of problems listed above:  1. No recurrence since  discontinuation of anticoagulation/amiodarone greater than 3 months ago. Holter demonstrated no recurrence/paroxysmal atrial fibrillation 3 months after amiodarone was discontinued. He was encouraged to call us if any palpitations. 2. Poor control with systolic blood pressure 518 mmHg. Add HCTZ 12.5 mg per day and change her irbesartan to her irbesartan HCTZ 300/12.5 mg. Comprehensive metabolic panel in 3 weeks. 3. Lipids will be evaluated in 3 weeks. Continue simvastatin 20 mg per day. 4. Asymptomatic status post CABG. Plan clinical follow-up. Encouraged aerobic activity. 5. Coumadin has been discontinued. Aspirin 81 mg per day has been started.    Medication Adjustments/Labs and Tests Ordered: Current medicines are reviewed at length with the patient today.  Concerns regarding medicines are outlined above.  Medication changes, Labs and Tests ordered today are listed in the Patient Instructions below. There are no Patient Instructions on file for this visit.   Signed, Sinclair Grooms, MD  07/28/2016 9:36 AM    [facility] [address], [place], [place]  [address] Phone: [phone]; Fax: [phone]

## 2016-07-28 ENCOUNTER — Ambulatory Visit (INDEPENDENT_AMBULATORY_CARE_PROVIDER_SITE_OTHER): Payer: PPO | Admitting: Interventional Cardiology

## 2016-07-28 ENCOUNTER — Encounter: Payer: Self-pay | Admitting: Interventional Cardiology

## 2016-07-28 VITALS — BP 152/78 | HR 60 | Ht 67.0 in | Wt 195.0 lb

## 2016-07-28 DIAGNOSIS — Z7901 Long term (current) use of anticoagulants: Secondary | ICD-10-CM | POA: Diagnosis not present

## 2016-07-28 DIAGNOSIS — I480 Paroxysmal atrial fibrillation: Secondary | ICD-10-CM | POA: Diagnosis not present

## 2016-07-28 DIAGNOSIS — I25709 Atherosclerosis of coronary artery bypass graft(s), unspecified, with unspecified angina pectoris: Secondary | ICD-10-CM | POA: Diagnosis not present

## 2016-07-28 DIAGNOSIS — E784 Other hyperlipidemia: Secondary | ICD-10-CM | POA: Diagnosis not present

## 2016-07-28 DIAGNOSIS — I10 Essential (primary) hypertension: Secondary | ICD-10-CM | POA: Diagnosis not present

## 2016-07-28 DIAGNOSIS — E7849 Other hyperlipidemia: Secondary | ICD-10-CM

## 2016-07-28 MED ORDER — IRBESARTAN-HYDROCHLOROTHIAZIDE 300-12.5 MG PO TABS: 1.0000 | ORAL_TABLET | Freq: Every day | ORAL | 3 refills | Status: DC

## 2016-07-28 MED FILL — IRBESARTAN-HCTZ 300-12.5 MG: 300-12.5 | 30 days supply | Qty: 30 | Fill #0

## 2016-07-28 NOTE — Patient Instructions (Signed)
Medication Instructions:  1) CHANGE Irbesartan to Irbesartan/HCTZ 300/12.5mg  once daily.  Labwork: Your physician recommends that you return for lab work in: 3 weeks (Lipid, CMET)   Testing/Procedures: None  Follow-Up: Your physician wants you to follow-up in: 1 year with Dr. Tamala Julian. You will receive a reminder letter in the mail two months in advance. If you don't receive a letter, please call our office to schedule the follow-up appointment.   Any Other Special Instructions Will Be Listed Below (If Applicable).  Please monitor your blood pressure daily, approximately 2 hours after you take your blood pressure medication.  Bring a list of those readings with you and drop them off when you come for labs.    If you need a refill on your cardiac medications before your next appointment, please call your pharmacy.

## 2016-08-11 MED FILL — METOPROLOL TARTRATE 25 MG T: 25 | 30 days supply | Qty: 90 | Fill #11

## 2016-08-18 ENCOUNTER — Other Ambulatory Visit: Payer: PPO | Admitting: *Deleted

## 2016-08-18 DIAGNOSIS — E118 Type 2 diabetes mellitus with unspecified complications: Secondary | ICD-10-CM

## 2016-08-18 DIAGNOSIS — I25709 Atherosclerosis of coronary artery bypass graft(s), unspecified, with unspecified angina pectoris: Secondary | ICD-10-CM

## 2016-08-18 DIAGNOSIS — I480 Paroxysmal atrial fibrillation: Secondary | ICD-10-CM

## 2016-08-18 DIAGNOSIS — I10 Essential (primary) hypertension: Secondary | ICD-10-CM

## 2016-08-18 DIAGNOSIS — R000 Tachycardia, unspecified: Secondary | ICD-10-CM | POA: Diagnosis not present

## 2016-08-18 DIAGNOSIS — E7800 Pure hypercholesterolemia, unspecified: Secondary | ICD-10-CM | POA: Diagnosis not present

## 2016-08-18 LAB — LIPID PANEL
CHOLESTEROL TOTAL: 136 mg/dL (ref 100–199)
Chol/HDL Ratio: 4 ratio (ref 0.0–5.0)
HDL: 34 mg/dL — AB (ref >39)
LDL Calculated: 60 mg/dL (ref 0–99)
Triglycerides: 208 mg/dL — ABNORMAL HIGH (ref 0–149)
VLDL CHOLESTEROL CAL: 42 mg/dL — AB (ref 5–40)

## 2016-08-18 LAB — COMPREHENSIVE METABOLIC PANEL
ALBUMIN: 4.7 g/dL (ref 3.6–4.8)
ALK PHOS: 94 IU/L (ref 39–117)
ALT: 28 IU/L (ref 0–44)
AST: 24 IU/L (ref 0–40)
Albumin/Globulin Ratio: 2 (ref 1.2–2.2)
BILIRUBIN TOTAL: 0.6 mg/dL (ref 0.0–1.2)
BUN / CREAT RATIO: 14 (ref 10–24)
BUN: 17 mg/dL (ref 8–27)
CO2: 24 mmol/L (ref 18–29)
CREATININE: 1.24 mg/dL (ref 0.76–1.27)
Calcium: 10.1 mg/dL (ref 8.6–10.2)
Chloride: 95 mmol/L — ABNORMAL LOW (ref 96–106)
GFR calc non Af Amer: 59 mL/min/{1.73_m2} — ABNORMAL LOW (ref >59)
GFR, EST AFRICAN AMERICAN: 68 mL/min/{1.73_m2} (ref >59)
GLOBULIN, TOTAL: 2.3 g/dL (ref 1.5–4.5)
Glucose: 143 mg/dL — ABNORMAL HIGH (ref 65–99)
Potassium: 4.4 mmol/L (ref 3.5–5.2)
SODIUM: 137 mmol/L (ref 134–144)
TOTAL PROTEIN: 7 g/dL (ref 6.0–8.5)

## 2016-08-19 MED FILL — AMLODIPINE BESYLATE 10 MG T: 10 | 30 days supply | Qty: 30 | Fill #7

## 2016-08-22 MED FILL — IRBESARTAN-HCTZ 300-12.5 MG: 300-12.5 | 30 days supply | Qty: 30 | Fill #1

## 2016-08-25 MED FILL — SIMVASTATIN 20 MG TABLET: 20 | 90 days supply | Qty: 90 | Fill #1

## 2016-08-25 MED FILL — METFORMIN HCL ER 500 MG TAB: 500 | 30 days supply | Qty: 60 | Fill #2

## 2016-09-08 ENCOUNTER — Other Ambulatory Visit: Payer: Self-pay | Admitting: Physician Assistant

## 2016-09-08 MED FILL — METOPROLOL TARTRATE 25 MG T: 25 | 90 days supply | Qty: 270 | Fill #0

## 2016-09-19 MED FILL — AMLODIPINE BESYLATE 10 MG T: 10 | 30 days supply | Qty: 30 | Fill #8

## 2016-09-22 MED FILL — IRBESARTAN-HCTZ 300-12.5 MG: 300-12.5 | 30 days supply | Qty: 30 | Fill #2

## 2016-09-26 MED FILL — METFORMIN HCL ER 500 MG TAB: 500 | 30 days supply | Qty: 60 | Fill #3

## 2016-10-06 DIAGNOSIS — L570 Actinic keratosis: Secondary | ICD-10-CM | POA: Diagnosis not present

## 2016-10-06 DIAGNOSIS — B351 Tinea unguium: Secondary | ICD-10-CM | POA: Diagnosis not present

## 2016-10-20 MED FILL — AMLODIPINE BESYLATE 10 MG T: 10 | 30 days supply | Qty: 30 | Fill #9

## 2016-10-27 MED FILL — METFORMIN HCL ER 500 MG TAB: 500 | 30 days supply | Qty: 60 | Fill #4

## 2016-10-27 MED FILL — IRBESARTAN-HCTZ 300-12.5 MG: 300-12.5 | 30 days supply | Qty: 30 | Fill #3

## 2016-10-29 MED FILL — FLUOROURACIL 5% CREAM: 5 | 30 days supply | Qty: 40 | Fill #0

## 2016-11-18 ENCOUNTER — Other Ambulatory Visit: Payer: Self-pay | Admitting: Nurse Practitioner

## 2016-11-18 MED FILL — AMLODIPINE BESYLATE 10 MG T: 10 | 90 days supply | Qty: 90 | Fill #0

## 2016-11-24 MED FILL — SIMVASTATIN 20 MG TABLET: 20 | 90 days supply | Qty: 90 | Fill #2

## 2016-11-24 MED FILL — IRBESARTAN-HCTZ 300-12.5 MG: 300-12.5 | 30 days supply | Qty: 30 | Fill #4

## 2016-12-04 MED FILL — METFORMIN HCL ER 500 MG TAB: 500 | 30 days supply | Qty: 60 | Fill #5

## 2016-12-18 MED FILL — METOPROLOL TARTRATE 25 MG T: 25 | 90 days supply | Qty: 270 | Fill #1

## 2016-12-29 MED FILL — IRBESARTAN-HCTZ 300-12.5 MG: 300-12.5 | 30 days supply | Qty: 30 | Fill #5

## 2017-01-05 MED FILL — METFORMIN HCL ER 500 MG TAB: 500 | 30 days supply | Qty: 60 | Fill #6

## 2017-01-27 MED FILL — IRBESARTAN-HCTZ 300-12.5 MG: 300-12.5 | 30 days supply | Qty: 30 | Fill #6

## 2017-02-05 DIAGNOSIS — Z23 Encounter for immunization: Secondary | ICD-10-CM | POA: Diagnosis not present

## 2017-02-06 MED FILL — METFORMIN HCL ER 500 MG TAB: 500 | 30 days supply | Qty: 60 | Fill #7

## 2017-03-02 MED FILL — IRBESARTAN-HCTZ 300-12.5 MG: 300-12.5 | 30 days supply | Qty: 30 | Fill #7

## 2017-03-02 MED FILL — SIMVASTATIN 20 MG TABLET: 20 | 90 days supply | Qty: 90 | Fill #3

## 2017-03-02 MED FILL — AMLODIPINE BESYLATE 10 MG T: 10 | 90 days supply | Qty: 90 | Fill #1

## 2017-03-10 MED FILL — METFORMIN HCL ER 500 MG TAB: 500 | 30 days supply | Qty: 60 | Fill #8

## 2017-03-11 DIAGNOSIS — H6010 Cellulitis of external ear, unspecified ear: Secondary | ICD-10-CM | POA: Diagnosis not present

## 2017-03-11 MED FILL — SULFAMETHOXAZOLE/TMP DS TAB: 800-160 | 10 days supply | Qty: 20 | Fill #0

## 2017-03-14 DIAGNOSIS — H6012 Cellulitis of left external ear: Secondary | ICD-10-CM | POA: Diagnosis not present

## 2017-03-14 DIAGNOSIS — T7840XA Allergy, unspecified, initial encounter: Secondary | ICD-10-CM | POA: Diagnosis not present

## 2017-03-30 MED FILL — METOPROLOL TARTRATE 25 MG T: 25 | 90 days supply | Qty: 270 | Fill #2

## 2017-04-01 MED FILL — IRBESARTAN-HCTZ 300-12.5 MG: 300-12.5 | 30 days supply | Qty: 30 | Fill #8

## 2017-04-15 MED FILL — METFORMIN HCL ER 500 MG TAB: 500 | 30 days supply | Qty: 60 | Fill #9

## 2017-05-01 MED FILL — IRBESARTAN-HCTZ 300-12.5 MG: 300-12.5 | 30 days supply | Qty: 30 | Fill #9

## 2017-05-18 MED FILL — METFORMIN HCL ER 500 MG TAB: 500 | 30 days supply | Qty: 60 | Fill #10

## 2017-05-28 MED FILL — AMLODIPINE BESYLATE 10 MG T: 10 | 90 days supply | Qty: 90 | Fill #2

## 2017-06-08 MED FILL — IRBESARTAN-HCTZ 300-12.5 MG: 300-12.5 | 30 days supply | Qty: 30 | Fill #10

## 2017-06-08 MED FILL — SIMVASTATIN 20 MG TABLET: 20 | 90 days supply | Qty: 90 | Fill #0

## 2017-06-09 DIAGNOSIS — I10 Essential (primary) hypertension: Secondary | ICD-10-CM | POA: Diagnosis not present

## 2017-06-09 DIAGNOSIS — E785 Hyperlipidemia, unspecified: Secondary | ICD-10-CM | POA: Diagnosis not present

## 2017-06-09 DIAGNOSIS — Z0000 Encounter for general adult medical examination without abnormal findings: Secondary | ICD-10-CM | POA: Diagnosis not present

## 2017-06-09 DIAGNOSIS — E119 Type 2 diabetes mellitus without complications: Secondary | ICD-10-CM | POA: Diagnosis not present

## 2017-06-17 MED FILL — METFORMIN HCL ER 500 MG TAB: 500 | 30 days supply | Qty: 60 | Fill #11

## 2017-06-18 DIAGNOSIS — Z86010 Personal history of colonic polyps: Secondary | ICD-10-CM | POA: Diagnosis not present

## 2017-06-18 DIAGNOSIS — Z1211 Encounter for screening for malignant neoplasm of colon: Secondary | ICD-10-CM | POA: Diagnosis not present

## 2017-06-18 DIAGNOSIS — K5730 Diverticulosis of large intestine without perforation or abscess without bleeding: Secondary | ICD-10-CM | POA: Diagnosis not present

## 2017-06-23 ENCOUNTER — Telehealth: Payer: Self-pay | Admitting: Nurse Practitioner

## 2017-06-23 NOTE — Telephone Encounter (Signed)
   [facility] Pre-operative Risk Assessment    Request for surgical clearance:  1. What type of surgery is being performed? Colonoscopy   2. When is this surgery scheduled? 07/08/2017   3. What type of clearance is required (medical clearance vs. Pharmacy clearance to hold med vs. Both)? Both, patient takes Aspirin 81 mg daily  4. Are there any medications that need to be held prior to surgery and how long?None Indicated but patient takes ASA  5. Practice name and name of physician performing surgery? [facility], Dr. Collene Mares   6. What is your office phone and fax number? P: [phone] and F: [phone]   7. Anesthesia type (None, local, MAC, general) ? Propofol    Emmaline Life 06/23/2017, 1:55 PM  _________________________________________________________________   (provider comments below)

## 2017-06-25 NOTE — Telephone Encounter (Signed)
Left message for the patient to call back and discuss with on call preop APP for final clearance.

## 2017-06-26 NOTE — Telephone Encounter (Signed)
Follow up    Patient is returning call in reference to cardiac clearance. Please call to discuss.

## 2017-06-29 NOTE — Telephone Encounter (Signed)
   Primary Cardiologist: Dr. Tamala Julian  Chart reviewed as part of pre-operative protocol coverage. Because of [name] [name] past medical history and time since last visit, he/she will require a follow-up visit in order to better assess preoperative cardiovascular risk.  Pre-op covering staff: - Please schedule appointment and call patient to inform them. - Please contact requesting surgeon's office via preferred method (i.e, phone, fax) to inform them of need for appointment prior to surgery.  Lyda Jester, PA-C  06/29/2017, 2:22 PM

## 2017-06-30 NOTE — Telephone Encounter (Signed)
Pt scheduled to see Cecilie Kicks, NP tomorrow for clearance

## 2017-07-01 ENCOUNTER — Encounter: Payer: Self-pay | Admitting: Cardiology

## 2017-07-01 ENCOUNTER — Ambulatory Visit: Payer: PPO | Admitting: Cardiology

## 2017-07-01 VITALS — BP 150/74 | HR 65 | Ht 67.0 in | Wt 193.0 lb

## 2017-07-01 DIAGNOSIS — I2510 Atherosclerotic heart disease of native coronary artery without angina pectoris: Secondary | ICD-10-CM

## 2017-07-01 DIAGNOSIS — Z01818 Encounter for other preprocedural examination: Secondary | ICD-10-CM

## 2017-07-01 DIAGNOSIS — I480 Paroxysmal atrial fibrillation: Secondary | ICD-10-CM | POA: Diagnosis not present

## 2017-07-01 DIAGNOSIS — Z951 Presence of aortocoronary bypass graft: Secondary | ICD-10-CM | POA: Diagnosis not present

## 2017-07-01 DIAGNOSIS — E7849 Other hyperlipidemia: Secondary | ICD-10-CM | POA: Diagnosis not present

## 2017-07-01 DIAGNOSIS — I10 Essential (primary) hypertension: Secondary | ICD-10-CM

## 2017-07-01 NOTE — Progress Notes (Signed)
Cardiology Office Note   Date:  07/01/2017   ID:  [name], [name] 1945-12-12, MRN [id number]  PCP:  Maurice Small, MD  Cardiologist:  Dr. Tamala Julian     Chief Complaint  Patient presents with  . Coronary Artery Disease      History of Present Illness: [name] [name] is a 72 y.o. male who presents for pre-op evaluation colonoscopy 07/08/17 with hx of polyps.    He has a hx of CAD s/p CABG 2017 with LIMA-LAD, SVG-PDA/PL 1, SVG-OM.2017, LBBB, HTN, HL, DM2, postoperative paroxysmal atrial fibrillation and previous amiodarone therapy. Had been maintained on Coumadin therapy with desired to remove anticoagulation  So stopped a year ago.     Last lipids 07/2016 with TG of 208, LDL 60, HDL 34 and t.CHOL 136   Today no chest pain and no SOB, is able to ambulate without problems- he walks most days.  Does yard work and housework.  Can walk 2 flights of steps without problems.  No awareness of heart beat, no tachycardia.    Past Medical History:  Diagnosis Date  . BPH (benign prostatic hyperplasia)    [facility]  . Coronary artery disease    a. Myoview 4/17: EF 47%, anteroseptal, apical anterior, apical septal, apical scar with peri-infarct ischemia, inferoseptal, inferior, inferolateral, apical inferior infarct with peri-infarct ischemia, high risk // b. LHC 4/17: Proximal RCA 99%, proximal LCx 100%, mid LAD 90%, distal LAD 60%, EF 55%  . Diabetes mellitus   . History of Doppler ultrasound    a. Carotid US 4/17:  bilateral ICA 1-39%  . History of echocardiogram    a. Echo 4/17:  EF 60-65%, normal wall motion, grade 1 diastolic dysfunction  . HLD (hyperlipidemia)   . Hypertension   . LBBB (left bundle branch block)   . Nephrolithiasis     Past Surgical History:  Procedure Laterality Date  . APPENDECTOMY  1976  . CARDIAC CATHETERIZATION N/A 08/03/2015   Procedure: Left Heart Cath and Coronary Angiography;  Surgeon: Belva Crome, MD;  Location: [facility];  Service:  Cardiovascular;  Laterality: N/A;  . COLONOSCOPY    . CORONARY ARTERY BYPASS GRAFT N/A 08/13/2015   Procedure: CORONARY ARTERY BYPASS GRAFTING (CABG) x 4 using left internal mammary artery and right leg saphenous vein;  Surgeon: Gaye Pollack, MD;  Location: [facility];  Service: Open Heart Surgery;  Laterality: N/A;  . TEE WITHOUT CARDIOVERSION N/A 08/13/2015   Procedure: TRANSESOPHAGEAL ECHOCARDIOGRAM (TEE);  Surgeon: Gaye Pollack, MD;  Location: [place];  Service: Open Heart Surgery;  Laterality: N/A;     Current Outpatient Medications  Medication Sig Dispense Refill  . amLODipine (NORVASC) 10 MG tablet TAKE 1 TABLET BY MOUTH DAILY. 90 tablet 2  . aspirin 81 MG tablet Take 81 mg by mouth daily.    . Cholecalciferol (VITAMIN D) 2000 units tablet Take 2,000 Units by mouth 3 (three) times a week.     . Cyanocobalamin (B-12) 2500 MCG TABS Take 1 tablet by mouth 3 (three) times a week.    . irbesartan-hydrochlorothiazide (AVALIDE) 300-12.5 MG tablet Take 1 tablet by mouth daily. 90 tablet 3  . metFORMIN (GLUCOPHAGE-XR) 500 MG 24 hr tablet Take 1,000 mg by mouth daily.   11  . metoprolol tartrate (LOPRESSOR) 25 MG tablet TAKE 1 & 1/2 TABLETS BY MOUTH 2 TIMES DAILY 270 tablet 3  . simvastatin (ZOCOR) 20 MG tablet Take 20 mg by mouth daily.    Marland Kitchen  triamcinolone cream (KENALOG) 0.1 % Apply 1 application topically 2 (two) times daily as needed. Reported on 09/04/2015     No current facility-administered medications for this visit.     Allergies:   Septra [sulfamethoxazole-trimethoprim]    Social History:  The patient  reports that  has never smoked. he has never used smokeless tobacco. He reports that he does not drink alcohol or use drugs.   Family History:  The patient's family history includes Heart attack (age of onset: 71) in his father; Heart disease in his father and mother; Heart failure (age of onset: 81) in his mother.    ROS:  General:no colds or fevers, no weight changes Skin:no rashes  or ulcers HEENT:no blurred vision, no congestion CV:see HPI PUL:see HPI GI:no diarrhea constipation or melena, no indigestion GU:no hematuria, no dysuria MS:no joint pain, no claudication Neuro:no syncope, no lightheadedness Endo:no diabetes, no thyroid disease  Wt Readings from Last 3 Encounters:  07/01/17 193 lb (87.5 kg)  07/28/16 195 lb (88.5 kg)  03/07/16 192 lb 3.9 oz (87.2 kg)     PHYSICAL EXAM: VS:  BP (!) 150/74 (BP Location: Right Arm, Patient Position: Sitting, Cuff Size: Normal)   Pulse 65   Ht 5\' 7"  (1.702 m)   Wt 193 lb (87.5 kg)   SpO2 95%   BMI 30.23 kg/m  , BMI Body mass index is 30.23 kg/m. General:Pleasant affect, NAD Skin:Warm and dry, brisk capillary refill HEENT:normocephalic, sclera clear, mucus membranes moist Neck:supple, no JVD, no bruits  Heart:S1S2 RRR without murmur, gallup, rub or click Lungs:clear without rales, rhonchi, or wheezes WCH:ENID, non tender, + BS, do not palpate liver spleen or masses Ext:no lower ext edema, 2+ pedal pulses, 2+ radial pulses Neuro:alert and oriented, MAE, follows commands, + facial symmetry    EKG:  EKG is ordered today. The ekg ordered today demonstrates SB with LBBB rate of 57, no changes from 2017   Recent Labs: 08/18/2016: ALT 28; BUN 17; Creatinine, Ser 1.24; Potassium 4.4; Sodium 137    Lipid Panel    Component Value Date/Time   CHOL 136 08/18/2016 0820   TRIG 208 (H) 08/18/2016 0820   HDL 34 (L) 08/18/2016 0820   CHOLHDL 4.0 08/18/2016 0820   LDLCALC 60 08/18/2016 0820       Other studies Reviewed: Additional studies/ records that were reviewed today include: . 08/13/15 TEE Impression: Pre-bypass findings:     1. Aortic valve: The aortic valve leaflets were moderately   thickened.   The leaflets opened normally without restriction. There was no   aortic insufficiency.     2. Mitral valve: There was mild to moderate mitral annular   calcification. The mitral leaflets appeared to open  normally   without restriction. The leaflets coapted well without prolapsing   or flail segments. There was trace mitral insufficiency.     3. Left ventricle: The transgastric views of the left ventricle   were somewhat difficult due to the patient&'s heart orientation   and anatomy. There appeared to be adequate left ventricular   systolic function with the ejection fraction estimated at 55-60%.   There appeared to be left ventricular contractile dyssynchrony   due to the left bundle branch block. There was moderate left   ventricular hypertrophy which was concentric. Left ventricular   wall thickness measured 1.25 cm at end diastole at the inferior   wall in the transgastric short axis view and measured 1.20 cm at   the anterior wall at  end diastole in the transgastric short axis   view. Left ventricular end-diastolic diameter measured 39 mm.     4. Right ventricle: The right ventricular cavity was normal in   size. There was normal contractility of the right ventricular   free wall and normal appearing right ventricular systolic   function.     5. Tricuspid valve: The tricuspid valve appeared structurally   normal. There was trace tricuspid insufficiency.     6. Interatrial septum: The interatrial septum was intact without   evidence of patent foramen ovale or atrial septal defect by color   Doppler and bubble study.     7. Left atrium: There was no thrombus noted within the left   atrial cavity or left atrial appendage.     8. Ascending aorta: There was a well-defined aortic root and   sinotubular ridge. There was moderate thickening of the wall of   the ascending aorta but no protruding atheromata noted. The   diameter of the ascending aorta was within normal limits and   measured 34 mm at the sinuses of Valsalva. the sinotubular ridge   measured 28 mm, and the proximal ascending aorta measured 27.6   mm. The aortic annulus measured 20 mm.     9. Descending aorta: The  descending aorta showed no significant   atheromatous disease and measured 23 mm.     Post-bypass findings:     1.Aortic valve: The aortic valve was unchanged from the   pre-bypass study. The leaflets opened without restriction and   there was no aortic insufficiency.     2. Mitral valve: The mitral leaflets appeared to open normally   and there was trace mitral insufficiency.     3. Left ventricle: There appeared to be adequate contractility of   the left ventricle with normal appearing systolic function.   Ejection fraction was estimated at 60%. There were no regional   wall motion abnormalities.     4. Right ventricle: The right ventricular cavity was normal in   size and there was normal contractility of the right ventricular   free wall and normal appearing right ventricular systolic   function.     5. Tricuspid valve: The tricuspid valve appeared structurally   normal with trace tricuspid insufficiency.    Cardiac cath 08/03/15 Conclusion   1. Prox RCA lesion, 99% stenosed. 2. Prox Cx to Mid Cx lesion, 100% stenosed. 3. Mid LAD lesion, 90% stenosed. 4. Dist LAD lesion, 60% stenosed.    Severe three-vessel coronary disease in 72 year old diabetic patient with 90% proximal LAD arising after the second diagonal but before the first several perforated, total occlusion of the proximal circumflex, and high grade obstruction in the proximal RCA (dominant vessel)  Overall normal LV function with EF 55%     ASSESSMENT AND PLAN:  1.  Pre-op exam for colonoscopy, low risk, study and pt is low risk, post CABG in 2017 with ability to do 4 METS without chest pain or SOB.  Ok to hold ASA for procedure and resume afterwards.    2.  CAD with s/p CABG doing well no chest pain or SOB follow up with Dr. Tamala Julian in 9 months.   3.  PAF post CABG and none since, has been off coumadin for a year.  4.  HTN, BP is elevated today but usually in the 130s.  Systolic,  If BP remains elevated he  or PCP will call.    5.  HLD followed by  PCP and is on statin, continue.      Current medicines are reviewed with the patient today.  The patient Has no concerns regarding medicines.  The following changes have been made:  See above Labs/ tests ordered today include:see above  Disposition:   FU:  see above  Signed, Cecilie Kicks, NP  07/01/2017 1:58 PM    [facility] [place], [place], [place], [place] Phone: [phone]; Fax: [phone]

## 2017-07-01 NOTE — Patient Instructions (Signed)
Medication Instructions: Your physician recommends that you continue on your current medications as directed. Please refer to the Current Medication list given to you today.  Labwork: None Ordered  Procedures/Testing: None Ordered  Follow-Up: Your physician wants you to follow-up in: 9 MONTHS with Dr. Tamala Julian. You will receive a reminder letter in the mail two months in advance. If you don't receive a letter, please call our office to schedule the follow-up appointment.  If you need a refill on your cardiac medications before your next appointment, please call your pharmacy.

## 2017-07-03 MED FILL — METOPROLOL TARTRATE 25 MG T: 25 | 90 days supply | Qty: 270 | Fill #3

## 2017-07-03 MED FILL — IRBESARTAN-HCTZ 300-12.5 MG: 300-12.5 | 30 days supply | Qty: 30 | Fill #11

## 2017-07-03 MED FILL — GAVILYTE-G SOLUTION: 236 | 1 days supply | Qty: 4000 | Fill #0

## 2017-07-08 DIAGNOSIS — Z86010 Personal history of colonic polyps: Secondary | ICD-10-CM | POA: Diagnosis not present

## 2017-07-08 DIAGNOSIS — K6389 Other specified diseases of intestine: Secondary | ICD-10-CM | POA: Diagnosis not present

## 2017-07-08 DIAGNOSIS — K635 Polyp of colon: Secondary | ICD-10-CM | POA: Diagnosis not present

## 2017-07-08 DIAGNOSIS — Z1211 Encounter for screening for malignant neoplasm of colon: Secondary | ICD-10-CM | POA: Diagnosis not present

## 2017-07-08 DIAGNOSIS — D122 Benign neoplasm of ascending colon: Secondary | ICD-10-CM | POA: Diagnosis not present

## 2017-07-15 MED FILL — METFORMIN HCL ER 500 MG TAB: 500 | 30 days supply | Qty: 60 | Fill #0

## 2017-07-28 IMAGING — NM NM MISC PROCEDURE
3 series · 18 of 18 positions shown · non-contrast
Comparison: none

[Series 1: wbr_s-proj_st stress_(id)_sa · 6.5mm · 6.51mm/px · 6 of 512 frames shown (1 of 2)]
[frame 43/512]
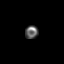
[frame 128/512]
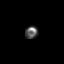
[frame 214/512]
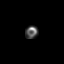
[frame 299/512]
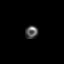
[frame 384/512]
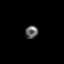
[frame 470/512]
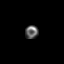

[Series 1: wbr_s-proj_st stress_(id)_sa · 6.5mm · 6.51mm/px · 6 of 64 frames shown (2 of 2)]
[frame 6/64]
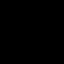
[frame 16/64]
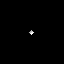
[frame 27/64]
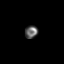
[frame 38/64]
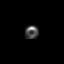
[frame 48/64]
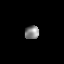
[frame 59/64]
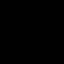

[Series 1: wbr_r-proj_st rest_(id)_sa · 6.5mm · 6.51mm/px · 6 of 64 frames shown]
[frame 6/64]
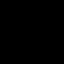
[frame 16/64]
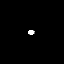
[frame 27/64]
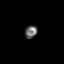
[frame 38/64]
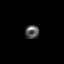
[frame 48/64]
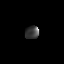
[frame 59/64]
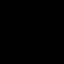

[18 of 18 positions shown; findings below may reference images not displayed]

Canned report from images found in remote index.

Refer to host system for actual result text.

## 2017-07-30 ENCOUNTER — Other Ambulatory Visit: Payer: Self-pay | Admitting: Interventional Cardiology

## 2017-07-30 MED FILL — IRBESARTAN-HCTZ 300-12.5 MG: 300-12.5 | 90 days supply | Qty: 90 | Fill #0

## 2017-08-17 MED FILL — METFORMIN HCL ER 500 MG TAB: 500 | 30 days supply | Qty: 60 | Fill #1

## 2017-08-31 ENCOUNTER — Other Ambulatory Visit: Payer: Self-pay | Admitting: Interventional Cardiology

## 2017-08-31 MED FILL — AMLODIPINE BESYLATE 10 MG T: 10 | 90 days supply | Qty: 90 | Fill #0

## 2017-09-07 MED FILL — SIMVASTATIN 20 MG TABLET: 20 | 90 days supply | Qty: 90 | Fill #1

## 2017-09-17 MED FILL — METFORMIN HCL ER 500 MG TAB: 500 | 30 days supply | Qty: 60 | Fill #2

## 2017-10-01 ENCOUNTER — Other Ambulatory Visit: Payer: Self-pay | Admitting: Physician Assistant

## 2017-10-01 MED FILL — METOPROLOL TARTRATE 25 MG T: 25 | 90 days supply | Qty: 270 | Fill #0

## 2017-10-19 MED FILL — METFORMIN HCL ER 500 MG TAB: 500 | 30 days supply | Qty: 60 | Fill #3

## 2017-11-04 MED FILL — IRBESARTAN-HCTZ 300-12.5 MG: 300-12.5 | 90 days supply | Qty: 90 | Fill #1

## 2017-11-18 MED FILL — METFORMIN HCL ER 500 MG TAB: 500 | 30 days supply | Qty: 60 | Fill #4

## 2017-11-30 MED FILL — AMLODIPINE BESYLATE 10 MG T: 10 | 90 days supply | Qty: 90 | Fill #1

## 2017-12-08 MED FILL — SIMVASTATIN 20 MG TABLET: 20 | 90 days supply | Qty: 90 | Fill #2

## 2017-12-17 MED FILL — METFORMIN HCL ER 500 MG TAB: 500 | 30 days supply | Qty: 60 | Fill #5

## 2017-12-30 MED FILL — METOPROLOL TARTRATE 25 MG T: 25 | 90 days supply | Qty: 270 | Fill #1

## 2018-01-18 MED FILL — METFORMIN HCL ER 500 MG TAB: 500 | 30 days supply | Qty: 60 | Fill #6

## 2018-01-22 DIAGNOSIS — Z23 Encounter for immunization: Secondary | ICD-10-CM | POA: Diagnosis not present

## 2018-02-02 MED FILL — IRBESARTAN-HCTZ 300-12.5 MG: 300-12.5 | 90 days supply | Qty: 90 | Fill #2

## 2018-02-16 MED FILL — metFORMIN HCL ER 500 MG TB2: 500 | 30 days supply | Qty: 60 | Fill #7

## 2018-02-26 MED FILL — AMLODIPINE BESYLATE 10 MG T: 10 | 90 days supply | Qty: 90 | Fill #2

## 2018-03-10 MED FILL — SIMVASTATIN 20 MG TABLET: 20 | 90 days supply | Qty: 90 | Fill #3

## 2018-03-22 MED FILL — metFORMIN HCL ER 500 MG TB2: 500 | 30 days supply | Qty: 60 | Fill #8

## 2018-04-05 MED FILL — METOPROLOL TARTRATE 25 MG T: 25 | 90 days supply | Qty: 270 | Fill #2

## 2018-04-09 ENCOUNTER — Encounter: Payer: Self-pay | Admitting: Interventional Cardiology

## 2018-04-19 MED FILL — metFORMIN HCL ER 500 MG TB2: 500 | 30 days supply | Qty: 60 | Fill #9

## 2018-05-03 NOTE — Progress Notes (Signed)
Cardiology Office Note:    Date:  05/04/2018   ID:  [name], [name] 26-Dec-1945, MRN [id number]  PCP:  Maurice Small, MD  Cardiologist:  No primary care provider on file.   Referring MD: Maurice Small, MD   Chief Complaint  Patient presents with  . Coronary Artery Disease  . Atrial Fibrillation    History of Present Illness:    [name] [name] is a 73 y.o. male with a hx of CAD s/p CABG with LIMA-LAD, SVG-PDA/PL 1, SVG-OM.2017, LBBB, HTN, HL, DM2, postoperative paroxysmal atrial fibrillation and previous amiodarone therapy. Coumadin therapy has been discontinued..  He has not had palpitations.  No tachycardia, lightheadedness, syncope, or other arrhythmia related symptoms.  No transient ischemic attacks.  He is relatively and active.  Denies claudication.  He has not had exertional related chest discomfort or excessive dyspnea.  Admits to sedentary lifestyle and weight gain.  No medication side effects.  Past Medical History:  Diagnosis Date  . BPH (benign prostatic hyperplasia)    [facility]  . Coronary artery disease    a. Myoview 4/17: EF 47%, anteroseptal, apical anterior, apical septal, apical scar with peri-infarct ischemia, inferoseptal, inferior, inferolateral, apical inferior infarct with peri-infarct ischemia, high risk // b. LHC 4/17: Proximal RCA 99%, proximal LCx 100%, mid LAD 90%, distal LAD 60%, EF 55%  . Diabetes mellitus   . History of Doppler ultrasound    a. Carotid US 4/17:  bilateral ICA 1-39%  . History of echocardiogram    a. Echo 4/17:  EF 60-65%, normal wall motion, grade 1 diastolic dysfunction  . HLD (hyperlipidemia)   . Hypertension   . LBBB (left bundle branch block)   . Nephrolithiasis     Past Surgical History:  Procedure Laterality Date  . APPENDECTOMY  1976  . CARDIAC CATHETERIZATION N/A 08/03/2015   Procedure: Left Heart Cath and Coronary Angiography;  Surgeon: Belva Crome, MD;  Location: [facility];  Service:  Cardiovascular;  Laterality: N/A;  . COLONOSCOPY    . CORONARY ARTERY BYPASS GRAFT N/A 08/13/2015   Procedure: CORONARY ARTERY BYPASS GRAFTING (CABG) x 4 using left internal mammary artery and right leg saphenous vein;  Surgeon: Gaye Pollack, MD;  Location: [facility];  Service: Open Heart Surgery;  Laterality: N/A;  . TEE WITHOUT CARDIOVERSION N/A 08/13/2015   Procedure: TRANSESOPHAGEAL ECHOCARDIOGRAM (TEE);  Surgeon: Gaye Pollack, MD;  Location: [facility];  Service: Open Heart Surgery;  Laterality: N/A;    Current Medications: Current Meds  Medication Sig  . amLODipine (NORVASC) 10 MG tablet TAKE 1 TABLET BY MOUTH DAILY.  Marland Kitchen aspirin 81 MG tablet Take 81 mg by mouth daily.  . Cholecalciferol (VITAMIN D) 2000 units tablet Take 2,000 Units by mouth 3 (three) times a week.   . Cyanocobalamin (B-12) 2500 MCG TABS Take 1 tablet by mouth 3 (three) times a week.  . irbesartan-hydrochlorothiazide (AVALIDE) 300-12.5 MG tablet TAKE 1 TABLET BY MOUTH ONCE DAILY  . metFORMIN (GLUCOPHAGE-XR) 500 MG 24 hr tablet Take 1,000 mg by mouth daily.   . metoprolol tartrate (LOPRESSOR) 25 MG tablet TAKE 1 & 1/2 TABLETS BY MOUTH 2 TIMES DAILY  . simvastatin (ZOCOR) 20 MG tablet Take 20 mg by mouth daily.  Marland Kitchen triamcinolone cream (KENALOG) 0.1 % Apply 1 application topically 2 (two) times daily as needed. Reported on 09/04/2015     Allergies:   Septra [sulfamethoxazole-trimethoprim]   Social History   Socioeconomic History  . Marital status: Married  Spouse name: Not on file  . Number of children: Not on file  . Years of education: Not on file  . Highest education level: Not on file  Occupational History  . Not on file  Social Needs  . Financial resource strain: Not on file  . Food insecurity:    Worry: Not on file    Inability: Not on file  . Transportation needs:    Medical: Not on file    Non-medical: Not on file  Tobacco Use  . Smoking status: Never Smoker  . Smokeless tobacco: Never Used  Substance  and Sexual Activity  . Alcohol use: No  . Drug use: No  . Sexual activity: Not on file  Lifestyle  . Physical activity:    Days per week: Not on file    Minutes per session: Not on file  . Stress: Not on file  Relationships  . Social connections:    Talks on phone: Not on file    Gets together: Not on file    Attends religious service: Not on file    Active member of club or organization: Not on file    Attends meetings of clubs or organizations: Not on file    Relationship status: Not on file  Other Topics Concern  . 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  ROS:   Please see the history of present illness.    Joint discomfort.  All other systems reviewed and are negative.  EKGs/Labs/Other Studies Reviewed:    The following studies were reviewed today: None  EKG:  EKG reveals normal sinus rhythm, left bundle branch block, and when compared to prior, no change has occurred with the exception the QRS duration is slightly less when compared to 07/01/2017.  Recent Labs: No results found for requested labs within last 8760 hours.  Recent Lipid Panel    Component Value Date/Time   CHOL 136 08/18/2016 0820   TRIG 208 (H) 08/18/2016 0820   HDL 34 (L) 08/18/2016 0820   CHOLHDL 4.0 08/18/2016 0820   LDLCALC 60 08/18/2016 0820    Physical Exam:    VS:  BP (!) 148/78   Pulse 62   Ht 5\' 7"  (1.702 m)   Wt 194 lb 12.8 oz (88.4 kg)   SpO2 96%   BMI 30.51 kg/m     Wt Readings from Last 3 Encounters:  05/04/18 194 lb 12.8 oz (88.4 kg)  07/01/17 193 lb (87.5 kg)  07/28/16 195 lb (88.5 kg)     GEN: Bearded, healthy-appearing.. No acute distress HEENT: Normal NECK: No  JVD. LYMPHATICS: No lymphadenopathy CARDIAC: RRR.  No murmur, gallop, edema VASCULAR: Pulses are 2+ and symmetric radial and carotid, Bruits are absent in the carotids RESPIRATORY:  Clear to auscultation without rales, wheezing or rhonchi  ABDOMEN: Soft, non-tender, non-distended, No pulsatile mass, MUSCULOSKELETAL: No deformity  SKIN: Warm and dry NEUROLOGIC:  Alert and oriented x 3 PSYCHIATRIC:  Normal affect   ASSESSMENT:    1. Paroxysmal atrial fibrillation (HCC)   2. Diabetes mellitus type 2 with complications (HCC)   3. Other hyperlipidemia   4. Essential hypertension   5. Coronary artery  disease involving coronary bypass graft of native heart with angina pectoris ([place])    PLAN:    In order of problems listed above:  1. No clinical recurrence 2. Consider SGLT2 therapy if hemoglobin A1c is difficult to control for cardiovascular benefits. 3. LDL target less than 70.  Last data revealed an LDL of 59 in February 2019. 4. Target blood pressure is 130/80 mmHg.  We discussed low-salt diet.  We discussed elimination of alcohol and increasing physical activity. 5. Aggressive secondary prevention as discussed below was outlined in detail.  He is markedly below target for activity.  Overall education and awareness concerning primary/secondary risk prevention was discussed in detail: LDL less than 70, hemoglobin A1c less than 7, blood pressure target less than 130/80 mmHg, >150 minutes of moderate aerobic activity per week, avoidance of smoking, weight control (via diet and exercise), and continued surveillance/management of/for obstructive sleep apnea.  1 year follow-up.  If blood pressure tends to stay higher than desired, would consider adding low-dose Aldactone 12.5 mg/day.   Medication Adjustments/Labs and Tests Ordered: Current medicines are reviewed at length with the patient today.  Concerns regarding medicines are outlined above.  Orders Placed This Encounter  Procedures  .  EKG 12-Lead   No orders of the defined types were placed in this encounter.   There are no Patient Instructions on file for this visit.   Signed, Sinclair Grooms, MD  05/04/2018 8:40 AM    [place]

## 2018-05-04 ENCOUNTER — Ambulatory Visit: Payer: PPO | Admitting: Interventional Cardiology

## 2018-05-04 ENCOUNTER — Encounter: Payer: Self-pay | Admitting: Interventional Cardiology

## 2018-05-04 VITALS — BP 148/78 | HR 62 | Ht 67.0 in | Wt 194.8 lb

## 2018-05-04 DIAGNOSIS — E118 Type 2 diabetes mellitus with unspecified complications: Secondary | ICD-10-CM | POA: Diagnosis not present

## 2018-05-04 DIAGNOSIS — I25709 Atherosclerosis of coronary artery bypass graft(s), unspecified, with unspecified angina pectoris: Secondary | ICD-10-CM

## 2018-05-04 DIAGNOSIS — E7849 Other hyperlipidemia: Secondary | ICD-10-CM

## 2018-05-04 DIAGNOSIS — I480 Paroxysmal atrial fibrillation: Secondary | ICD-10-CM | POA: Diagnosis not present

## 2018-05-04 DIAGNOSIS — I10 Essential (primary) hypertension: Secondary | ICD-10-CM | POA: Diagnosis not present

## 2018-05-04 NOTE — Patient Instructions (Signed)
Medication Instructions:  Your physician recommends that you continue on your current medications as directed. Please refer to the Current Medication list given to you today.  If you need a refill on your cardiac medications before your next appointment, please call your pharmacy.   Lab work: None  If you have labs (blood work) drawn today and your tests are completely normal, you will receive your results only by: . MyChart Message (if you have MyChart) OR . A paper copy in the mail If you have any lab test that is abnormal or we need to change your treatment, we will call you to review the results.  Testing/Procedures: None  Follow-Up: At [facility], you and your health needs are our priority.  As part of our continuing mission to provide you with exceptional heart care, we have created designated Provider Care Teams.  These Care Teams include your primary Cardiologist (physician) and Advanced Practice Providers (APPs -  Physician Assistants and Nurse Practitioners) who all work together to provide you with the care you need, when you need it. You will need a follow up appointment in 12 months.  Please call our office 2 months in advance to schedule this appointment.  You may see Dr. Smith or one of the following Advanced Practice Providers on your designated Care Team:   Lori Gerhardt, NP Laura Ingold, NP . Jill McDaniel, NP  Any Other Special Instructions Will Be Listed Below (If Applicable).  Your provider recommends that you maintain 150 minutes per week of moderate aerobic activity.   

## 2018-05-05 ENCOUNTER — Other Ambulatory Visit: Payer: Self-pay | Admitting: Interventional Cardiology

## 2018-05-05 MED FILL — IRBESARTAN-HCTZ 300-12.5 MG: 300-12.5 | 90 days supply | Qty: 90 | Fill #0

## 2018-05-18 MED FILL — metFORMIN HCL ER 500 MG TB2: 500 | 30 days supply | Qty: 60 | Fill #10

## 2018-06-02 MED FILL — AMLODIPINE BESYLATE 10 MG T: 10 | 90 days supply | Qty: 90 | Fill #3

## 2018-06-03 DIAGNOSIS — R202 Paresthesia of skin: Secondary | ICD-10-CM | POA: Diagnosis not present

## 2018-06-03 DIAGNOSIS — L814 Other melanin hyperpigmentation: Secondary | ICD-10-CM | POA: Diagnosis not present

## 2018-06-03 DIAGNOSIS — L570 Actinic keratosis: Secondary | ICD-10-CM | POA: Diagnosis not present

## 2018-06-03 DIAGNOSIS — Z23 Encounter for immunization: Secondary | ICD-10-CM | POA: Diagnosis not present

## 2018-06-03 DIAGNOSIS — L853 Xerosis cutis: Secondary | ICD-10-CM | POA: Diagnosis not present

## 2018-06-03 DIAGNOSIS — D225 Melanocytic nevi of trunk: Secondary | ICD-10-CM | POA: Diagnosis not present

## 2018-06-03 DIAGNOSIS — L821 Other seborrheic keratosis: Secondary | ICD-10-CM | POA: Diagnosis not present

## 2018-06-09 MED FILL — SIMVASTATIN 20 MG TABLET: 20 | 90 days supply | Qty: 90 | Fill #0

## 2018-06-16 MED FILL — metFORMIN HCL ER 500 MG TB2: 500 | 30 days supply | Qty: 60 | Fill #0

## 2018-06-18 DIAGNOSIS — Z7984 Long term (current) use of oral hypoglycemic drugs: Secondary | ICD-10-CM | POA: Diagnosis not present

## 2018-06-18 DIAGNOSIS — E785 Hyperlipidemia, unspecified: Secondary | ICD-10-CM | POA: Diagnosis not present

## 2018-06-18 DIAGNOSIS — E119 Type 2 diabetes mellitus without complications: Secondary | ICD-10-CM | POA: Diagnosis not present

## 2018-06-18 DIAGNOSIS — I10 Essential (primary) hypertension: Secondary | ICD-10-CM | POA: Diagnosis not present

## 2018-06-21 DIAGNOSIS — E785 Hyperlipidemia, unspecified: Secondary | ICD-10-CM | POA: Diagnosis not present

## 2018-06-21 DIAGNOSIS — I2510 Atherosclerotic heart disease of native coronary artery without angina pectoris: Secondary | ICD-10-CM | POA: Diagnosis not present

## 2018-06-21 DIAGNOSIS — E119 Type 2 diabetes mellitus without complications: Secondary | ICD-10-CM | POA: Diagnosis not present

## 2018-06-21 DIAGNOSIS — Z0000 Encounter for general adult medical examination without abnormal findings: Secondary | ICD-10-CM | POA: Diagnosis not present

## 2018-06-21 DIAGNOSIS — I10 Essential (primary) hypertension: Secondary | ICD-10-CM | POA: Diagnosis not present

## 2018-07-03 MED FILL — METOPROLOL TARTRATE 25 MG T: 25 | 90 days supply | Qty: 270 | Fill #3

## 2018-07-13 MED FILL — IRBESARTAN-HCTZ 300-12.5 MG: 300-12.5 | 90 days supply | Qty: 90 | Fill #1

## 2018-07-14 MED FILL — metFORMIN HCL ER 500 MG TB2: 500 | 30 days supply | Qty: 60 | Fill #0

## 2018-08-17 MED FILL — metFORMIN HCL ER 500 MG TB2: 500 | 30 days supply | Qty: 60 | Fill #1 | Status: TO

## 2018-09-07 ENCOUNTER — Other Ambulatory Visit: Payer: Self-pay | Admitting: Interventional Cardiology

## 2018-09-07 MED FILL — SIMVASTATIN 20 MG TABLET: 20 | 90 days supply | Qty: 90 | Fill #0

## 2018-09-07 MED FILL — AMLODIPINE BESYLATE 10 MG T: 10 | 90 days supply | Qty: 90 | Fill #0

## 2018-09-20 MED FILL — metFORMIN HCL ER 500 MG TB2: 500 | 30 days supply | Qty: 60 | Fill #0

## 2018-09-28 ENCOUNTER — Other Ambulatory Visit: Payer: Self-pay | Admitting: Physician Assistant

## 2018-09-29 MED FILL — METOPROLOL TARTRATE 25 MG T: 25 | 90 days supply | Qty: 270 | Fill #0

## 2018-10-13 MED FILL — metFORMIN HCL ER 500 MG TB2: 500 | 30 days supply | Qty: 60 | Fill #1

## 2018-11-02 MED FILL — IRBESARTAN-HCTZ 300-12.5 MG: 300-12.5 | 90 days supply | Qty: 90 | Fill #2

## 2018-11-15 MED FILL — metFORMIN HCL ER 500 MG TB2: 500 | 30 days supply | Qty: 60 | Fill #2

## 2018-12-06 MED FILL — AMLODIPINE BESYLATE 10 MG T: 10 | 90 days supply | Qty: 90 | Fill #0

## 2018-12-06 MED FILL — SIMVASTATIN 20 MG TABLET: 20 | 90 days supply | Qty: 90 | Fill #0

## 2018-12-15 MED FILL — metFORMIN HCL ER 500 MG TB2: 500 | 30 days supply | Qty: 60 | Fill #3

## 2018-12-30 MED FILL — METOPROLOL TARTRATE 25 MG T: 25 | 90 days supply | Qty: 270 | Fill #1

## 2019-01-14 MED FILL — metFORMIN HCL ER 500 MG TB2: 500 | 30 days supply | Qty: 60 | Fill #4

## 2019-01-21 DIAGNOSIS — Z23 Encounter for immunization: Secondary | ICD-10-CM | POA: Diagnosis not present

## 2019-02-01 ENCOUNTER — Telehealth: Payer: Self-pay | Admitting: Interventional Cardiology

## 2019-02-01 NOTE — Telephone Encounter (Signed)
New Message:3   Pt says he having pain on the right side of his neck and headaches on the right side of his head. Pt thinks he might need to be seen by somebody please.

## 2019-02-01 NOTE — Telephone Encounter (Signed)
Spoke with pt and he states been having HA on right side and right sided neck pain intermittently for 3 weeks.  Hurts below the ear and down into the jaw causing stiffness.  Normally doesn't have HAs.  Recent BPs are 143/76, 134/76, 132/73.  Denies any other sx.  Dr. Tamala Julian recommended contacting PCP.  Made pt aware.  He was agreeable to plan.

## 2019-02-02 DIAGNOSIS — M62838 Other muscle spasm: Secondary | ICD-10-CM | POA: Diagnosis not present

## 2019-02-02 MED FILL — tiZANidine HCL 4 MG TABS: 4 | 30 days supply | Qty: 15 | Fill #0

## 2019-02-03 MED FILL — IRBESARTAN-HCTZ 300-12.5 MG: 300-12.5 | 90 days supply | Qty: 90 | Fill #3

## 2019-02-14 MED FILL — METFORMIN HCL ER 500 MG TB2: 500 | 30 days supply | Qty: 60 | Fill #5

## 2019-02-28 MED FILL — SIMVASTATIN 20 MG TABLET: 20 | 90 days supply | Qty: 90 | Fill #1

## 2019-03-04 ENCOUNTER — Other Ambulatory Visit: Payer: Self-pay | Admitting: Interventional Cardiology

## 2019-03-04 MED FILL — AMLODIPINE BESYLATE 10 MG T: 10 | 90 days supply | Qty: 90 | Fill #0

## 2019-03-14 MED FILL — METFORMIN HCL ER 500 MG TB2: 500 | 30 days supply | Qty: 60 | Fill #6

## 2019-03-28 ENCOUNTER — Other Ambulatory Visit: Payer: Self-pay | Admitting: Physician Assistant

## 2019-03-28 MED FILL — METOPROLOL TARTRATE 25 MG T: 25 | 90 days supply | Qty: 270 | Fill #0

## 2019-04-13 MED FILL — METFORMIN HCL ER 500 MG TB2: 500 | 30 days supply | Qty: 60 | Fill #7

## 2019-04-28 ENCOUNTER — Other Ambulatory Visit: Payer: Self-pay | Admitting: Interventional Cardiology

## 2019-04-28 MED FILL — IRBESARTAN-HCTZ 300-12.5 MG: 300-12.5 | 90 days supply | Qty: 90 | Fill #0

## 2019-05-04 ENCOUNTER — Encounter: Payer: Self-pay | Admitting: Interventional Cardiology

## 2019-05-04 ENCOUNTER — Other Ambulatory Visit: Payer: Self-pay

## 2019-05-04 ENCOUNTER — Ambulatory Visit: Payer: PPO | Admitting: Interventional Cardiology

## 2019-05-04 VITALS — BP 144/76 | HR 65 | Ht 67.0 in | Wt 192.8 lb

## 2019-05-04 DIAGNOSIS — Z7189 Other specified counseling: Secondary | ICD-10-CM

## 2019-05-04 DIAGNOSIS — I480 Paroxysmal atrial fibrillation: Secondary | ICD-10-CM

## 2019-05-04 DIAGNOSIS — I10 Essential (primary) hypertension: Secondary | ICD-10-CM | POA: Diagnosis not present

## 2019-05-04 DIAGNOSIS — I25709 Atherosclerosis of coronary artery bypass graft(s), unspecified, with unspecified angina pectoris: Secondary | ICD-10-CM

## 2019-05-04 DIAGNOSIS — E7849 Other hyperlipidemia: Secondary | ICD-10-CM | POA: Diagnosis not present

## 2019-05-04 DIAGNOSIS — E118 Type 2 diabetes mellitus with unspecified complications: Secondary | ICD-10-CM

## 2019-05-04 NOTE — Patient Instructions (Signed)
Medication Instructions:  Your physician recommends that you continue on your current medications as directed. Please refer to the Current Medication list given to you today.  *If you need a refill on your cardiac medications before your next appointment, please call your pharmacy*  Lab Work: None If you have labs (blood work) drawn today and your tests are completely normal, you will receive your results only by: . MyChart Message (if you have MyChart) OR . A paper copy in the mail If you have any lab test that is abnormal or we need to change your treatment, we will call you to review the results.  Testing/Procedures: None  Follow-Up: At [facility], you and your health needs are our priority.  As part of our continuing mission to provide you with exceptional heart care, we have created designated Provider Care Teams.  These Care Teams include your primary Cardiologist (physician) and Advanced Practice Providers (APPs -  Physician Assistants and Nurse Practitioners) who all work together to provide you with the care you need, when you need it.  Your next appointment:   12 month(s)  The format for your next appointment:   In Person  Provider:   You may see Dr. Henry Smith or one of the following Advanced Practice Providers on your designated Care Team:    Lori Gerhardt, NP  Laura Ingold, NP  Jill McDaniel, NP   Other Instructions   

## 2019-05-04 NOTE — Progress Notes (Signed)
Cardiology Office Note:    Date:  05/04/2019   ID:  [name], [name] 12-07-1945, MRN [id number]  PCP:  Maurice Small, MD  Cardiologist:  No primary care provider on file.   Referring MD: Maurice Small, MD   Chief Complaint  Patient presents with  . Coronary Artery Disease    History of Present Illness:    [name] [name] is a 74 y.o. male with a hx of CAD s/p CABG with LIMA-LAD, SVG-PDA/PL 1, SVG-OM.2017,LBBB, HTN, HL, DM2, postoperative paroxysmal atrial fibrillation and previous amiodarone therapy. Coumadin therapy has been discontinued.Marland Kitchen  He denies cardiac complaints.  No angina, dyspnea, orthopnea, or edema.  No palpitations, dizziness, or episodes of syncope have occurred.  No specific medication side effects.  He is pleased with his overall condition.  Past Medical History:  Diagnosis Date  . BPH (benign prostatic hyperplasia)    [facility]  . Coronary artery disease    a. Myoview 4/17: EF 47%, anteroseptal, apical anterior, apical septal, apical scar with peri-infarct ischemia, inferoseptal, inferior, inferolateral, apical inferior infarct with peri-infarct ischemia, high risk // b. LHC 4/17: Proximal RCA 99%, proximal LCx 100%, mid LAD 90%, distal LAD 60%, EF 55%  . Diabetes mellitus   . History of Doppler ultrasound    a. Carotid US 4/17:  bilateral ICA 1-39%  . History of echocardiogram    a. Echo 4/17:  EF 60-65%, normal wall motion, grade 1 diastolic dysfunction  . HLD (hyperlipidemia)   . Hypertension   . LBBB (left bundle branch block)   . Nephrolithiasis     Past Surgical History:  Procedure Laterality Date  . APPENDECTOMY  1976  . CARDIAC CATHETERIZATION N/A 08/03/2015   Procedure: Left Heart Cath and Coronary Angiography;  Surgeon: Belva Crome, MD;  Location: [place] [facility];  Service: Cardiovascular;  Laterality: N/A;  . COLONOSCOPY    . CORONARY ARTERY BYPASS GRAFT N/A 08/13/2015   Procedure: CORONARY ARTERY BYPASS GRAFTING (CABG) x 4 using left  internal mammary artery and right leg saphenous vein;  Surgeon: Gaye Pollack, MD;  Location: [place] [facility];  Service: Open Heart Surgery;  Laterality: N/A;  . TEE WITHOUT CARDIOVERSION N/A 08/13/2015   Procedure: TRANSESOPHAGEAL ECHOCARDIOGRAM (TEE);  Surgeon: Gaye Pollack, MD;  Location: [place];  Service: Open Heart Surgery;  Laterality: N/A;    Current Medications: Current Meds  Medication Sig  . amLODipine (NORVASC) 10 MG tablet TAKE 1 TABLET BY MOUTH DAILY.  Marland Kitchen aspirin 81 MG tablet Take 81 mg by mouth daily.  . Cholecalciferol (VITAMIN D) 2000 units tablet Take 2,000 Units by mouth 3 (three) times a week.   . Cyanocobalamin (B-12) 2500 MCG TABS Take 1 tablet by mouth 3 (three) times a week.  . irbesartan-hydrochlorothiazide (AVALIDE) 300-12.5 MG tablet TAKE 1 TABLET BY MOUTH ONCE DAILY  . metFORMIN (GLUCOPHAGE-XR) 500 MG 24 hr tablet Take 1,000 mg by mouth daily.   . metoprolol tartrate (LOPRESSOR) 25 MG tablet TAKE 1 & 1/2 TABLETS BY MOUTH 2 TIMES DAILY  . simvastatin (ZOCOR) 20 MG tablet Take 20 mg by mouth daily.  Marland Kitchen tiZANidine (ZANAFLEX) 4 MG tablet Take 2 mg by mouth as needed.     Allergies:   Septra [sulfamethoxazole-trimethoprim]   Social History   Socioeconomic History  . Marital status: Married    Spouse name: Not on file  . Number of children: Not on file  . Years of education: Not on file  . Highest education level: Not  on file  Occupational History  . Not on file  Tobacco Use  . Smoking status: Never Smoker  . Smokeless tobacco: Never Used  Substance and Sexual Activity  . Alcohol use: No  . Drug use: No  . Sexual activity: Not on file  Other Topics Concern  . 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 Difficulty of Paying Living Expenses: Not on file  Food Insecurity:   . Worried About Paediatric nurse in the Last Year: Not on file  . Ran Out of Food in the Last Year: Not on file  Transportation Needs:   . Lack of Transportation (Medical): Not on file  . Lack of Transportation (Non-Medical): Not on file  Physical Activity:   . Days of Exercise per Week: Not on file  . Minutes of Exercise per Session: Not on file  Stress:   . Feeling of Stress : Not on file  Social Connections:   . Frequency of Communication with Friends and Family: Not on file  . Frequency of Social Gatherings with Friends and Family: Not on file  . Attends Religious Services: Not on file  . Active Member of Clubs or Organizations: Not on file  . Attends Archivist Meetings: Not on file  . Marital Status: Not on file     Family History: The patient's family history includes Heart attack (age of onset: 48) in his father; Heart disease in his father and mother; Heart failure (age of onset: 58) in his mother.  ROS:   Please see the history of present illness.    Intermittent right shoulder discomfort that seems to be positional and frequently associated with stiffness of soreness in his neck.  All other systems reviewed and are negative.  EKGs/Labs/Other Studies Reviewed:    The following studies were reviewed today: No new imaging data.  EKG:  EKG normal sinus rhythm, left bundle branch block, QRS duration 144 ms, and unchanged when compared to the prior tracing of January 2020.  Recent Labs: No results found for requested labs within last 8760 hours.  Recent Lipid Panel    Component Value Date/Time   CHOL 136 08/18/2016 0820   TRIG 208 (H) 08/18/2016 0820   HDL 34 (L) 08/18/2016 0820   CHOLHDL 4.0 08/18/2016 0820   LDLCALC 60 08/18/2016 0820    Physical Exam:    VS:  BP (!) 144/76   Pulse 65   Ht 5\' 7"  (1.702 m)   Wt 192 lb 12.8 oz (87.5 kg)   SpO2 96%   BMI 30.20 kg/m     Wt Readings from Last 3 Encounters:  05/04/19 192 lb 12.8 oz (87.5 kg)  05/04/18 194 lb 12.8 oz  (88.4 kg)  07/01/17 193 lb (87.5 kg)     GEN: Bearded and consistent with age. No acute distress HEENT: Normal NECK: No JVD. LYMPHATICS: No lymphadenopathy CARDIAC:  RRR without murmur, gallop, or edema. VASCULAR:  Normal Pulses. No bruits. RESPIRATORY:  Clear to auscultation without rales, wheezing or rhonchi  ABDOMEN: Soft, non-tender, non-distended, No pulsatile mass, MUSCULOSKELETAL: No deformity  SKIN: Warm and dry NEUROLOGIC:  Alert and oriented x 3 PSYCHIATRIC:  Normal affect   ASSESSMENT:    1. Coronary artery disease involving coronary bypass graft of native heart with  angina pectoris (HCC)   2. Paroxysmal atrial fibrillation ([place])   3. Diabetes mellitus type 2 with complications (HCC)   4. Other hyperlipidemia   5. Essential hypertension   6. Educated about COVID-19 virus infection    PLAN:    In order of problems listed above:  1. Secondary prevention discussed.  Blood pressure reevaluated.  Encouraged 150 minutes of moderate activity per week at a minimum. 2. No palpitations or clinical evidence of recurrent A. fib which was a postoperative phenomenon. 3. Target hemoglobin A1c less than 7.  Most recently 7.2. 4. LDL target less than 70.  Most recently 73.   5. Initial blood pressure 144/76 mmHg resolved to 134/78 mmHg after rest.  Low-salt diet and weight control discussed 6. 3W's and COVID-19 vaccine encouraged.  Overall education and awareness concerning primary/secondary risk prevention was discussed in detail: LDL less than 70, hemoglobin A1c less than 7, blood pressure target less than 130/80 mmHg, >150 minutes of moderate aerobic activity per week, avoidance of smoking, weight control (via diet and exercise), and continued surveillance/management of/for obstructive sleep apnea.    Medication Adjustments/Labs and Tests Ordered: Current medicines are reviewed at length with the patient today.  Concerns regarding medicines are outlined above.  No orders of  the defined types were placed in this encounter.  No orders of the defined types were placed in this encounter.   There are no Patient Instructions on file for this visit.   Signed, Sinclair Grooms, MD  05/04/2019 1:31 PM    [place]

## 2019-05-16 MED FILL — METFORMIN HCL ER 500 MG TB2: 500 | 30 days supply | Qty: 60 | Fill #8

## 2019-05-30 MED FILL — SIMVASTATIN 20 MG TABLET: 20 | 90 days supply | Qty: 90 | Fill #0

## 2019-06-02 ENCOUNTER — Other Ambulatory Visit: Payer: Self-pay | Admitting: Interventional Cardiology

## 2019-06-02 MED FILL — AMLODIPINE BESYLATE 10 MG T: 10 | 90 days supply | Qty: 90 | Fill #0

## 2019-06-08 DIAGNOSIS — L578 Other skin changes due to chronic exposure to nonionizing radiation: Secondary | ICD-10-CM | POA: Diagnosis not present

## 2019-06-08 DIAGNOSIS — L821 Other seborrheic keratosis: Secondary | ICD-10-CM | POA: Diagnosis not present

## 2019-06-08 DIAGNOSIS — L814 Other melanin hyperpigmentation: Secondary | ICD-10-CM | POA: Diagnosis not present

## 2019-06-08 DIAGNOSIS — D225 Melanocytic nevi of trunk: Secondary | ICD-10-CM | POA: Diagnosis not present

## 2019-06-08 DIAGNOSIS — L570 Actinic keratosis: Secondary | ICD-10-CM | POA: Diagnosis not present

## 2019-06-08 DIAGNOSIS — D485 Neoplasm of uncertain behavior of skin: Secondary | ICD-10-CM | POA: Diagnosis not present

## 2019-06-08 DIAGNOSIS — L309 Dermatitis, unspecified: Secondary | ICD-10-CM | POA: Diagnosis not present

## 2019-06-08 DIAGNOSIS — Z23 Encounter for immunization: Secondary | ICD-10-CM | POA: Diagnosis not present

## 2019-06-08 MED FILL — TRIAMCINOLONE 0.1% CREAM: 0.1 | 14 days supply | Qty: 80 | Fill #0

## 2019-06-13 MED FILL — METFORMIN HCL ER 500 MG TB2: 500 | 30 days supply | Qty: 60 | Fill #9

## 2019-06-27 MED FILL — METOPROLOL TARTRATE 25 MG T: 25 | 90 days supply | Qty: 270 | Fill #1

## 2019-06-28 DIAGNOSIS — E785 Hyperlipidemia, unspecified: Secondary | ICD-10-CM | POA: Diagnosis not present

## 2019-06-28 DIAGNOSIS — I2510 Atherosclerotic heart disease of native coronary artery without angina pectoris: Secondary | ICD-10-CM | POA: Diagnosis not present

## 2019-06-28 DIAGNOSIS — I10 Essential (primary) hypertension: Secondary | ICD-10-CM | POA: Diagnosis not present

## 2019-06-28 DIAGNOSIS — E119 Type 2 diabetes mellitus without complications: Secondary | ICD-10-CM | POA: Diagnosis not present

## 2019-06-28 DIAGNOSIS — Z0000 Encounter for general adult medical examination without abnormal findings: Secondary | ICD-10-CM | POA: Diagnosis not present

## 2019-07-14 MED FILL — METFORMIN HCL ER 500 MG TB2: 500 | 90 days supply | Qty: 180 | Fill #0

## 2019-07-27 MED FILL — IRBESARTAN-HCTZ 300-12.5 MG: 300-12.5 | 90 days supply | Qty: 90 | Fill #1

## 2019-09-20 MED FILL — METFORMIN HCL ER 500 MG TB2: 500 | 90 days supply | Qty: 180 | Fill #1

## 2019-09-26 ENCOUNTER — Other Ambulatory Visit: Payer: Self-pay | Admitting: Interventional Cardiology

## 2019-10-14 MED FILL — tiZANidine HCL 4 MG TABS: 4 | 30 days supply | Qty: 15 | Fill #1

## 2019-10-25 MED FILL — IRBESARTAN-HCTZ 300-12.5 MG: 300-12.5 | 90 days supply | Qty: 90 | Fill #2

## 2019-11-29 DIAGNOSIS — S0101XA Laceration without foreign body of scalp, initial encounter: Secondary | ICD-10-CM | POA: Diagnosis not present

## 2019-11-29 DIAGNOSIS — I10 Essential (primary) hypertension: Secondary | ICD-10-CM | POA: Diagnosis not present

## 2019-11-30 MED FILL — AMLODIPINE BESYLATE 10 MG T: 10 | 90 days supply | Qty: 90 | Fill #2

## 2019-11-30 MED FILL — SIMVASTATIN 20 MG TABLET: 20 | 90 days supply | Qty: 90 | Fill #2

## 2019-11-30 MED FILL — BOOSTRIX VACCINE SYRINGE: 5-2.5-18.5 | 1 days supply | Qty: 1 | Fill #0

## 2019-12-27 MED FILL — METOPROLOL TARTRATE 25 MG T: 25 | 90 days supply | Qty: 270 | Fill #1

## 2020-01-11 MED FILL — METFORMIN HCL ER 500 MG TB2: 500 | 90 days supply | Qty: 180 | Fill #2

## 2020-01-24 DIAGNOSIS — Z23 Encounter for immunization: Secondary | ICD-10-CM | POA: Diagnosis not present

## 2020-01-25 MED FILL — IRBESARTAN-HCTZ 300-12.5 MG: 300-12.5 | 90 days supply | Qty: 90 | Fill #3

## 2020-02-29 MED FILL — SIMVASTATIN 20 MG TABLET: 20 | 90 days supply | Qty: 90 | Fill #3

## 2020-02-29 MED FILL — AMLODIPINE BESYLATE 10 MG T: 10 | 90 days supply | Qty: 90 | Fill #3

## 2020-03-26 ENCOUNTER — Other Ambulatory Visit: Payer: Self-pay | Admitting: Interventional Cardiology

## 2020-03-26 DIAGNOSIS — I480 Paroxysmal atrial fibrillation: Secondary | ICD-10-CM

## 2020-03-27 ENCOUNTER — Other Ambulatory Visit: Payer: Self-pay | Admitting: Interventional Cardiology

## 2020-03-27 MED FILL — METOPROLOL TARTRATE 25 MG T: 25 | 90 days supply | Qty: 270 | Fill #0

## 2020-04-09 MED FILL — METFORMIN HCL ER 500 MG TB2: 500 | 90 days supply | Qty: 180 | Fill #3

## 2020-04-23 ENCOUNTER — Other Ambulatory Visit: Payer: Self-pay | Admitting: Interventional Cardiology

## 2020-04-25 ENCOUNTER — Other Ambulatory Visit: Payer: Self-pay | Admitting: Interventional Cardiology

## 2020-04-25 MED FILL — IRBESARTAN-HCTZ 300-12.5 MG: 300-12.5 | 90 days supply | Qty: 90 | Fill #0

## 2020-05-31 ENCOUNTER — Other Ambulatory Visit: Payer: Self-pay | Admitting: Interventional Cardiology

## 2020-05-31 MED FILL — SIMVASTATIN 20 MG TABLET: 20 | 90 days supply | Qty: 90 | Fill #0

## 2020-05-31 MED FILL — AMLODIPINE BESYLATE 10 MG T: 10 | 90 days supply | Qty: 90 | Fill #0

## 2020-06-05 ENCOUNTER — Other Ambulatory Visit (HOSPITAL_COMMUNITY): Payer: Self-pay | Admitting: Family Medicine

## 2020-06-18 MED FILL — METFORMIN HCL ER 500 MG TB2: 500 | 90 days supply | Qty: 180 | Fill #0

## 2020-06-22 ENCOUNTER — Other Ambulatory Visit: Payer: Self-pay | Admitting: Interventional Cardiology

## 2020-06-22 DIAGNOSIS — I480 Paroxysmal atrial fibrillation: Secondary | ICD-10-CM

## 2020-06-25 NOTE — Progress Notes (Signed)
Cardiology Office Note:    Date:  06/26/2020   ID:  Jariah, Jarmon January 03, 1946, MRN [id number]  PCP:  Maurice Small, MD  Cardiologist:  Sinclair Grooms, MD   Referring MD: Maurice Small, MD   Chief Complaint  Patient presents with  . Coronary Artery Disease  . Atrial Fibrillation    History of Present Illness:    [name] [name] is a 75 y.o. male with a hx of CAD s/p CABG with LIMA-LAD, SVG-PDA/PL 1, SVG-OM.2017,LBBB, HTN, HL, DM2, postoperative paroxysmal atrial fibrillation and previous amiodarone therapy. Coumadin therapy has been discontinued.Marland Kitchen  He is doing well.  He denies angina.  He has not had recurrent tachycardia palpitations.  Not using nitroglycerin.  Gets greater than 150 minutes of moderate activity per week.  Says he sleeps well.  Has not needed sublingual nitroglycerin.  No blood work in over a year.  Past Medical History:  Diagnosis Date  . BPH (benign prostatic hyperplasia)    [facility]  . Coronary artery disease    a. Myoview 4/17: EF 47%, anteroseptal, apical anterior, apical septal, apical scar with peri-infarct ischemia, inferoseptal, inferior, inferolateral, apical inferior infarct with peri-infarct ischemia, high risk // b. LHC 4/17: Proximal RCA 99%, proximal LCx 100%, mid LAD 90%, distal LAD 60%, EF 55%  . Diabetes mellitus   . History of Doppler ultrasound    a. Carotid US 4/17:  bilateral ICA 1-39%  . History of echocardiogram    a. Echo 4/17:  EF 60-65%, normal wall motion, grade 1 diastolic dysfunction  . HLD (hyperlipidemia)   . Hypertension   . LBBB (left bundle branch block)   . Nephrolithiasis     Past Surgical History:  Procedure Laterality Date  . APPENDECTOMY  1976  . CARDIAC CATHETERIZATION N/A 08/03/2015   Procedure: Left Heart Cath and Coronary Angiography;  Surgeon: Belva Crome, MD;  Location: [place] [facility];  Service: Cardiovascular;  Laterality: N/A;  . COLONOSCOPY    . CORONARY ARTERY BYPASS GRAFT N/A 08/13/2015    Procedure: CORONARY ARTERY BYPASS GRAFTING (CABG) x 4 using left internal mammary artery and right leg saphenous vein;  Surgeon: Gaye Pollack, MD;  Location: [place] [facility];  Service: Open Heart Surgery;  Laterality: N/A;  . TEE WITHOUT CARDIOVERSION N/A 08/13/2015   Procedure: TRANSESOPHAGEAL ECHOCARDIOGRAM (TEE);  Surgeon: Gaye Pollack, MD;  Location: [place];  Service: Open Heart Surgery;  Laterality: N/A;    Current Medications: Current Meds  Medication Sig  . amLODipine (NORVASC) 10 MG tablet TAKE 1 TABLET BY MOUTH DAILY.  Marland Kitchen aspirin 81 MG tablet Take 81 mg by mouth daily.  . Cholecalciferol (VITAMIN D) 2000 units tablet Take 2,000 Units by mouth 3 (three) times a week.   . Cyanocobalamin (B-12) 2500 MCG TABS Take 1 tablet by mouth 3 (three) times a week.  . irbesartan-hydrochlorothiazide (AVALIDE) 300-12.5 MG tablet TAKE 1 TABLET BY MOUTH ONCE DAILY  . metFORMIN (GLUCOPHAGE-XR) 500 MG 24 hr tablet Take 1,000 mg by mouth daily.   . simvastatin (ZOCOR) 20 MG tablet Take 20 mg by mouth daily.  Marland Kitchen tiZANidine (ZANAFLEX) 4 MG tablet Take 2 mg by mouth as needed.  . [DISCONTINUED] metoprolol tartrate (LOPRESSOR) 25 MG tablet TAKE 1 & 1/2 TABLETS BY MOUTH TWICE A DAY     Allergies:   Septra [sulfamethoxazole-trimethoprim]   Social History   Socioeconomic History  . Marital status: Married    Spouse name: Not on file  . Number  of children: Not on file  . Years of education: Not on file  . Highest education level: Not on file  Occupational History  . Not on file  Tobacco Use  . Smoking status: Never Smoker  . Smokeless tobacco: Never Used  Vaping Use  . Vaping Use: Never used  Substance and Sexual Activity  . Alcohol use: No  . Drug use: No  . Sexual activity: Not on file  Other Topics Concern  . 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  ROS:   Please see the history of present illness.    Has neck cracking/cracking/popping with turning his head.  All other systems reviewed and are negative.  EKGs/Labs/Other Studies Reviewed:    The following studies were reviewed today: No imaging data  EKG:  EKG normal sinus rhythm, left bundle branch block, PR interval 152 ms.  When compared to prior, heart rate is slower.  Recent Labs: No results found for requested labs within last 8760 hours.  Recent Lipid Panel    Component Value Date/Time   CHOL 136 08/18/2016 0820   TRIG 208 (H) 08/18/2016 0820   HDL 34 (L) 08/18/2016 0820   CHOLHDL 4.0 08/18/2016 0820   LDLCALC 60 08/18/2016 0820    Physical Exam:    VS:  BP (!) 144/76   Pulse 63   Ht 5\' 7"  (1.702 m)   Wt 189 lb 3.2 oz (85.8 kg)   SpO2 95%   BMI 29.63 kg/m     Wt Readings from Last 3 Encounters:  06/26/20 189 lb 3.2 oz (85.8 kg)  05/04/19 192 lb 12.8 oz (87.5 kg)  05/04/18 194 lb 12.8 oz (88.4 kg)     GEN: Full beard.  Difficult to visualize his neck.. No acute distress HEENT: Normal NECK: No JVD. LYMPHATICS: No lymphadenopathy CARDIAC: No murmur. RRR no gallop, or edema. VASCULAR:  Normal Pulses. No bruits. RESPIRATORY:  Clear to auscultation without rales, wheezing or rhonchi  ABDOMEN: Soft, non-tender, non-distended, No pulsatile mass, MUSCULOSKELETAL: No deformity  SKIN: Warm and dry NEUROLOGIC:  Alert and oriented x 3 PSYCHIATRIC:  Normal affect   ASSESSMENT:    1. Coronary artery disease involving coronary bypass graft of native heart with angina pectoris (HCC)   2.  Paroxysmal atrial fibrillation ([place])   3. Diabetes mellitus type 2 with complications (HCC)   4. Other hyperlipidemia   5. Essential hypertension   6. Educated about COVID-19 virus infection    PLAN:    In order of problems listed above:  1. Secondary prevention discussed 2. Continue to monitor for PAF.  No recurrence clinically. 3. Discussed target A1c less than 7.  Last A1c was 7 three 1 year ago.  Appointment with Dr. Justin Mend is coming up. 4. Continue Zocor 20 mg/day.  Target LDL less than 70 5. Blood pressure is mildly elevated today.  We mostly discussed nonpharmacologic means for blood  pressure reduction which includes sodium restriction to less than 2-1/2 g/day, physical activity, weight loss, alcohol control, and avoidance of nonsteroidal anti-inflammatory 6. Vaccinated and boosted.  Overall education and awareness concerning secondary risk prevention was discussed in detail: LDL less than 70, hemoglobin A1c less than 7, blood pressure target less than 130/80 mmHg, >150 minutes of moderate aerobic activity per week, avoidance of smoking, weight control (via diet and exercise), and continued surveillance/management of/for obstructive sleep apnea.    Medication Adjustments/Labs and Tests Ordered: Current medicines are reviewed at length with the patient today.  Concerns regarding medicines are outlined above.  Orders Placed This Encounter  Procedures  . EKG 12-Lead   Meds ordered this encounter  Medications  . metoprolol tartrate (LOPRESSOR) 25 MG tablet    Sig: 1 1/2 tablets by mouth twice daily    Dispense:  270 tablet    Refill:  3    Patient Instructions  Medication Instructions:  Your physician recommends that you continue on your current medications as directed. Please refer to the Current Medication list given to you today.  *If you need a refill on your cardiac medications before your next appointment, please call your pharmacy*   Lab Work: None If you have labs  (blood work) drawn today and your tests are completely normal, you will receive your results only by: Marland Kitchen MyChart Message (if you have MyChart) OR . A paper copy in the mail If you have any lab test that is abnormal or we need to change your treatment, we will call you to review the results.   Testing/Procedures: None   Follow-Up: At [facility], you and your health needs are our priority.  As part of our continuing mission to provide you with exceptional heart care, we have created designated Provider Care Teams.  These Care Teams include your primary Cardiologist (physician) and Advanced Practice Providers (APPs -  Physician Assistants and Nurse Practitioners) who all work together to provide you with the care you need, when you need it.  We recommend signing up for the patient portal called "MyChart".  Sign up information is provided on this After Visit Summary.  MyChart is used to connect with patients for Virtual Visits (Telemedicine).  Patients are able to view lab/test results, encounter notes, upcoming appointments, etc.  Non-urgent messages can be sent to your provider as well.   To learn more about what you can do with MyChart, go to [email or web address].    Your next appointment:   1 year(s)  The format for your next appointment:   In Person  Provider:   You may see Sinclair Grooms, MD or one of the following Advanced Practice Providers on your designated Care Team:    Kathyrn Drown, NP    Other Instructions      Signed, Sinclair Grooms, MD  06/26/2020 9:57 AM    [place]

## 2020-06-26 ENCOUNTER — Ambulatory Visit: Payer: PPO | Admitting: Interventional Cardiology

## 2020-06-26 ENCOUNTER — Encounter: Payer: Self-pay | Admitting: Interventional Cardiology

## 2020-06-26 ENCOUNTER — Other Ambulatory Visit: Payer: Self-pay

## 2020-06-26 ENCOUNTER — Other Ambulatory Visit: Payer: Self-pay | Admitting: Interventional Cardiology

## 2020-06-26 VITALS — BP 144/76 | HR 63 | Ht 67.0 in | Wt 189.2 lb

## 2020-06-26 DIAGNOSIS — I480 Paroxysmal atrial fibrillation: Secondary | ICD-10-CM | POA: Diagnosis not present

## 2020-06-26 DIAGNOSIS — Z7189 Other specified counseling: Secondary | ICD-10-CM | POA: Diagnosis not present

## 2020-06-26 DIAGNOSIS — E7849 Other hyperlipidemia: Secondary | ICD-10-CM

## 2020-06-26 DIAGNOSIS — I10 Essential (primary) hypertension: Secondary | ICD-10-CM | POA: Diagnosis not present

## 2020-06-26 DIAGNOSIS — I25709 Atherosclerosis of coronary artery bypass graft(s), unspecified, with unspecified angina pectoris: Secondary | ICD-10-CM | POA: Diagnosis not present

## 2020-06-26 DIAGNOSIS — E118 Type 2 diabetes mellitus with unspecified complications: Secondary | ICD-10-CM

## 2020-06-26 MED ORDER — METOPROLOL TARTRATE 25 MG PO TABS: ORAL_TABLET | ORAL | 3 refills | Status: DC

## 2020-06-26 MED FILL — METOPROLOL TARTRATE 25 MG T: 25 | 90 days supply | Qty: 270 | Fill #0

## 2020-06-26 NOTE — Patient Instructions (Signed)

## 2020-07-26 DIAGNOSIS — E1165 Type 2 diabetes mellitus with hyperglycemia: Secondary | ICD-10-CM | POA: Diagnosis not present

## 2020-07-26 DIAGNOSIS — E785 Hyperlipidemia, unspecified: Secondary | ICD-10-CM | POA: Diagnosis not present

## 2020-07-26 DIAGNOSIS — I10 Essential (primary) hypertension: Secondary | ICD-10-CM | POA: Diagnosis not present

## 2020-07-26 DIAGNOSIS — I2510 Atherosclerotic heart disease of native coronary artery without angina pectoris: Secondary | ICD-10-CM | POA: Diagnosis not present

## 2020-07-26 DIAGNOSIS — Z0000 Encounter for general adult medical examination without abnormal findings: Secondary | ICD-10-CM | POA: Diagnosis not present

## 2020-07-26 MED FILL — Irbesartan-Hydrochlorothiazide Tab 300-12.5 MG: ORAL | 90 days supply | Qty: 90 | Fill #0 | Status: AC

## 2020-07-27 ENCOUNTER — Other Ambulatory Visit (HOSPITAL_COMMUNITY): Payer: Self-pay

## 2020-08-30 ENCOUNTER — Other Ambulatory Visit (HOSPITAL_COMMUNITY): Payer: Self-pay

## 2020-08-30 MED FILL — Amlodipine Besylate Tab 10 MG (Base Equivalent): ORAL | 90 days supply | Qty: 90 | Fill #0 | Status: AC

## 2020-08-30 MED FILL — Simvastatin Tab 20 MG: ORAL | 90 days supply | Qty: 90 | Fill #0 | Status: AC

## 2020-09-10 DIAGNOSIS — L578 Other skin changes due to chronic exposure to nonionizing radiation: Secondary | ICD-10-CM | POA: Diagnosis not present

## 2020-09-10 DIAGNOSIS — L821 Other seborrheic keratosis: Secondary | ICD-10-CM | POA: Diagnosis not present

## 2020-09-10 DIAGNOSIS — L814 Other melanin hyperpigmentation: Secondary | ICD-10-CM | POA: Diagnosis not present

## 2020-09-10 DIAGNOSIS — D225 Melanocytic nevi of trunk: Secondary | ICD-10-CM | POA: Diagnosis not present

## 2020-09-10 DIAGNOSIS — L570 Actinic keratosis: Secondary | ICD-10-CM | POA: Diagnosis not present

## 2020-09-23 MED FILL — Metoprolol Tartrate Tab 25 MG: ORAL | 90 days supply | Qty: 270 | Fill #0 | Status: AC

## 2020-09-24 ENCOUNTER — Other Ambulatory Visit (HOSPITAL_COMMUNITY): Payer: Self-pay

## 2020-10-02 ENCOUNTER — Other Ambulatory Visit (HOSPITAL_COMMUNITY): Payer: Self-pay

## 2020-10-02 MED ORDER — METFORMIN HCL ER 500 MG PO TB24
1000.0000 mg | ORAL_TABLET | Freq: Every day | ORAL | 1 refills | Status: DC
  Filled 2020-10-02: qty 180, 90d supply, fill #0
  Filled 2021-01-10: qty 180, 90d supply, fill #1

## 2020-10-03 ENCOUNTER — Other Ambulatory Visit (HOSPITAL_COMMUNITY): Payer: Self-pay

## 2020-10-27 MED FILL — Irbesartan-Hydrochlorothiazide Tab 300-12.5 MG: ORAL | 90 days supply | Qty: 90 | Fill #1 | Status: AC

## 2020-10-29 ENCOUNTER — Other Ambulatory Visit (HOSPITAL_COMMUNITY): Payer: Self-pay

## 2020-11-28 ENCOUNTER — Other Ambulatory Visit (HOSPITAL_COMMUNITY): Payer: Self-pay

## 2020-11-28 MED FILL — Simvastatin Tab 20 MG: ORAL | 90 days supply | Qty: 90 | Fill #1 | Status: AC

## 2020-12-12 ENCOUNTER — Other Ambulatory Visit (HOSPITAL_COMMUNITY): Payer: Self-pay

## 2020-12-12 MED ORDER — ZOSTER VAC RECOMB ADJUVANTED 50 MCG/0.5ML IM SUSR
0.5000 mL | INTRAMUSCULAR | 1 refills | Status: DC
  Filled 2020-12-12 (×2): qty 0.5, 1d supply, fill #0
  Filled 2020-12-18 – 2021-05-24 (×4): qty 0.5, 1d supply, fill #1

## 2020-12-14 ENCOUNTER — Other Ambulatory Visit (HOSPITAL_COMMUNITY): Payer: Self-pay

## 2020-12-18 ENCOUNTER — Other Ambulatory Visit (HOSPITAL_COMMUNITY): Payer: Self-pay

## 2020-12-21 ENCOUNTER — Other Ambulatory Visit (HOSPITAL_COMMUNITY): Payer: Self-pay

## 2020-12-21 MED FILL — Metoprolol Tartrate Tab 25 MG: ORAL | 90 days supply | Qty: 270 | Fill #1 | Status: AC

## 2020-12-21 MED FILL — Amlodipine Besylate Tab 10 MG (Base Equivalent): ORAL | 90 days supply | Qty: 90 | Fill #1 | Status: AC

## 2021-01-09 DIAGNOSIS — Z23 Encounter for immunization: Secondary | ICD-10-CM | POA: Diagnosis not present

## 2021-01-10 ENCOUNTER — Other Ambulatory Visit (HOSPITAL_COMMUNITY): Payer: Self-pay

## 2021-01-20 MED FILL — Irbesartan-Hydrochlorothiazide Tab 300-12.5 MG: ORAL | 90 days supply | Qty: 90 | Fill #2 | Status: AC

## 2021-01-21 ENCOUNTER — Other Ambulatory Visit (HOSPITAL_COMMUNITY): Payer: Self-pay

## 2021-02-18 ENCOUNTER — Other Ambulatory Visit (HOSPITAL_COMMUNITY): Payer: Self-pay

## 2021-02-18 MED FILL — Simvastatin Tab 20 MG: ORAL | 90 days supply | Qty: 90 | Fill #2 | Status: AC

## 2021-02-26 ENCOUNTER — Ambulatory Visit: Payer: Self-pay | Admitting: Family Medicine

## 2021-03-05 ENCOUNTER — Encounter: Payer: Self-pay | Admitting: Family Medicine

## 2021-03-05 ENCOUNTER — Ambulatory Visit (INDEPENDENT_AMBULATORY_CARE_PROVIDER_SITE_OTHER): Payer: PPO | Admitting: Family Medicine

## 2021-03-05 ENCOUNTER — Other Ambulatory Visit: Payer: Self-pay

## 2021-03-05 VITALS — BP 160/80 | HR 78 | Temp 97.1°F | Ht 67.0 in | Wt 191.0 lb

## 2021-03-05 DIAGNOSIS — I25709 Atherosclerosis of coronary artery bypass graft(s), unspecified, with unspecified angina pectoris: Secondary | ICD-10-CM | POA: Diagnosis not present

## 2021-03-05 DIAGNOSIS — I480 Paroxysmal atrial fibrillation: Secondary | ICD-10-CM

## 2021-03-05 DIAGNOSIS — E7849 Other hyperlipidemia: Secondary | ICD-10-CM

## 2021-03-05 DIAGNOSIS — E118 Type 2 diabetes mellitus with unspecified complications: Secondary | ICD-10-CM | POA: Diagnosis not present

## 2021-03-05 NOTE — Progress Notes (Signed)
Provider:  Alain Honey, MD  Careteam: Patient Care Team: Maurice Small, MD (Inactive) as PCP - General (Family Medicine) Belva Crome, MD as PCP - Cardiology (Cardiology)  PLACE OF SERVICE:  [place] Directive information    Allergies  Allergen Reactions   Septra [Sulfamethoxazole-Trimethoprim] Rash    Chief Complaint  Patient presents with   New Patient (Initial Visit)    Patient presents today for a new patient appointment.      HPI: Patient is a 75 y.o. male friend is a new patient here having seen his wife several weeks ago.  They were formally patient's at [facility].  He has a history of type 2 diabetes hyperlipidemia hypertension and coronary artery disease.  He had CABG 5 years ago and is doing well.  Followed by Dr. Pernell Dupre cardiology. He has no symptoms or complaints today. He did have prostate issues with biopsy but no cancer.  He does not feel like that needs to be any longer tested.  Review of Systems:  Review of Systems  Constitutional: Negative.   HENT: Negative.    Respiratory: Negative.    Cardiovascular: Negative.   Gastrointestinal: Negative.   Musculoskeletal: Negative.   Neurological: Negative.   Psychiatric/Behavioral: Negative.    All other systems reviewed and are negative.  Past Medical History:  Diagnosis Date   BPH (benign prostatic hyperplasia)    [facility]   Coronary artery disease    a. Myoview 4/17: EF 47%, anteroseptal, apical anterior, apical septal, apical scar with peri-infarct ischemia, inferoseptal, inferior, inferolateral, apical inferior infarct with peri-infarct ischemia, high risk // b. LHC 4/17: Proximal RCA 99%, proximal LCx 100%, mid LAD 90%, distal LAD 60%, EF 55%   Diabetes mellitus    History of Doppler ultrasound    a. Carotid US 4/17:  bilateral ICA 1-39%   History of echocardiogram    a. Echo 4/17:  EF 60-65%, normal wall motion, grade 1 diastolic dysfunction   HLD (hyperlipidemia)     Hypertension    LBBB (left bundle branch block)    Nephrolithiasis    Past Surgical History:  Procedure Laterality Date   APPENDECTOMY  1976   CARDIAC CATHETERIZATION N/A 08/03/2015   Procedure: Left Heart Cath and Coronary Angiography;  Surgeon: Belva Crome, MD;  Location: [place] [facility];  Service: Cardiovascular;  Laterality: N/A;   COLONOSCOPY     CORONARY ARTERY BYPASS GRAFT N/A 08/13/2015   Procedure: CORONARY ARTERY BYPASS GRAFTING (CABG) x 4 using left internal mammary artery and right leg saphenous vein;  Surgeon: Gaye Pollack, MD;  Location: [place] [facility];  Service: Open Heart Surgery;  Laterality: N/A;   TEE WITHOUT CARDIOVERSION N/A 08/13/2015   Procedure: TRANSESOPHAGEAL ECHOCARDIOGRAM (TEE);  Surgeon: Gaye Pollack, MD;  Location: [place];  Service: Open Heart Surgery;  Laterality: N/A;   Social History:   reports that he has never smoked. He has never used smokeless tobacco. He reports that he does not drink alcohol and does not use drugs.  Family History  Problem Relation Age of Onset   Heart failure Mother 41   Heart disease Mother    Heart disease Father    Heart attack Father 23       died from MI at 50   Cancer Sister     Medications: Patient's Medications  New Prescriptions   No medications on file  Previous Medications   AMLODIPINE (NORVASC) 10 MG TABLET    TAKE 1  TABLET BY MOUTH DAILY.   ASPIRIN 81 MG TABLET    Take 81 mg by mouth daily.   CHOLECALCIFEROL (VITAMIN D) 2000 UNITS TABLET    Take 2,000 Units by mouth 3 (three) times a week.    CYANOCOBALAMIN (B-12) 2500 MCG TABS    Take 1 tablet by mouth 3 (three) times a week.   IRBESARTAN-HYDROCHLOROTHIAZIDE (AVALIDE) 300-12.5 MG TABLET    Take 1 tablet by mouth 2 (two) times daily.   METFORMIN (GLUCOPHAGE-XR) 500 MG 24 HR TABLET    Take 500 mg by mouth daily.   METOPROLOL TARTRATE (LOPRESSOR) 25 MG TABLET    TAKE 1 & 1/2 TABLETS BY MOUTH TWICE DAILY.   SIMVASTATIN (ZOCOR) 20 MG TABLET    Take 20 mg by mouth  daily.  Modified Medications   No medications on file  Discontinued Medications   ZOSTER VACCINE ADJUVANTED (SHINGRIX) INJECTION    Inject 0.5 mLs into the muscle.    Physical Exam:  Vitals:   03/05/21 1402  BP: (!) 160/80  Pulse: 78  Temp: (!) 97.1 F (36.2 C)  SpO2: 98%  Weight: 191 lb (86.6 kg)  Height: 5' 7"  (1.702 m)   Body mass index is 29.91 kg/m. Wt Readings from Last 3 Encounters:  03/05/21 191 lb (86.6 kg)  06/26/20 189 lb 3.2 oz (85.8 kg)  05/04/19 192 lb 12.8 oz (87.5 kg)    Physical Exam Vitals and nursing note reviewed.  Constitutional:      Appearance: Normal appearance.  HENT:     Head: Normocephalic.     Right Ear: Tympanic membrane normal.     Left Ear: Tympanic membrane normal.     Mouth/Throat:     Mouth: Mucous membranes are moist.     Pharynx: Oropharynx is clear.  Eyes:     Extraocular Movements: Extraocular movements intact.     Pupils: Pupils are equal, round, and reactive to light.  Cardiovascular:     Rate and Rhythm: Normal rate and regular rhythm.     Pulses: Normal pulses.     Heart sounds: Normal heart sounds.  Pulmonary:     Effort: Pulmonary effort is normal.  Abdominal:     General: Abdomen is flat.     Palpations: Abdomen is soft.  Musculoskeletal:        General: Normal range of motion.  Skin:    General: Skin is warm and dry.  Neurological:     General: No focal deficit present.     Mental Status: He is alert and oriented to person, place, and time.    Labs reviewed: Basic Metabolic Panel: No results for input(s): NA, K, CL, CO2, GLUCOSE, BUN, CREATININE, CALCIUM, MG, PHOS, TSH in the last 8760 hours. Liver Function Tests: No results for input(s): AST, ALT, ALKPHOS, BILITOT, PROT, ALBUMIN in the last 8760 hours. No results for input(s): LIPASE, AMYLASE in the last 8760 hours. No results for input(s): AMMONIA in the last 8760 hours. CBC: No results for input(s): WBC, NEUTROABS, HGB, HCT, MCV, PLT in the last 8760  hours. Lipid Panel: No results for input(s): CHOL, HDL, LDLCALC, TRIG, CHOLHDL, LDLDIRECT in the last 8760 hours. TSH: No results for input(s): TSH in the last 8760 hours. A1C: Lab Results  Component Value Date   HGBA1C 7.1 (H) 08/10/2015     Assessment/Plan  1. Diabetes mellitus type 2 with complications (HCC) Last A1c was 6.9 back in April.  He continues to take metformin 500 mg and the extended release  formulation - CMP with eGFR([facility]) - Hemoglobin A1c  2. Other hyperlipidemia Does not recall recent lipid check and I have no record of that since he was seeing [facility] physicians plan to update today - Lipid Panel  3. Paroxysmal atrial fibrillation (HCC) Atrial fib developed after the CABG.  He takes metoprolol 1-1/2 tablets (25 mg) twice daily.  I believe he is in sinus rhythm today  4. Coronary artery disease involving coronary bypass graft of native heart with angina pectoris ([place]) Status post CABG.  No pain.  Blood pressure is usually controlled with combination calcium channel blocker, ARB, and beta-blocker   Alain Honey, MD [place] [phone]

## 2021-03-06 ENCOUNTER — Telehealth: Payer: PPO | Admitting: Adult Health

## 2021-03-06 LAB — COMPLETE METABOLIC PANEL WITH GFR
AG Ratio: 1.8 (calc) (ref 1.0–2.5)
ALT: 38 U/L (ref 9–46)
AST: 28 U/L (ref 10–35)
Albumin: 5 g/dL (ref 3.6–5.1)
Alkaline phosphatase (APISO): 81 U/L (ref 35–144)
BUN/Creatinine Ratio: 16 (calc) (ref 6–22)
BUN: 22 mg/dL (ref 7–25)
CO2: 27 mmol/L (ref 20–32)
Calcium: 11 mg/dL — ABNORMAL HIGH (ref 8.6–10.3)
Chloride: 98 mmol/L (ref 98–110)
Creat: 1.4 mg/dL — ABNORMAL HIGH (ref 0.70–1.28)
Globulin: 2.8 g/dL (calc) (ref 1.9–3.7)
Glucose, Bld: 172 mg/dL — ABNORMAL HIGH (ref 65–139)
Potassium: 4.5 mmol/L (ref 3.5–5.3)
Sodium: 136 mmol/L (ref 135–146)
Total Bilirubin: 0.9 mg/dL (ref 0.2–1.2)
Total Protein: 7.8 g/dL (ref 6.1–8.1)
eGFR: 53 mL/min/{1.73_m2} — ABNORMAL LOW (ref >=60)

## 2021-03-06 LAB — LIPID PANEL
Cholesterol: 156 mg/dL (ref <200)
HDL: 39 mg/dL — ABNORMAL LOW (ref >=40)
LDL Cholesterol (Calc): 84 mg/dL (calc)
Non-HDL Cholesterol (Calc): 117 mg/dL (calc) (ref <130)
Total CHOL/HDL Ratio: 4 (calc) (ref <5.0)
Triglycerides: 251 mg/dL — ABNORMAL HIGH (ref <150)

## 2021-03-06 LAB — HEMOGLOBIN A1C
Hgb A1c MFr Bld: 7.4 % of total Hgb — ABNORMAL HIGH (ref <5.7)
Mean Plasma Glucose: 166 mg/dL
eAG (mmol/L): 9.2 mmol/L

## 2021-03-24 MED FILL — Amlodipine Besylate Tab 10 MG (Base Equivalent): ORAL | 90 days supply | Qty: 90 | Fill #2 | Status: AC

## 2021-03-24 MED FILL — Metoprolol Tartrate Tab 25 MG: ORAL | 90 days supply | Qty: 270 | Fill #2 | Status: AC

## 2021-03-25 ENCOUNTER — Other Ambulatory Visit (HOSPITAL_COMMUNITY): Payer: Self-pay

## 2021-03-27 ENCOUNTER — Other Ambulatory Visit (HOSPITAL_COMMUNITY): Payer: Self-pay

## 2021-04-01 ENCOUNTER — Other Ambulatory Visit (HOSPITAL_COMMUNITY): Payer: Self-pay

## 2021-04-08 ENCOUNTER — Other Ambulatory Visit: Payer: Self-pay | Admitting: Family Medicine

## 2021-04-08 ENCOUNTER — Other Ambulatory Visit (HOSPITAL_COMMUNITY): Payer: Self-pay

## 2021-04-08 MED ORDER — METFORMIN HCL ER 500 MG PO TB24
1000.0000 mg | ORAL_TABLET | Freq: Every day | ORAL | 1 refills | Status: DC
  Filled 2021-04-08: qty 180, 90d supply, fill #0
  Filled 2021-07-03: qty 180, 90d supply, fill #1

## 2021-04-22 ENCOUNTER — Other Ambulatory Visit: Payer: Self-pay | Admitting: Family Medicine

## 2021-04-22 ENCOUNTER — Other Ambulatory Visit (HOSPITAL_COMMUNITY): Payer: Self-pay

## 2021-04-22 MED ORDER — IRBESARTAN-HYDROCHLOROTHIAZIDE 300-12.5 MG PO TABS
1.0000 | ORAL_TABLET | Freq: Every day | ORAL | 3 refills | Status: DC
  Filled 2021-04-26: qty 90, 90d supply, fill #0
  Filled 2021-07-26: qty 90, 90d supply, fill #1

## 2021-04-26 ENCOUNTER — Other Ambulatory Visit (HOSPITAL_COMMUNITY): Payer: Self-pay

## 2021-04-29 ENCOUNTER — Other Ambulatory Visit (HOSPITAL_COMMUNITY): Payer: Self-pay

## 2021-05-08 ENCOUNTER — Other Ambulatory Visit (HOSPITAL_COMMUNITY): Payer: Self-pay

## 2021-05-23 ENCOUNTER — Other Ambulatory Visit (HOSPITAL_COMMUNITY): Payer: Self-pay

## 2021-05-23 ENCOUNTER — Other Ambulatory Visit: Payer: Self-pay | Admitting: Family Medicine

## 2021-05-24 ENCOUNTER — Other Ambulatory Visit (HOSPITAL_COMMUNITY): Payer: Self-pay

## 2021-05-27 ENCOUNTER — Other Ambulatory Visit: Payer: Self-pay | Admitting: Family Medicine

## 2021-05-27 ENCOUNTER — Other Ambulatory Visit (HOSPITAL_COMMUNITY): Payer: Self-pay

## 2021-05-28 ENCOUNTER — Other Ambulatory Visit (HOSPITAL_COMMUNITY): Payer: Self-pay

## 2021-05-28 ENCOUNTER — Other Ambulatory Visit: Payer: Self-pay | Admitting: *Deleted

## 2021-05-28 MED ORDER — SIMVASTATIN 20 MG PO TABS
20.0000 mg | ORAL_TABLET | Freq: Every day | ORAL | 1 refills | Status: DC
  Filled 2021-05-28: qty 90, 90d supply, fill #0
  Filled 2021-08-20: qty 90, 90d supply, fill #1

## 2021-05-28 NOTE — Telephone Encounter (Signed)
Patient wife requested refill. Stated that it was Denied and she didn't know why.  Medication refilled.

## 2021-06-19 ENCOUNTER — Other Ambulatory Visit: Payer: Self-pay | Admitting: Interventional Cardiology

## 2021-06-19 ENCOUNTER — Other Ambulatory Visit (HOSPITAL_COMMUNITY): Payer: Self-pay

## 2021-06-19 DIAGNOSIS — I480 Paroxysmal atrial fibrillation: Secondary | ICD-10-CM

## 2021-06-19 MED ORDER — AMLODIPINE BESYLATE 10 MG PO TABS
10.0000 mg | ORAL_TABLET | Freq: Every day | ORAL | 0 refills | Status: DC
  Filled 2021-06-19: qty 90, 90d supply, fill #0

## 2021-06-19 MED ORDER — METOPROLOL TARTRATE 25 MG PO TABS
37.5000 mg | ORAL_TABLET | Freq: Two times a day (BID) | ORAL | 0 refills | Status: DC
  Filled 2021-06-19: qty 270, 90d supply, fill #0

## 2021-07-03 ENCOUNTER — Other Ambulatory Visit (HOSPITAL_COMMUNITY): Payer: Self-pay

## 2021-07-16 DIAGNOSIS — H3554 Dystrophies primarily involving the retinal pigment epithelium: Secondary | ICD-10-CM | POA: Diagnosis not present

## 2021-07-16 DIAGNOSIS — H35371 Puckering of macula, right eye: Secondary | ICD-10-CM | POA: Diagnosis not present

## 2021-07-16 DIAGNOSIS — H02834 Dermatochalasis of left upper eyelid: Secondary | ICD-10-CM | POA: Diagnosis not present

## 2021-07-16 DIAGNOSIS — E119 Type 2 diabetes mellitus without complications: Secondary | ICD-10-CM | POA: Diagnosis not present

## 2021-07-16 DIAGNOSIS — H2512 Age-related nuclear cataract, left eye: Secondary | ICD-10-CM | POA: Diagnosis not present

## 2021-07-16 DIAGNOSIS — H25811 Combined forms of age-related cataract, right eye: Secondary | ICD-10-CM | POA: Diagnosis not present

## 2021-07-16 DIAGNOSIS — H02831 Dermatochalasis of right upper eyelid: Secondary | ICD-10-CM | POA: Diagnosis not present

## 2021-07-26 ENCOUNTER — Other Ambulatory Visit (HOSPITAL_COMMUNITY): Payer: Self-pay

## 2021-08-12 NOTE — Progress Notes (Signed)
?Cardiology Office Note:   ? ?Date:  08/14/2021  ? ?ID:  [name] [name], DOB [birth year], MRN [id number] ? ?PCP:  [name] Honour, MD  ?Cardiologist:  [name] Grooms, MD  ? ?Referring MD: No ref. provider found  ? ?Chief Complaint  ?Patient presents with  ? Coronary Artery Disease  ? Hypertension  ? Hyperlipidemia  ? ? ?History of Present Illness:   ? ?[name] [name] is a 76 y.o. male with a hx of  CAD s/p CABG with LIMA-LAD, SVG-PDA/PL 1, SVG-OM.2017, LBBB, HTN, HL, DM2, postoperative paroxysmal atrial fibrillation and previous amiodarone therapy. Coumadin therapy has been discontinued.. ?  ? ? ?He is asymptomatic.  He has a primary care physician coming up with [name] [name] in May.  We did review lipid levels and his most recent LDL was 84.  The most recent consensus study for LDL target in [place] is recommended to be less than 55 in patients with high risk cardiovascular conditions such as prior MI and coronary bypass surgery.  I have recommended that we intensify therapy.  He is on Zocor 20 mg/day and I would recommend switching to rosuvastatin 20 mg/day. ? ?Past Medical History:  ?Diagnosis Date  ? BPH (benign prostatic hyperplasia)   ? [facility]  ? Coronary artery disease   ? a. Myoview 4/17: EF 47%, anteroseptal, apical anterior, apical septal, apical scar with peri-infarct ischemia, inferoseptal, inferior, inferolateral, apical inferior infarct with peri-infarct ischemia, high risk // b. LHC 4/17: Proximal RCA 99%, proximal LCx 100%, mid LAD 90%, distal LAD 60%, EF 55%  ? Diabetes mellitus   ? History of Doppler ultrasound   ? a. Carotid US 4/17:  bilateral ICA 1-39%  ? History of echocardiogram   ? a. Echo 4/17:  EF 60-65%, normal wall motion, grade 1 diastolic dysfunction  ? HLD (hyperlipidemia)   ? Hypertension   ? LBBB (left bundle branch block)   ? Nephrolithiasis   ? ? ?Past Surgical History:  ?Procedure Laterality Date  ? APPENDECTOMY  1976  ? CARDIAC CATHETERIZATION N/A 08/03/2015  ?  Procedure: Left Heart Cath and Coronary Angiography;  Surgeon: Belva Crome, MD;  Location: [facility];  Service: Cardiovascular;  Laterality: N/A;  ? COLONOSCOPY    ? CORONARY ARTERY BYPASS GRAFT N/A 08/13/2015  ? Procedure: CORONARY ARTERY BYPASS GRAFTING (CABG) x 4 using left internal mammary artery and right leg saphenous vein;  Surgeon: Gaye Pollack, MD;  Location: [facility];  Service: Open Heart Surgery;  Laterality: N/A;  ? TEE WITHOUT CARDIOVERSION N/A 08/13/2015  ? Procedure: TRANSESOPHAGEAL ECHOCARDIOGRAM (TEE);  Surgeon: Gaye Pollack, MD;  Location: [facility];  Service: Open Heart Surgery;  Laterality: N/A;  ? ? ?Current Medications: ?Current Meds  ?Medication Sig  ? amLODipine (NORVASC) 10 MG tablet Take 1 tablet (10 mg total) by mouth daily.  ? aspirin 81 MG tablet Take 81 mg by mouth daily.  ? Cholecalciferol (VITAMIN D) 2000 units tablet Take 2,000 Units by mouth 3 (three) times a week.   ? Cyanocobalamin (B-12) 2500 MCG TABS Take 1 tablet by mouth 3 (three) times a week.  ? irbesartan-hydrochlorothiazide (AVALIDE) 300-12.5 MG tablet Take 1 tablet by mouth 2 (two) times daily.  ? metFORMIN (GLUCOPHAGE-XR) 500 MG 24 hr tablet Take 2 tablets (1,000 mg total) by mouth daily.  ? metoprolol tartrate (LOPRESSOR) 25 MG tablet Take 1.5 tablets (37.5 mg total) by mouth 2 (two) times daily. Please keep upcoming appt in  April 2023 with Dr. Tamala [name] before anymore refills. Thank you Final Attempt  ? simvastatin (ZOCOR) 20 MG tablet Take 1 tablet (20 mg total) by mouth daily.  ? Zoster Vaccine Adjuvanted [facility]) injection Inject 0.5 mLs into the muscle.  ?  ? ?Allergies:   Septra [sulfamethoxazole-trimethoprim]  ? ?Social History  ? ?Socioeconomic History  ? Marital status: Married  ?  Spouse name: Not on file  ? Number of children: Not on file  ? Years of education: Not on file  ? Highest education level: Not on file  ?Occupational History  ? Not on file  ?Tobacco Use  ? Smoking status: Never  ? Smokeless  tobacco: Never  ?Vaping Use  ? Vaping Use: Never used  ?Substance and Sexual Activity  ? Alcohol use: No  ? Drug use: Never  ? Sexual activity: Not on file  ?Other Topics Concern  ? Not on file  ?Social History Narrative  ? Retired  ? Married (1987); 1 stepdaughter  ? [employer] delivery x 30 years  ? Originally from [place]  ? Diet: "Nothing special"  ? Patient does consume drinks/foods with caffeine: coffee, tea, soda.  ? Patient lives in a house with one other.   ? Patient does some exercise - yard and house work.   ? Patient does have living will, DNR, and HCPOA  ? Patient has no difficulty bathing, dressing, preparing/eating foods, managing medications, managing finances or affording medications.   ? ?Social Determinants of Health  ? ?Financial Resource Strain: Not on file  ?Food Insecurity: Not on file  ?Transportation Needs: Not on file  ?Physical Activity: Not on file  ?Stress: Not on file  ?Social Connections: Not on file  ?  ? ?Family History: ?The patient's family history includes Cancer in his sister; Heart attack (age of onset: 88) in his father; Heart disease in his father and mother; Heart failure (age of onset: 31) in his mother. ? ?ROS:   ?Please see the history of present illness.    ?[place] active.  He does chores around the house.  He plays pickle ball twice per week for up to 2 or 3 hours.  No complaints of chest discomfort.  All other systems reviewed and are negative. ? ?EKGs/Labs/Other Studies Reviewed:   ? ?The following studies were reviewed today: ?No new imaging ? ?EKG:  EKG normal sinus rhythm, 62 bpm.  Normal PR interval, left bundle with QRS D1 154 ms.  When compared to the prior tracing from 06/26/2020, changes noted. ? ?Recent Labs: ?03/05/2021: ALT 38; BUN 22; Creat 1.40; Potassium 4.5; Sodium 136  ?Recent Lipid Panel ?   ?Component Value Date/Time  ? CHOL 156 03/05/2021 1445  ? CHOL 136 08/18/2016 0820  ? TRIG 251 (H) 03/05/2021 1445  ? HDL 39 (L) 03/05/2021 1445  ? HDL 34  (L) 08/18/2016 0820  ? CHOLHDL 4.0 03/05/2021 1445  ? [place] 84 03/05/2021 1445  ? ? ?Physical Exam:   ? ?VS:  BP (!) 142/64   Pulse 62   Ht '5\' 7"'$  (1.702 m)   Wt 186 lb 6.4 oz (84.6 kg)   SpO2 96%   BMI 29.19 kg/m?    ? ?Wt Readings from Last 3 Encounters:  ?08/14/21 186 lb 6.4 oz (84.6 kg)  ?03/05/21 191 lb (86.6 kg)  ?06/26/20 189 lb 3.2 oz (85.8 kg)  ?  ? ?GEN: Compatible with age, bearded.. No acute distress ?HEENT: Normal ?NECK: No JVD. ?LYMPHATICS: No lymphadenopathy ?CARDIAC: No murmur.  RRR no gallop, or edema. ?VASCULAR:  Normal Pulses but unable to feel pedal pulses through thick socks. No bruits. ?RESPIRATORY:  Clear to auscultation without rales, wheezing or rhonchi  ?ABDOMEN: Soft, non-tender, non-distended, No pulsatile mass, ?MUSCULOSKELETAL: No deformity  ?SKIN: Warm and dry.  Has multiple papules along the sternal incision line.  One is particularly larger than the others at the superior margin of the incision. ?NEUROLOGIC:  Alert and oriented x 3 ?PSYCHIATRIC:  Normal affect  ? ?ASSESSMENT:   ? ?1. Coronary artery disease involving coronary bypass graft of native heart with angina pectoris ([place])   ?2. Paroxysmal atrial fibrillation ([place])   ?3. Diabetes mellitus type 2 with complications ([place])   ?4. Other hyperlipidemia   ?5. Essential hypertension   ? ?PLAN:   ? ?In order of problems listed above: ? ?Secondary prevention reviewed as noted below ?No clinical recurrences of atrial fibrillation. ?Most recent hemoglobin A1c was 7.2.  I would recommend considering the addition of SGLT2 therapy in the form of Jardiance or Farxiga.  In addition to better controlling the A1c it will give cardioprotection. ?Increase intensity of statin therapy to get LDL target of 55 which is a new consensus statement target for people with high risk of vascular disease such as [name] [name].  My recommendation would be 20 mg of rosuvastatin daily. ?Target blood pressure 130/80.  Continue current therapy. ? ?Overall  education and awareness concerning secondary risk prevention was discussed in detail: LDL less than 70, hemoglobin A1c less than 7, blood pressure target less than 130/80 mmHg, >150 minutes of moderate aerobic activity

## 2021-08-14 ENCOUNTER — Ambulatory Visit: Payer: PPO | Admitting: Interventional Cardiology

## 2021-08-14 ENCOUNTER — Encounter: Payer: Self-pay | Admitting: Interventional Cardiology

## 2021-08-14 VITALS — BP 142/64 | HR 62 | Ht 67.0 in | Wt 186.4 lb

## 2021-08-14 DIAGNOSIS — E7849 Other hyperlipidemia: Secondary | ICD-10-CM | POA: Diagnosis not present

## 2021-08-14 DIAGNOSIS — I480 Paroxysmal atrial fibrillation: Secondary | ICD-10-CM

## 2021-08-14 DIAGNOSIS — I25709 Atherosclerosis of coronary artery bypass graft(s), unspecified, with unspecified angina pectoris: Secondary | ICD-10-CM

## 2021-08-14 DIAGNOSIS — E118 Type 2 diabetes mellitus with unspecified complications: Secondary | ICD-10-CM | POA: Diagnosis not present

## 2021-08-14 DIAGNOSIS — I10 Essential (primary) hypertension: Secondary | ICD-10-CM | POA: Diagnosis not present

## 2021-08-14 NOTE — Patient Instructions (Signed)
Medication Instructions:  ?Your physician recommends that you continue on your current medications as directed. Please refer to the Current Medication list given to you today. ? ?*If you need a refill on your cardiac medications before your next appointment, please call your pharmacy* ? ?Lab Work: ?NONE ? ?Testing/Procedures: ?NONE ? ?Follow-Up: ?At [facility], you and your health needs are our priority.  As part of our continuing mission to provide you with exceptional heart care, we have created designated Provider Care Teams.  These Care Teams include your primary Cardiologist (physician) and Advanced Practice Providers (APPs -  Physician Assistants and Nurse Practitioners) who all work together to provide you with the care you need, when you need it. ? ?Your next appointment:   ?12 month(s) ? ?The format for your next appointment:   ?In Person ? ?Provider:   ?Sinclair Grooms, MD { ? ? ?Important Information About Sugar ? ? ? ? ?  ?

## 2021-08-20 ENCOUNTER — Other Ambulatory Visit (HOSPITAL_COMMUNITY): Payer: Self-pay

## 2021-09-08 ENCOUNTER — Other Ambulatory Visit: Payer: Self-pay | Admitting: Interventional Cardiology

## 2021-09-09 ENCOUNTER — Other Ambulatory Visit (HOSPITAL_COMMUNITY): Payer: Self-pay

## 2021-09-09 MED ORDER — AMLODIPINE BESYLATE 10 MG PO TABS
10.0000 mg | ORAL_TABLET | Freq: Every day | ORAL | 2 refills | Status: DC
  Filled 2021-09-09: qty 90, 90d supply, fill #0
  Filled 2021-12-24: qty 90, 90d supply, fill #1
  Filled 2022-03-17: qty 90, 90d supply, fill #2

## 2021-09-10 ENCOUNTER — Ambulatory Visit (INDEPENDENT_AMBULATORY_CARE_PROVIDER_SITE_OTHER): Payer: PPO | Admitting: Family Medicine

## 2021-09-10 ENCOUNTER — Other Ambulatory Visit (HOSPITAL_COMMUNITY): Payer: Self-pay

## 2021-09-10 ENCOUNTER — Encounter: Payer: Self-pay | Admitting: Family Medicine

## 2021-09-10 VITALS — BP 138/88 | HR 69 | Temp 97.3°F | Ht 67.0 in | Wt 186.5 lb

## 2021-09-10 DIAGNOSIS — E7849 Other hyperlipidemia: Secondary | ICD-10-CM

## 2021-09-10 DIAGNOSIS — E118 Type 2 diabetes mellitus with unspecified complications: Secondary | ICD-10-CM | POA: Diagnosis not present

## 2021-09-10 DIAGNOSIS — L821 Other seborrheic keratosis: Secondary | ICD-10-CM | POA: Diagnosis not present

## 2021-09-10 DIAGNOSIS — L814 Other melanin hyperpigmentation: Secondary | ICD-10-CM | POA: Diagnosis not present

## 2021-09-10 DIAGNOSIS — I480 Paroxysmal atrial fibrillation: Secondary | ICD-10-CM | POA: Diagnosis not present

## 2021-09-10 DIAGNOSIS — I10 Essential (primary) hypertension: Secondary | ICD-10-CM

## 2021-09-10 DIAGNOSIS — L578 Other skin changes due to chronic exposure to nonionizing radiation: Secondary | ICD-10-CM | POA: Diagnosis not present

## 2021-09-10 DIAGNOSIS — D225 Melanocytic nevi of trunk: Secondary | ICD-10-CM | POA: Diagnosis not present

## 2021-09-10 MED ORDER — ROSUVASTATIN CALCIUM 20 MG PO TABS
20.0000 mg | ORAL_TABLET | Freq: Every day | ORAL | 3 refills | Status: DC
  Filled 2021-09-10: qty 90, 90d supply, fill #0
  Filled 2022-02-25: qty 90, 90d supply, fill #1
  Filled 2022-05-22: qty 90, 90d supply, fill #2
  Filled 2022-08-19: qty 90, 90d supply, fill #3

## 2021-09-10 NOTE — Progress Notes (Signed)
Provider:  Alain Honey, MD  Careteam: Patient Care Team: Wardell Honour, MD as PCP - General (Family Medicine) Belva Crome, MD as PCP - Cardiology (Cardiology)  PLACE OF SERVICE:  [place] Directive information    Allergies  Allergen Reactions   Septra [Sulfamethoxazole-Trimethoprim] Rash    Chief Complaint  Patient presents with   Medical Management of Chronic Issues    Patient presents today for a 6 month follow-up.   Quality Metric Gaps    Foot & eye exam, colonoscopy, A1C, Hep C screening, COVID booster #4     HPI: Patient is a 76 y.o. male patient is here for routine follow-up of chronic problems including type 2 diabetes, hyperlipidemia, hypertension, and coronary artery disease.  Since his last visit here he has seen cardiologist who had recommended more aggressive management of his LDL cholesterol.  Toward that end.  I will begin more potent statin, Crestor. Blood pressures have been well controlled.  At cardiology office pressure was 142/64, but it is a bit higher today here at 138/88. A1c was last assessed 6 months ago and was 7.4. There are no new symptoms or complaints today  Review of Systems:  Review of Systems  Constitutional: Negative.   Respiratory: Negative.    Cardiovascular: Negative.   Gastrointestinal: Negative.   Genitourinary: Negative.   Musculoskeletal: Negative.   Neurological: Negative.   Psychiatric/Behavioral: Negative.    All other systems reviewed and are negative.  Past Medical History:  Diagnosis Date   BPH (benign prostatic hyperplasia)    [facility]   Coronary artery disease    a. Myoview 4/17: EF 47%, anteroseptal, apical anterior, apical septal, apical scar with peri-infarct ischemia, inferoseptal, inferior, inferolateral, apical inferior infarct with peri-infarct ischemia, high risk // b. LHC 4/17: Proximal RCA 99%, proximal LCx 100%, mid LAD 90%, distal LAD 60%, EF 55%   Diabetes mellitus     History of Doppler ultrasound    a. Carotid US 4/17:  bilateral ICA 1-39%   History of echocardiogram    a. Echo 4/17:  EF 60-65%, normal wall motion, grade 1 diastolic dysfunction   HLD (hyperlipidemia)    Hypertension    LBBB (left bundle branch block)    Nephrolithiasis    Past Surgical History:  Procedure Laterality Date   APPENDECTOMY  1976   CARDIAC CATHETERIZATION N/A 08/03/2015   Procedure: Left Heart Cath and Coronary Angiography;  Surgeon: Belva Crome, MD;  Location: [facility];  Service: Cardiovascular;  Laterality: N/A;   COLONOSCOPY     CORONARY ARTERY BYPASS GRAFT N/A 08/13/2015   Procedure: CORONARY ARTERY BYPASS GRAFTING (CABG) x 4 using left internal mammary artery and right leg saphenous vein;  Surgeon: Gaye Pollack, MD;  Location: [facility];  Service: Open Heart Surgery;  Laterality: N/A;   TEE WITHOUT CARDIOVERSION N/A 08/13/2015   Procedure: TRANSESOPHAGEAL ECHOCARDIOGRAM (TEE);  Surgeon: Gaye Pollack, MD;  Location: [facility];  Service: Open Heart Surgery;  Laterality: N/A;   Social History:   reports that he has never smoked. He has never used smokeless tobacco. He reports that he does not drink alcohol and does not use drugs.  Family History  Problem Relation Age of Onset   Heart failure Mother 53   Heart disease Mother    Heart disease Father    Heart attack Father 62       died from MI at 41   Cancer Sister  Medications: Patient's Medications  New Prescriptions   No medications on file  Previous Medications   AMLODIPINE (NORVASC) 10 MG TABLET    Take 1 tablet (10 mg total) by mouth daily.   ASPIRIN 81 MG TABLET    Take 81 mg by mouth daily.   CHOLECALCIFEROL (VITAMIN D) 2000 UNITS TABLET    Take 2,000 Units by mouth 3 (three) times a week.    CYANOCOBALAMIN (B-12) 2500 MCG TABS    Take 1 tablet by mouth 3 (three) times a week.   IRBESARTAN-HYDROCHLOROTHIAZIDE (AVALIDE) 300-12.5 MG TABLET    Take 1 tablet by mouth 2 (two) times daily.    METFORMIN (GLUCOPHAGE-XR) 500 MG 24 HR TABLET    Take 2 tablets (1,000 mg total) by mouth daily.   METOPROLOL TARTRATE (LOPRESSOR) 25 MG TABLET    Take 1.5 tablets (37.5 mg total) by mouth 2 (two) times daily. Please keep upcoming appt in April 2023 with Dr. Tamala Julian before anymore refills. Thank you Final Attempt   SIMVASTATIN (ZOCOR) 20 MG TABLET    Take 1 tablet (20 mg total) by mouth daily.   ZOSTER VACCINE ADJUVANTED [facility]) INJECTION    Inject 0.5 mLs into the muscle.  Modified Medications   No medications on file  Discontinued Medications   No medications on file    Physical Exam:  Vitals:   09/10/21 0945  BP: 138/88  Pulse: 69  Temp: (!) 97.3 F (36.3 C)  SpO2: 97%  Weight: 186 lb 8 oz (84.6 kg)  Height: '5\' 7"'$  (1.702 m)   Body mass index is 29.21 kg/m. Wt Readings from Last 3 Encounters:  09/10/21 186 lb 8 oz (84.6 kg)  08/14/21 186 lb 6.4 oz (84.6 kg)  03/05/21 191 lb (86.6 kg)    Physical Exam Vitals and nursing note reviewed.  Constitutional:      Appearance: Normal appearance.  Cardiovascular:     Rate and Rhythm: Normal rate and regular rhythm.     Pulses: Normal pulses.  Pulmonary:     Effort: Pulmonary effort is normal.     Breath sounds: Normal breath sounds.  Neurological:     Mental Status: He is alert.    Labs reviewed: Basic Metabolic Panel: Recent Labs    03/05/21 1445  NA 136  K 4.5  CL 98  CO2 27  GLUCOSE 172*  BUN 22  CREATININE 1.40*  CALCIUM 11.0*   Liver Function Tests: Recent Labs    03/05/21 1445  AST 28  ALT 38  BILITOT 0.9  PROT 7.8   No results for input(s): LIPASE, AMYLASE in the last 8760 hours. No results for input(s): AMMONIA in the last 8760 hours. CBC: No results for input(s): WBC, NEUTROABS, HGB, HCT, MCV, PLT in the last 8760 hours. Lipid Panel: Recent Labs    03/05/21 1445  CHOL 156  HDL 39*  LDLCALC 84  TRIG 251*  CHOLHDL 4.0   TSH: No results for input(s): TSH in the last 8760  hours. A1C: Lab Results  Component Value Date   HGBA1C 7.4 (H) 03/05/2021     Assessment/Plan  1. Other hyperlipidemia Last LDL was 84.  Goal is now 55 or lower.  For that reason we will discontinue simvastatin and begin rosuvastatin 20 mg and recheck in 2 to 3 months  2. Diabetes mellitus type 2 with complications ([place]) Continues with metformin 1000 mg twice daily.  Need to reassess A1c today  3. Paroxysmal atrial fibrillation (HCC) Denies palpitations or chest pain.  Anticoagulant has been stopped.  Continues on rate controlling beta-blocker, metoprolol  4. Essential hypertension Blood pressure well controlled on combination calcium channel blocker beta-blocker and ARB   Alain Honey, MD [facility] [phone]

## 2021-09-11 LAB — HEMOGLOBIN A1C
Hgb A1c MFr Bld: 6.9 % of total Hgb — ABNORMAL HIGH (ref <5.7)
Mean Plasma Glucose: 151 mg/dL
eAG (mmol/L): 8.4 mmol/L

## 2021-09-19 ENCOUNTER — Other Ambulatory Visit: Payer: Self-pay | Admitting: Interventional Cardiology

## 2021-09-19 DIAGNOSIS — I480 Paroxysmal atrial fibrillation: Secondary | ICD-10-CM

## 2021-09-20 ENCOUNTER — Other Ambulatory Visit (HOSPITAL_COMMUNITY): Payer: Self-pay

## 2021-09-20 MED ORDER — METOPROLOL TARTRATE 25 MG PO TABS
37.5000 mg | ORAL_TABLET | Freq: Two times a day (BID) | ORAL | 3 refills | Status: DC
  Filled 2021-09-20: qty 270, 90d supply, fill #0
  Filled 2021-12-08: qty 270, 90d supply, fill #1
  Filled 2022-03-17: qty 270, 90d supply, fill #2
  Filled 2022-06-15: qty 270, 90d supply, fill #3

## 2021-10-02 ENCOUNTER — Other Ambulatory Visit: Payer: Self-pay | Admitting: Family Medicine

## 2021-10-02 ENCOUNTER — Other Ambulatory Visit (HOSPITAL_COMMUNITY): Payer: Self-pay

## 2021-10-02 MED ORDER — METFORMIN HCL ER 500 MG PO TB24
1000.0000 mg | ORAL_TABLET | Freq: Every day | ORAL | 1 refills | Status: DC
Start: 2021-10-02 — End: 2022-03-31
  Filled 2021-10-02: qty 180, 90d supply, fill #0
  Filled 2022-01-05: qty 180, 90d supply, fill #1

## 2021-10-28 ENCOUNTER — Other Ambulatory Visit (HOSPITAL_COMMUNITY): Payer: Self-pay

## 2021-10-28 ENCOUNTER — Other Ambulatory Visit: Payer: Self-pay | Admitting: Family Medicine

## 2021-10-28 MED ORDER — IRBESARTAN-HYDROCHLOROTHIAZIDE 300-12.5 MG PO TABS
1.0000 | ORAL_TABLET | Freq: Every day | ORAL | 1 refills | Status: DC
  Filled 2021-10-28: qty 90, 90d supply, fill #0
  Filled 2022-01-19: qty 90, 90d supply, fill #1

## 2021-11-14 DIAGNOSIS — H6123 Impacted cerumen, bilateral: Secondary | ICD-10-CM | POA: Diagnosis not present

## 2021-11-14 DIAGNOSIS — H93293 Other abnormal auditory perceptions, bilateral: Secondary | ICD-10-CM | POA: Diagnosis not present

## 2021-12-09 ENCOUNTER — Other Ambulatory Visit (HOSPITAL_COMMUNITY): Payer: Self-pay

## 2021-12-13 DIAGNOSIS — H903 Sensorineural hearing loss, bilateral: Secondary | ICD-10-CM | POA: Diagnosis not present

## 2021-12-24 ENCOUNTER — Other Ambulatory Visit (HOSPITAL_COMMUNITY): Payer: Self-pay

## 2022-01-06 ENCOUNTER — Other Ambulatory Visit (HOSPITAL_COMMUNITY): Payer: Self-pay

## 2022-01-20 ENCOUNTER — Other Ambulatory Visit (HOSPITAL_COMMUNITY): Payer: Self-pay

## 2022-01-29 ENCOUNTER — Ambulatory Visit: Payer: PPO

## 2022-02-05 ENCOUNTER — Ambulatory Visit (INDEPENDENT_AMBULATORY_CARE_PROVIDER_SITE_OTHER): Payer: PPO

## 2022-02-05 ENCOUNTER — Other Ambulatory Visit (HOSPITAL_BASED_OUTPATIENT_CLINIC_OR_DEPARTMENT_OTHER): Payer: Self-pay

## 2022-02-05 DIAGNOSIS — Z23 Encounter for immunization: Secondary | ICD-10-CM

## 2022-02-05 MED ORDER — COMIRNATY 30 MCG/0.3ML IM SUSY
PREFILLED_SYRINGE | INTRAMUSCULAR | 0 refills | Status: DC
  Filled 2022-02-05: qty 0.3, 1d supply, fill #0

## 2022-02-25 ENCOUNTER — Other Ambulatory Visit (HOSPITAL_COMMUNITY): Payer: Self-pay

## 2022-03-17 ENCOUNTER — Other Ambulatory Visit (HOSPITAL_COMMUNITY): Payer: Self-pay

## 2022-03-18 ENCOUNTER — Ambulatory Visit: Payer: PPO | Admitting: Family Medicine

## 2022-03-19 ENCOUNTER — Ambulatory Visit: Payer: PPO | Admitting: Family Medicine

## 2022-03-20 ENCOUNTER — Ambulatory Visit (INDEPENDENT_AMBULATORY_CARE_PROVIDER_SITE_OTHER): Payer: PPO | Admitting: Family Medicine

## 2022-03-20 ENCOUNTER — Encounter: Payer: Self-pay | Admitting: Family Medicine

## 2022-03-20 VITALS — BP 130/80 | HR 60 | Temp 97.5°F | Ht 67.0 in | Wt 186.2 lb

## 2022-03-20 DIAGNOSIS — E118 Type 2 diabetes mellitus with unspecified complications: Secondary | ICD-10-CM

## 2022-03-20 DIAGNOSIS — I25709 Atherosclerosis of coronary artery bypass graft(s), unspecified, with unspecified angina pectoris: Secondary | ICD-10-CM

## 2022-03-20 DIAGNOSIS — E7849 Other hyperlipidemia: Secondary | ICD-10-CM | POA: Diagnosis not present

## 2022-03-20 DIAGNOSIS — I10 Essential (primary) hypertension: Secondary | ICD-10-CM | POA: Diagnosis not present

## 2022-03-20 DIAGNOSIS — I480 Paroxysmal atrial fibrillation: Secondary | ICD-10-CM | POA: Diagnosis not present

## 2022-03-20 NOTE — Progress Notes (Signed)
Provider:  Alain Honey, MD  Careteam: Patient Care Team: Wardell Honour, MD as PCP - General (Family Medicine) Belva Crome, MD as PCP - Cardiology (Cardiology)  PLACE OF SERVICE:  [place] Directive information    Allergies  Allergen Reactions   Septra [Sulfamethoxazole-Trimethoprim] Rash    Chief Complaint  Patient presents with   Medical Management of Chronic Issues    Patient presents today for a 6 month follow-up     HPI: Patient is a 76 y.o. male patient is here for medical management of chronic problems including hyperlipidemia, type 2 diabetes, hypertension, and coronary artery disease.  Basically he is asymptomatic.  He may have some early signs of neuropathy with tingling in his toes. He monitors sugars infrequently at home and I told him this is okay as long as we can keep his A1c under 7, which it has been. Rosuvastatin was increased to 20 mg 6 months ago and he needs lipids reassessed today to see if we are at goal. He saw Dr. Carolynn Sayers in ophthalmology in March of this year and was given clean bill of health regarding vision  Review of Systems:  Review of Systems  Constitutional: Negative.   HENT: Negative.    Respiratory: Negative.    Cardiovascular: Negative.   Neurological: Negative.   Psychiatric/Behavioral: Negative.    All other systems reviewed and are negative.   Past Medical History:  Diagnosis Date   BPH (benign prostatic hyperplasia)    [facility]   Coronary artery disease    a. Myoview 4/17: EF 47%, anteroseptal, apical anterior, apical septal, apical scar with peri-infarct ischemia, inferoseptal, inferior, inferolateral, apical inferior infarct with peri-infarct ischemia, high risk // b. LHC 4/17: Proximal RCA 99%, proximal LCx 100%, mid LAD 90%, distal LAD 60%, EF 55%   Diabetes mellitus    History of Doppler ultrasound    a. Carotid US 4/17:  bilateral ICA 1-39%   History of echocardiogram    a. Echo 4/17:  EF  60-65%, normal wall motion, grade 1 diastolic dysfunction   HLD (hyperlipidemia)    Hypertension    LBBB (left bundle branch block)    Nephrolithiasis    Past Surgical History:  Procedure Laterality Date   APPENDECTOMY  1976   CARDIAC CATHETERIZATION N/A 08/03/2015   Procedure: Left Heart Cath and Coronary Angiography;  Surgeon: Belva Crome, MD;  Location: [place] [facility];  Service: Cardiovascular;  Laterality: N/A;   COLONOSCOPY     CORONARY ARTERY BYPASS GRAFT N/A 08/13/2015   Procedure: CORONARY ARTERY BYPASS GRAFTING (CABG) x 4 using left internal mammary artery and right leg saphenous vein;  Surgeon: Gaye Pollack, MD;  Location: [facility];  Service: Open Heart Surgery;  Laterality: N/A;   TEE WITHOUT CARDIOVERSION N/A 08/13/2015   Procedure: TRANSESOPHAGEAL ECHOCARDIOGRAM (TEE);  Surgeon: Gaye Pollack, MD;  Location: [place];  Service: Open Heart Surgery;  Laterality: N/A;   Social History:   reports that he has never smoked. He has never used smokeless tobacco. He reports that he does not drink alcohol and does not use drugs.  Family History  Problem Relation Age of Onset   Heart failure Mother 87   Heart disease Mother    Heart disease Father    Heart attack Father 60       died from MI at 57   Cancer Sister     Medications: Patient's Medications  New Prescriptions   No medications  on file  Previous Medications   AMLODIPINE (NORVASC) 10 MG TABLET    Take 1 tablet (10 mg total) by mouth daily.   ASPIRIN 81 MG TABLET    Take 81 mg by mouth daily.   CHOLECALCIFEROL (VITAMIN D) 2000 UNITS TABLET    Take 2,000 Units by mouth 3 (three) times a week.    COVID-19 MRNA VACCINE 2023-2024 (COMIRNATY) SYRINGE    Inject into the muscle.   CYANOCOBALAMIN (B-12) 2500 MCG TABS    Take 1 tablet by mouth 3 (three) times a week.   IRBESARTAN-HYDROCHLOROTHIAZIDE (AVALIDE) 300-12.5 MG TABLET    Take 1 tablet by mouth daily.   METFORMIN (GLUCOPHAGE-XR) 500 MG 24 HR TABLET    Take 2 tablets  (1,000 mg total) by mouth daily.   METOPROLOL TARTRATE (LOPRESSOR) 25 MG TABLET    Take 1.5 tablets (37.5 mg total) by mouth 2 (two) times daily. Please keep upcoming appt in April 2023 with Dr. Tamala Julian before anymore refills. Thank you Final Attempt   ROSUVASTATIN (CRESTOR) 20 MG TABLET    Take 1 tablet (20 mg total) by mouth daily.   ZOSTER VACCINE ADJUVANTED [facility]) INJECTION    Inject 0.5 mLs into the muscle.  Modified Medications   No medications on file  Discontinued Medications   IRBESARTAN-HYDROCHLOROTHIAZIDE (AVALIDE) 300-12.5 MG TABLET    Take 1 tablet by mouth 2 (two) times daily.    Physical Exam:  Vitals:   03/20/22 1021  BP: 130/80  Pulse: 60  Temp: (!) 97.5 F (36.4 C)  SpO2: 97%  Weight: 186 lb 3.2 oz (84.5 kg)  Height: '5\' 7"'$  (1.702 m)   Body mass index is 29.16 kg/m. Wt Readings from Last 3 Encounters:  03/20/22 186 lb 3.2 oz (84.5 kg)  09/10/21 186 lb 8 oz (84.6 kg)  08/14/21 186 lb 6.4 oz (84.6 kg)    Physical Exam Vitals and nursing note reviewed.  Constitutional:      Appearance: Normal appearance.  Cardiovascular:     Rate and Rhythm: Normal rate and regular rhythm.     Heart sounds: Normal heart sounds.  Pulmonary:     Effort: Pulmonary effort is normal.     Breath sounds: Normal breath sounds.  Musculoskeletal:        General: Normal range of motion.  Neurological:     General: No focal deficit present.     Mental Status: He is alert and oriented to person, place, and time.     Labs reviewed: Basic Metabolic Panel: No results for input(s): "NA", "K", "CL", "CO2", "GLUCOSE", "BUN", "CREATININE", "CALCIUM", "MG", "PHOS", "TSH" in the last 8760 hours. Liver Function Tests: No results for input(s): "AST", "ALT", "ALKPHOS", "BILITOT", "PROT", "ALBUMIN" in the last 8760 hours. No results for input(s): "LIPASE", "AMYLASE" in the last 8760 hours. No results for input(s): "AMMONIA" in the last 8760 hours. CBC: No results for input(s): "WBC",  "NEUTROABS", "HGB", "HCT", "MCV", "PLT" in the last 8760 hours. Lipid Panel: No results for input(s): "CHOL", "HDL", "LDLCALC", "TRIG", "CHOLHDL", "LDLDIRECT" in the last 8760 hours. TSH: No results for input(s): "TSH" in the last 8760 hours. A1C: Lab Results  Component Value Date   HGBA1C 6.9 (H) 09/10/2021     Assessment/Plan  1. Other hyperlipidemia Blood drawn today to reassess lipids on increased dose of rosuvastatin  2. Essential hypertension Blood pressure is good at 130/80 continue metoprolol and amlodipine   3. Paroxysmal atrial fibrillation (HCC) Patient appears to be in sinus rhythm today.  4.  Coronary artery disease involving coronary bypass graft of native heart with angina pectoris (HCC) No recent chest pain  5. Diabetes mellitus type 2 with complications (HCC) Takes metformin XR 500 mg once a day.  Maintains diabetic diet and weight  Alain Honey, MD [place] [phone]

## 2022-03-21 ENCOUNTER — Encounter: Payer: Self-pay | Admitting: Family Medicine

## 2022-03-21 LAB — LIPID PANEL
Cholesterol: 108 mg/dL (ref <200)
HDL: 38 mg/dL — ABNORMAL LOW (ref >=40)
LDL Cholesterol (Calc): 47 mg/dL (calc)
Non-HDL Cholesterol (Calc): 70 mg/dL (calc) (ref <130)
Total CHOL/HDL Ratio: 2.8 (calc) (ref <5.0)
Triglycerides: 145 mg/dL (ref <150)

## 2022-03-21 LAB — COMPLETE METABOLIC PANEL WITH GFR
AG Ratio: 1.7 (calc) (ref 1.0–2.5)
ALT: 25 U/L (ref 9–46)
AST: 21 U/L (ref 10–35)
Albumin: 4.7 g/dL (ref 3.6–5.1)
Alkaline phosphatase (APISO): 82 U/L (ref 35–144)
BUN/Creatinine Ratio: 17 (calc) (ref 6–22)
BUN: 25 mg/dL (ref 7–25)
CO2: 29 mmol/L (ref 20–32)
Calcium: 10.2 mg/dL (ref 8.6–10.3)
Chloride: 98 mmol/L (ref 98–110)
Creat: 1.51 mg/dL — ABNORMAL HIGH (ref 0.70–1.28)
Globulin: 2.7 g/dL (calc) (ref 1.9–3.7)
Glucose, Bld: 142 mg/dL — ABNORMAL HIGH (ref 65–99)
Potassium: 4.6 mmol/L (ref 3.5–5.3)
Sodium: 137 mmol/L (ref 135–146)
Total Bilirubin: 0.7 mg/dL (ref 0.2–1.2)
Total Protein: 7.4 g/dL (ref 6.1–8.1)
eGFR: 48 mL/min/{1.73_m2} — ABNORMAL LOW (ref >=60)

## 2022-03-21 NOTE — Addendum Note (Signed)
Addended by: Wardell Honour on: 03/21/2022 08:47 AM   Modules accepted: Orders

## 2022-03-24 ENCOUNTER — Other Ambulatory Visit: Payer: Self-pay

## 2022-03-24 DIAGNOSIS — E118 Type 2 diabetes mellitus with unspecified complications: Secondary | ICD-10-CM

## 2022-03-25 ENCOUNTER — Other Ambulatory Visit: Payer: PPO

## 2022-03-25 ENCOUNTER — Other Ambulatory Visit: Payer: Self-pay

## 2022-03-25 DIAGNOSIS — E118 Type 2 diabetes mellitus with unspecified complications: Secondary | ICD-10-CM

## 2022-03-26 LAB — HEMOGLOBIN A1C
Hgb A1c MFr Bld: 7.3 % of total Hgb — ABNORMAL HIGH (ref <5.7)
Mean Plasma Glucose: 163 mg/dL
eAG (mmol/L): 9 mmol/L

## 2022-03-31 ENCOUNTER — Other Ambulatory Visit: Payer: Self-pay | Admitting: Family Medicine

## 2022-03-31 ENCOUNTER — Other Ambulatory Visit (HOSPITAL_COMMUNITY): Payer: Self-pay

## 2022-03-31 MED ORDER — METFORMIN HCL ER 500 MG PO TB24
1000.0000 mg | ORAL_TABLET | Freq: Every day | ORAL | 1 refills | Status: DC
  Filled 2022-03-31: qty 180, 90d supply, fill #0
  Filled 2022-06-30: qty 180, 90d supply, fill #1

## 2022-03-31 MED ORDER — IRBESARTAN-HYDROCHLOROTHIAZIDE 300-12.5 MG PO TABS
1.0000 | ORAL_TABLET | Freq: Every day | ORAL | 1 refills | Status: DC
  Filled 2022-03-31 – 2022-04-20 (×2): qty 90, 90d supply, fill #0
  Filled 2022-07-22: qty 90, 90d supply, fill #1

## 2022-04-21 ENCOUNTER — Other Ambulatory Visit: Payer: Self-pay

## 2022-04-22 ENCOUNTER — Other Ambulatory Visit (HOSPITAL_COMMUNITY): Payer: Self-pay

## 2022-06-15 ENCOUNTER — Other Ambulatory Visit: Payer: Self-pay

## 2022-06-19 ENCOUNTER — Encounter: Payer: Self-pay | Admitting: Adult Health

## 2022-06-19 ENCOUNTER — Other Ambulatory Visit: Payer: Self-pay | Admitting: Family Medicine

## 2022-06-19 ENCOUNTER — Ambulatory Visit (INDEPENDENT_AMBULATORY_CARE_PROVIDER_SITE_OTHER): Payer: PPO | Admitting: Adult Health

## 2022-06-19 ENCOUNTER — Other Ambulatory Visit (HOSPITAL_COMMUNITY): Payer: Self-pay

## 2022-06-19 VITALS — BP 127/88 | HR 60 | Temp 97.7°F | Resp 18 | Ht 67.0 in | Wt 187.2 lb

## 2022-06-19 DIAGNOSIS — Z0000 Encounter for general adult medical examination without abnormal findings: Secondary | ICD-10-CM | POA: Diagnosis not present

## 2022-06-19 MED ORDER — AMLODIPINE BESYLATE 10 MG PO TABS
10.0000 mg | ORAL_TABLET | Freq: Every day | ORAL | 2 refills | Status: DC
  Filled 2022-06-19: qty 90, 90d supply, fill #0
  Filled 2022-09-07: qty 90, 90d supply, fill #1

## 2022-06-19 NOTE — Progress Notes (Signed)
Subjective:   [name] [name] is a 77 y.o. male who presents for Medicare Annual/Subsequent preventive examination.  Review of Systems          Objective:    Today's Vitals   06/19/22 1049  BP: 127/88  Pulse: 60  Resp: 18  Temp: 97.7 F (36.5 C)  SpO2: 96%  Weight: 187 lb 4 oz (84.9 kg)  Height: '5\' 7"'$  (1.702 m)   Body mass index is 29.33 kg/m.     08/13/2015    6:12 AM 08/10/2015    8:09 AM 08/03/2015    7:45 AM  Advanced Directives  Does Patient Have a Medical Advance Directive? Yes Yes Yes  Type of Advance Directive Living will;Healthcare Power of [place];Living will [place];Living will  Does patient want to make changes to medical advance directive?  No - Patient declined No - Patient declined  Copy of [place] in Chart?   No - copy requested    Current Medications (verified) Outpatient Encounter Medications as of 06/19/2022  Medication Sig   amLODipine (NORVASC) 10 MG tablet Take 1 tablet (10 mg total) by mouth daily.   aspirin 81 MG tablet Take 81 mg by mouth daily.   Cholecalciferol (VITAMIN D) 2000 units tablet Take 2,000 Units by mouth 3 (three) times a week.    COVID-19 mRNA vaccine 2023-2024 (COMIRNATY) syringe Inject into the muscle.   Cyanocobalamin (B-12) 2500 MCG TABS Take 1 tablet by mouth 3 (three) times a week.   irbesartan-hydrochlorothiazide (AVALIDE) 300-12.5 MG tablet Take 1 tablet by mouth daily.   metFORMIN (GLUCOPHAGE-XR) 500 MG 24 hr tablet Take 2 tablets (1,000 mg total) by mouth daily.   metoprolol tartrate (LOPRESSOR) 25 MG tablet Take 1.5 tablets (37.5 mg total) by mouth 2 (two) times daily. Please keep upcoming appt in April 2023 with Dr. Tamala Julian before anymore refills. Thank you Final Attempt   rosuvastatin (CRESTOR) 20 MG tablet Take 1 tablet (20 mg total) by mouth daily.   Zoster Vaccine Adjuvanted [facility]) injection Inject 0.5 mLs into the muscle.   No  facility-administered encounter medications on file as of 06/19/2022.    Allergies (verified) Septra [sulfamethoxazole-trimethoprim]   History: Past Medical History:  Diagnosis Date   BPH (benign prostatic hyperplasia)    [facility]   Coronary artery disease    a. Myoview 4/17: EF 47%, anteroseptal, apical anterior, apical septal, apical scar with peri-infarct ischemia, inferoseptal, inferior, inferolateral, apical inferior infarct with peri-infarct ischemia, high risk // b. LHC 4/17: Proximal RCA 99%, proximal LCx 100%, mid LAD 90%, distal LAD 60%, EF 55%   Diabetes mellitus    History of Doppler ultrasound    a. Carotid US 4/17:  bilateral ICA 1-39%   History of echocardiogram    a. Echo 4/17:  EF 60-65%, normal wall motion, grade 1 diastolic dysfunction   HLD (hyperlipidemia)    Hypertension    LBBB (left bundle branch block)    Nephrolithiasis    Past Surgical History:  Procedure Laterality Date   APPENDECTOMY  1976   CARDIAC CATHETERIZATION N/A 08/03/2015   Procedure: Left Heart Cath and Coronary Angiography;  Surgeon: Belva Crome, MD;  Location: [facility];  Service: Cardiovascular;  Laterality: N/A;   COLONOSCOPY     CORONARY ARTERY BYPASS GRAFT N/A 08/13/2015   Procedure: CORONARY ARTERY BYPASS GRAFTING (CABG) x 4 using left internal mammary artery and right leg saphenous vein;  Surgeon: Fernande Boyden  Cyndia Bent, MD;  Location: [place] [facility];  Service: Open Heart Surgery;  Laterality: N/A;   TEE WITHOUT CARDIOVERSION N/A 08/13/2015   Procedure: TRANSESOPHAGEAL ECHOCARDIOGRAM (TEE);  Surgeon: Gaye Pollack, MD;  Location: [facility];  Service: Open Heart Surgery;  Laterality: N/A;   Family History  Problem Relation Age of Onset   Heart failure Mother 21   Heart disease Mother    Heart disease Father    Heart attack Father 92       died from MI at 93   Cancer Sister    Social History   Socioeconomic History   Marital status: Married    Spouse name: Not on file   Number of  children: Not on file   Years of education: Not on file   Highest education level: Not on file  Occupational History   Not on file  Tobacco Use   Smoking status: Never   Smokeless tobacco: Never  Vaping Use   Vaping Use: Never used  Substance and Sexual Activity   Alcohol use: No   Drug use: Never   Sexual activity: Not on file  Other Topics Concern   Not on file  Social History Narrative   Retired   Married (1987); [employer] delivery x 30 years   Originally from [place]: "Nothing special"   Patient does consume drinks/foods with caffeine: coffee, tea, soda.   Patient lives in a house with one other.    Patient does some exercise - yard and house work.    Patient does have living will, DNR, and HCPOA   Patient has no difficulty bathing, dressing, preparing/eating foods, managing medications, managing finances or affording medications.    Social Determinants of Health   Financial Resource Strain: Not on file  Food Insecurity: Not on file  Transportation Needs: Not on file  Physical Activity: Not on file  Stress: Not on file  Social Connections: Not on file    Tobacco Counseling Counseling given: Not Answered   Clinical Intake:                 Diabetic? Yes         Activities of Daily Living     No data to display          Patient Care Team: Wardell Honour, MD as PCP - General (Family Medicine) Belva Crome, MD (Inactive) as PCP - Cardiology (Cardiology)  Indicate any recent Medical Services you may have received from other than [facility] providers in the past year (date may be approximate).     Assessment:   This is a routine wellness examination for [name] [name].  Hearing/Vision screen Hearing Screening - Comments:: No problem with patient hearing and has done exam last year. Vision Screening - Comments:: No problem with vision and wear glasses just for reading. Did have a eye exam last year.  Dietary issues  and exercise activities discussed:     Goals Addressed   None   Depression Screen    06/19/2022   10:52 AM 06/19/2022   10:38 AM 03/20/2022   10:30 AM 03/05/2021    2:01 PM 02/08/2016    2:45 PM 10/24/2015    3:36 PM 08/30/2015    6:03 PM  PHQ 2/9 Scores  PHQ - 2 Score 0 0 0 0 0 0 0  PHQ- 9 Score 0          Fall Risk    06/19/2022  10:38 AM 03/20/2022   10:29 AM 03/05/2021    2:01 PM 10/18/2015    9:06 AM 08/30/2015    6:03 PM  Fall Risk   Falls in the past year? 0 0 0 No No  Number falls in past yr: 0 0 0    Injury with Fall? 0 0 0    Risk for fall due to : No Fall Risks No Fall Risks No Fall Risks    Follow up Falls evaluation completed Falls evaluation completed Falls evaluation completed;Education provided;Falls prevention discussed      FALL RISK PREVENTION PERTAINING TO THE HOME:  Any stairs in or around the home? Yes  If so, are there any without handrails? No Home free of loose throw rugs in walkways, pet beds, electrical cords, etc? No  Adequate lighting in your home to reduce risk of falls? Yes   ASSISTIVE DEVICES UTILIZED TO PREVENT FALLS:  Life alert? No  Use of a cane, walker or w/c? No  Grab bars in the bathroom? No  Shower chair or bench in shower? No  Elevated toilet seat or a handicapped toilet? No   TIMED UP AND GO:  Was the test performed? No .  Length of time to ambulate 10 feet: N/A sec.   Gait steady and fast without use of assistive device  Cognitive Function:    06/19/2022   10:42 AM  MMSE - Mini Mental State Exam  Orientation to time 5  Orientation to Place 5  Registration 3  Attention/ Calculation 5  Recall 3  Language- name 2 objects 2  Language- repeat 1  Language- follow 3 step command 3  Language- read & follow direction 1  Write a sentence 1  Copy design 0  Total score 29        Immunizations Immunization History  Administered Date(s) Administered   COVID-19, mRNA, vaccine(Comirnaty)12 years and older 02/05/2022    Fluad Quad(high Dose 65+) 01/09/2021, 02/05/2022   Influenza,inj,Quad PF,6+ Mos 01/25/2016, 02/05/2017, 01/22/2018, 01/21/2019, 01/24/2020   Influenza,inj,quad, With Preservative 02/05/2015   Moderna SARS-COV2 Booster Vaccination 02/24/2020, 09/25/2020   Moderna Sars-Covid-2 Vaccination 06/03/2019, 07/01/2019   PFIZER(Purple Top)SARS-COV-2 Vaccination 02/04/2021   Pneumococcal Conjugate-13 02/22/2014   Pneumococcal Polysaccharide-23 01/19/2013   Tdap 12/01/2019   Zoster Recombinat (Shingrix) 12/18/2020, 05/27/2021   Zoster, Live 02/20/2012    TDAP status: Up to date  Flu Vaccine status: Up to date  Pneumococcal vaccine status: Due, Education has been provided regarding the importance of this vaccine. Advised may receive this vaccine at local pharmacy or Health Dept. Aware to provide a copy of the vaccination record if obtained from local pharmacy or Health Dept. Verbalized acceptance and understanding.  Covid-19 vaccine status: Completed vaccines  Qualifies for Shingles Vaccine? Yes   Zostavax completed No   Shingrix Completed?: Yes  Screening Tests Health Maintenance  Topic Date Due   Diabetic kidney evaluation - Urine ACR  Never done   Hepatitis C Screening  Never done   COLONOSCOPY (Pts 45-47yr Insurance coverage will need to be confirmed)  Never done   COVID-19 Vaccine (5 - 2023-24 season) 04/02/2022   OPHTHALMOLOGY EXAM  07/17/2022   HEMOGLOBIN A1C  09/24/2022   Diabetic kidney evaluation - eGFR measurement  03/21/2023   FOOT EXAM  03/21/2023   Medicare Annual Wellness (AWV)  06/19/2023   DTaP/Tdap/Td (2 - Td or Tdap) 11/30/2029   Pneumonia Vaccine 77 Years old  Completed   INFLUENZA VACCINE  Completed   Zoster Vaccines- Shingrix  Completed   HPV VACCINES  Aged Out    Health Maintenance  Health Maintenance Due  Topic Date Due   Diabetic kidney evaluation - Urine ACR  Never done   Hepatitis C Screening  Never done   COLONOSCOPY (Pts 45-34yr Insurance  coverage will need to be confirmed)  Never done   COVID-19 Vaccine (5 - 2023-24 season) 04/02/2022    Colorectal cancer screening: Type of screening: Colonoscopy. Completed within 5 years. Repeat every 5 years  Lung Cancer Screening: (Low Dose CT Chest recommended if Age 76-80years, 30 pack-year currently smoking OR have quit w/in 15years.) does not qualify.   Lung Cancer Screening Referral: N/A  Additional Screening:  Hepatitis C Screening: does not qualify; Completed Refused  Vision Screening: Recommended annual ophthalmology exams for early detection of glaucoma and other disorders of the eye. Is the patient up to date with their annual eye exam?  Yes  Who is the provider or what is the name of the office in which the patient attends annual eye exams? Dr GKaty FitchIf pt is not established with a provider, would they like to be referred to a provider to establish care?  N/A .   Dental Screening: Recommended annual dental exams for proper oral hygiene  Community Resource Referral / Chronic Care Management: CRR required this visit?  No   CCM required this visit?  No      Plan:     I have personally reviewed and noted the following in the patient's chart:   Medical and social history Use of alcohol, tobacco or illicit drugs  Current medications and supplements including opioid prescriptions. Patient is not currently taking opioid prescriptions. Functional ability and status Nutritional status Physical activity Advanced directives List of other physicians Hospitalizations, surgeries, and ER visits in previous 12 months Vitals Screenings to include cognitive, depression, and falls Referrals and appointments  In addition, I have reviewed and discussed with patient certain preventive protocols, quality metrics, and best practice recommendations. A written personalized care plan for preventive services as well as general preventive health recommendations were provided to  patient.     Ariauna Farabee Medina-Vargas, NP   06/19/2022   Nurse Notes:  recommend to do AWV yearly

## 2022-07-02 ENCOUNTER — Other Ambulatory Visit (HOSPITAL_COMMUNITY): Payer: Self-pay

## 2022-07-22 ENCOUNTER — Other Ambulatory Visit (HOSPITAL_COMMUNITY): Payer: Self-pay

## 2022-07-22 DIAGNOSIS — H3554 Dystrophies primarily involving the retinal pigment epithelium: Secondary | ICD-10-CM | POA: Diagnosis not present

## 2022-07-22 DIAGNOSIS — E119 Type 2 diabetes mellitus without complications: Secondary | ICD-10-CM | POA: Diagnosis not present

## 2022-07-22 DIAGNOSIS — H43813 Vitreous degeneration, bilateral: Secondary | ICD-10-CM | POA: Diagnosis not present

## 2022-07-22 DIAGNOSIS — H25813 Combined forms of age-related cataract, bilateral: Secondary | ICD-10-CM | POA: Diagnosis not present

## 2022-07-22 DIAGNOSIS — H02831 Dermatochalasis of right upper eyelid: Secondary | ICD-10-CM | POA: Diagnosis not present

## 2022-07-22 DIAGNOSIS — H02834 Dermatochalasis of left upper eyelid: Secondary | ICD-10-CM | POA: Diagnosis not present

## 2022-07-22 DIAGNOSIS — H35371 Puckering of macula, right eye: Secondary | ICD-10-CM | POA: Diagnosis not present

## 2022-07-22 LAB — HM DIABETES EYE EXAM

## 2022-08-18 ENCOUNTER — Ambulatory Visit: Payer: PPO | Admitting: Cardiology

## 2022-09-03 NOTE — Progress Notes (Signed)
Cardiology Office Note:    Date:  09/10/2022   ID:  [name] [name] 26-Jul-1945, MRN [id number]  PCP:  [name] Kuster, MD  [facility] Providers Cardiologist:  [name] Sprague, MD     Referring MD: [name] Kuster, MD   Chief Complaint:  Follow-up     History of Present Illness:   [name] [name] is a 77 y.o. male with  a hx of  CAD s/p CABG with LIMA-LAD, SVG-PDA/PL 1, SVG-OM.2017, LBBB, HTN, HL, DM2, postoperative paroxysmal atrial fibrillation and previous amiodarone therapy. Coumadin therapy has been discontinued.  Patient last saw Dr. Katrinka [name] 07/2021 and better lipid control recommended.  Patient comes in for f/u with his wife. Denies chest pain, dyspnea, palpitations, dyspnea. Plays petanque. He does a lot of yard work.             Past Medical History:  Diagnosis Date   BPH (benign prostatic hyperplasia)    [facility]   Coronary artery disease    a. Myoview 4/17: EF 47%, anteroseptal, apical anterior, apical septal, apical scar with peri-infarct ischemia, inferoseptal, inferior, inferolateral, apical inferior infarct with peri-infarct ischemia, high risk // b. LHC 4/17: Proximal RCA 99%, proximal LCx 100%, mid LAD 90%, distal LAD 60%, EF 55%   Diabetes mellitus    History of Doppler ultrasound    a. Carotid US 4/17:  bilateral ICA 1-39%   History of echocardiogram    a. Echo 4/17:  EF 60-65%, normal wall motion, grade 1 diastolic dysfunction   HLD (hyperlipidemia)    Hypertension    LBBB (left bundle branch block)    Nephrolithiasis    Current Medications: Current Meds  Medication Sig   amLODipine (NORVASC) 10 MG tablet Take 1 tablet (10 mg total) by mouth daily.   aspirin 81 MG tablet Take 81 mg by mouth daily.   Cholecalciferol (VITAMIN D) 2000 units tablet Take 2,000 Units by mouth 3 (three) times a week.    Cyanocobalamin (B-12) 2500 MCG TABS Take 1 tablet by mouth 3 (three) times a week.   irbesartan-hydrochlorothiazide (AVALIDE)  300-12.5 MG tablet Take 1 tablet by mouth daily.   metFORMIN (GLUCOPHAGE-XR) 500 MG 24 hr tablet Take 2 tablets (1,000 mg total) by mouth daily.   metoprolol tartrate (LOPRESSOR) 25 MG tablet Take 1.5 tablets (37.5 mg total) by mouth 2 (two) times daily. Please keep upcoming appt in April 2023 with Dr. Katrinka [name] before anymore refills. Thank you Final Attempt   rosuvastatin (CRESTOR) 20 MG tablet Take 1 tablet (20 mg total) by mouth daily.    Allergies:   Septra [sulfamethoxazole-trimethoprim]   Social History   Tobacco Use   Smoking status: Never   Smokeless tobacco: Never  Vaping Use   Vaping Use: Never used  Substance Use Topics   Alcohol use: No   Drug use: Never    Family Hx: The patient's family history includes Cancer in his sister; Heart attack (age of onset: 20) in his father; Heart disease in his father and mother; Heart failure (age of onset: 50) in his mother.  ROS     Physical Exam:    VS:  BP 120/66   Pulse 66   Ht 5\' 7"  (1.702 m)   Wt 182 lb 6.4 oz (82.7 kg)   SpO2 91%   BMI 28.57 kg/m     Wt Readings from Last 3 Encounters:  09/10/22 182 lb 6.4 oz (82.7 kg)  06/19/22 187 lb 4 oz (84.9 kg)  03/20/22 186 lb 3.2 oz (84.5 kg)    Physical Exam  GEN: Well nourished, well developed, in no acute distress  Neck: no JVD, carotid bruits, or masses Cardiac:RRR; no murmurs, rubs, or gallops  Respiratory:  clear to auscultation bilaterally, normal work of breathing GI: soft, nontender, nondistended, + BS Ext: without cyanosis, clubbing, or edema, Good distal pulses bilaterally Neuro:  Alert and Oriented x 3,  Psych: euthymic mood, full affect        EKGs/Labs/Other Test Reviewed:    EKG:  EKG is  ordered today.  The ekg ordered today demonstrates NSR with LBBB  Recent Labs: 03/20/2022: ALT 25; BUN 25; Creat 1.51; Potassium 4.6; Sodium 137   Recent Lipid Panel Recent Labs    03/20/22 1111  CHOL 108  TRIG 145  HDL 38*  LDLCALC 47     Prior CV  Studies: LEFT HEART CATH AND CORONARY ANGIOGRAPHY 08/03/2015  Narrative 1. Prox RCA lesion, 99% stenosed. 2. Prox Cx to Mid Cx lesion, 100% stenosed. 3. Mid LAD lesion, 90% stenosed. 4. Dist LAD lesion, 60% stenosed.   Severe three-vessel coronary disease in 77 year old diabetic patient with 90% proximal LAD arising after the second diagonal but before the first several perforated, total occlusion of the proximal circumflex, and high grade obstruction in the proximal RCA (dominant vessel)  Overall normal LV function with EF 55%   RECOMMENDATIONS:   CABG per TCTS   ECHO COMPLETE WO IMAGING ENHANCING AGENT 08/01/2015  Narrative *[facility]* [address] [place], [place] [address] [phone]  ------------------------------------------------------------------- Transthoracic Echocardiography  (Report amended )  Patient:    [name] [name] MR #:       [id number] Study Date: 08/01/2015 Gender:     M Age:        54 Height:     172.7 cm Weight:     85 kg BSA:        2.04 m^2 Pt. Status: Room:  ATTENDING    [name] [name], M.D. SONOGRAPHER  Luvenia Redden, RDCS ORDERING     Alben Spittle, [name] [name] REFERRING    Alben Spittle, [name] [name] PERFORMING   [facility], Outpatient  cc:  ------------------------------------------------------------------- LV EF: 60% -   65%  ------------------------------------------------------------------- Indications:      LBBB (I44.7).  SOB (R06.02).  ------------------------------------------------------------------- History:   PMH:  Acquired from the patient and from the patient&'s chart.  Dyspnea.  Risk factors:  Family history of coronary artery disease. Hypertension. Diabetes mellitus. Dyslipidemia.  ------------------------------------------------------------------- Study Conclusions  - Left ventricle: The cavity size was normal. Wall thickness was normal. Systolic function was normal. The estimated ejection fraction was in the range of 60% to  65%. Wall motion was normal; there were no regional wall motion abnormalities. Doppler parameters are consistent with abnormal left ventricular relaxation (grade 1 diastolic dysfunction).  Transthoracic echocardiography.  M-mode, complete 2D, spectral Doppler, and color Doppler.  Birthdate:  Patient birthdate: 08-Feb-1946.  Age:  Patient is 77 yr old.  Sex:  Gender: male. BMI: 28.5 kg/m^2.  Blood pressure:     142/60  Patient status: Outpatient.  Study date:  Study date: 08/01/2015. Study time: 08:27 AM.  Location:  [facility]  -------------------------------------------------------------------  ------------------------------------------------------------------- Left ventricle:  The cavity size was normal. Wall thickness was normal. Systolic function was normal. The estimated ejection fraction was in the range of 60% to 65%. Wall motion was normal; there were no regional wall motion abnormalities. Doppler parameters are consistent with abnormal left ventricular relaxation (  grade 1 diastolic dysfunction).  ------------------------------------------------------------------- Aortic valve:   Mildly thickened, mildly calcified leaflets. Doppler:   There was no stenosis.  ------------------------------------------------------------------- Aorta:  Aortic root: The aortic root was normal in size. Ascending aorta: The ascending aorta was normal in size.  ------------------------------------------------------------------- Mitral valve:   Structurally normal valve.   Leaflet separation was normal.  Doppler:  Transvalvular velocity was within the normal range. There was no evidence for stenosis. There was no regurgitation.  ------------------------------------------------------------------- Left atrium:  The atrium was normal in size.  ------------------------------------------------------------------- Right ventricle:  The cavity size was normal. Systolic function  was normal.  ------------------------------------------------------------------- Pulmonic valve:    The valve appears to be grossly normal. Doppler:  There was no significant regurgitation.  ------------------------------------------------------------------- Tricuspid valve:   The valve appears to be grossly normal. Doppler:  There was trivial regurgitation.  ------------------------------------------------------------------- Right atrium:  The atrium was normal in size.  ------------------------------------------------------------------- Pericardium:  There was no pericardial effusion.  ------------------------------------------------------------------- Measurements  Left ventricle                         Value        Reference LV ID, ED, PLAX chordal        (L)     37    mm     43 - 52 LV ID, ES, PLAX chordal                26    mm     23 - 38 LV fx shortening, PLAX chordal         30    %      >=29 LV PW thickness, ED                    10    mm     --------- IVS/LV PW ratio, ED                    0.7          <=1.3 LV e&', lateral                         6.64  cm/s   --------- LV E/e&', lateral                       9.86         --------- LV e&', medial                          2.61  cm/s   --------- LV E/e&', medial                        25.1         --------- LV e&', average                         4.63  cm/s   --------- LV E/e&', average                       14.16        ---------  Ventricular septum                     Value        Reference IVS thickness, ED  7     mm     ---------  Aorta                                  Value        Reference Aortic root ID                         38    mm     ---------  Left atrium                            Value        Reference LA ID, A-P, ES                         30    mm     --------- LA ID/bsa, A-P                         1.47  cm/m^2 <=2.2  Mitral valve                           Value         Reference Mitral E-wave peak velocity            65.5  cm/s   --------- Mitral A-wave peak velocity            96.5  cm/s   --------- Mitral E/A ratio, peak                 0.68         ---------  Systemic veins                         Value        Reference Estimated CVP                          3     mm Hg  ---------  Right ventricle                        Value        Reference RV s&', lateral, S                      11    cm/s   ---------  Legend: (L)  and  (H)  mark values outside specified reference range.  ------------------------------------------------------------------- Deno Lunger, M.D. 2017-04-12T09:45:53        Risk Assessment/Calculations/Metrics:              ASSESSMENT & PLAN:   No problem-specific Assessment & Plan notes found for this encounter.   Coronary artery disease involving coronary bypass graft of native heart with angina pectoris (HCC)  -CABG 2017 -no angina  Diabetes mellitus type 2 with complications (HCC)  A1C 7.3 03/2022 -decrease sweet tea intake.  Other hyperlipidemia  LDL  47 02/2022  Essential hypertension  -well controlled.            Dispo:  No follow-ups on file.   Medication Adjustments/Labs and Tests Ordered: Current medicines are reviewed at length with the patient today.  Concerns regarding medicines are  outlined above.  Tests Ordered: Orders Placed This Encounter  Procedures   EKG 12-Lead   Medication Changes: No orders of the defined types were placed in this encounter.  Signed, Jacolyn Reedy, PA-C  09/10/2022 1:13 PM    [facility] [address] [place], [place], [place]  [address] Phone: [phone]; Fax: [phone]

## 2022-09-07 ENCOUNTER — Other Ambulatory Visit: Payer: Self-pay

## 2022-09-10 ENCOUNTER — Other Ambulatory Visit (HOSPITAL_COMMUNITY): Payer: Self-pay

## 2022-09-10 ENCOUNTER — Ambulatory Visit: Payer: PPO | Attending: Cardiology | Admitting: Physician Assistant

## 2022-09-10 ENCOUNTER — Other Ambulatory Visit: Payer: Self-pay | Admitting: Interventional Cardiology

## 2022-09-10 ENCOUNTER — Encounter: Payer: Self-pay | Admitting: Physician Assistant

## 2022-09-10 VITALS — BP 120/66 | HR 66 | Ht 67.0 in | Wt 182.4 lb

## 2022-09-10 DIAGNOSIS — E7849 Other hyperlipidemia: Secondary | ICD-10-CM | POA: Diagnosis not present

## 2022-09-10 DIAGNOSIS — E118 Type 2 diabetes mellitus with unspecified complications: Secondary | ICD-10-CM

## 2022-09-10 DIAGNOSIS — I10 Essential (primary) hypertension: Secondary | ICD-10-CM | POA: Diagnosis not present

## 2022-09-10 DIAGNOSIS — Z7984 Long term (current) use of oral hypoglycemic drugs: Secondary | ICD-10-CM

## 2022-09-10 DIAGNOSIS — I25709 Atherosclerosis of coronary artery bypass graft(s), unspecified, with unspecified angina pectoris: Secondary | ICD-10-CM

## 2022-09-10 DIAGNOSIS — I480 Paroxysmal atrial fibrillation: Secondary | ICD-10-CM

## 2022-09-10 MED ORDER — METOPROLOL TARTRATE 25 MG PO TABS
37.5000 mg | ORAL_TABLET | Freq: Two times a day (BID) | ORAL | 3 refills | Status: DC
  Filled 2022-09-10 – 2022-09-16 (×3): qty 270, 90d supply, fill #0
  Filled 2022-12-16: qty 270, 90d supply, fill #1
  Filled 2023-03-23: qty 270, 90d supply, fill #2
  Filled 2023-08-11: qty 270, 90d supply, fill #0

## 2022-09-10 NOTE — Patient Instructions (Signed)
Medication Instructions:  Your physician recommends that you continue on your current medications as directed. Please refer to the Current Medication list given to you today. *If you need a refill on your cardiac medications before your next appointment, please call your pharmacy*   Lab Work: NONE ORDERED If you have labs (blood work) drawn today and your tests are completely normal, you will receive your results only by: MyChart Message (if you have MyChart) OR A paper copy in the mail If you have any lab test that is abnormal or we need to change your treatment, we will call you to review the results.   Testing/Procedures: NONE ORDERED   Follow-Up: At [facility], you and your health needs are our priority.  As part of our continuing mission to provide you with exceptional heart care, we have created designated Provider Care Teams.  These Care Teams include your primary Cardiologist (physician) and Advanced Practice Providers (APPs -  Physician Assistants and Nurse Practitioners) who all work together to provide you with the care you need, when you need it.  We recommend signing up for the patient portal called "MyChart".  Sign up information is provided on this After Visit Summary.  MyChart is used to connect with patients for Virtual Visits (Telemedicine).  Patients are able to view lab/test results, encounter notes, upcoming appointments, etc.  Non-urgent messages can be sent to your provider as well.   To learn more about what you can do with MyChart, go to [email or web address].    Your next appointment:   12 month(s)  Provider:   Meriam Sprague, MD     Other Instructions EXERCISE FOR 150 MINUTES PER DAY DECREASE YOUR SUGAR INTAKE

## 2022-09-16 ENCOUNTER — Other Ambulatory Visit (HOSPITAL_COMMUNITY): Payer: Self-pay

## 2022-09-18 ENCOUNTER — Other Ambulatory Visit (HOSPITAL_COMMUNITY): Payer: Self-pay

## 2022-09-23 ENCOUNTER — Other Ambulatory Visit (HOSPITAL_COMMUNITY): Payer: Self-pay

## 2022-09-23 DIAGNOSIS — L814 Other melanin hyperpigmentation: Secondary | ICD-10-CM | POA: Diagnosis not present

## 2022-09-23 DIAGNOSIS — L821 Other seborrheic keratosis: Secondary | ICD-10-CM | POA: Diagnosis not present

## 2022-09-23 DIAGNOSIS — L578 Other skin changes due to chronic exposure to nonionizing radiation: Secondary | ICD-10-CM | POA: Diagnosis not present

## 2022-09-23 DIAGNOSIS — L570 Actinic keratosis: Secondary | ICD-10-CM | POA: Diagnosis not present

## 2022-09-23 DIAGNOSIS — D225 Melanocytic nevi of trunk: Secondary | ICD-10-CM | POA: Diagnosis not present

## 2022-09-23 DIAGNOSIS — B359 Dermatophytosis, unspecified: Secondary | ICD-10-CM | POA: Diagnosis not present

## 2022-09-23 DIAGNOSIS — D485 Neoplasm of uncertain behavior of skin: Secondary | ICD-10-CM | POA: Diagnosis not present

## 2022-09-23 MED ORDER — KETOCONAZOLE 2 % EX CREA
1.0000 | TOPICAL_CREAM | Freq: Every day | CUTANEOUS | 1 refills | Status: DC
  Filled 2022-09-23: qty 45, 30d supply, fill #0

## 2022-09-26 ENCOUNTER — Other Ambulatory Visit (HOSPITAL_COMMUNITY): Payer: Self-pay

## 2022-09-26 ENCOUNTER — Other Ambulatory Visit: Payer: Self-pay | Admitting: Family Medicine

## 2022-09-26 MED ORDER — METFORMIN HCL ER 500 MG PO TB24
1000.0000 mg | ORAL_TABLET | Freq: Every day | ORAL | 1 refills | Status: DC
  Filled 2022-09-26: qty 180, 90d supply, fill #0

## 2022-10-12 ENCOUNTER — Other Ambulatory Visit: Payer: Self-pay | Admitting: Family Medicine

## 2022-10-13 ENCOUNTER — Other Ambulatory Visit (HOSPITAL_COMMUNITY): Payer: Self-pay

## 2022-10-13 MED ORDER — IRBESARTAN-HYDROCHLOROTHIAZIDE 300-12.5 MG PO TABS
1.0000 | ORAL_TABLET | Freq: Every day | ORAL | 1 refills | Status: DC
  Filled 2022-10-13: qty 90, 90d supply, fill #0

## 2022-10-14 ENCOUNTER — Other Ambulatory Visit (HOSPITAL_COMMUNITY): Payer: Self-pay

## 2022-10-30 ENCOUNTER — Encounter: Payer: Self-pay | Admitting: Family Medicine

## 2022-10-30 ENCOUNTER — Other Ambulatory Visit (HOSPITAL_COMMUNITY): Payer: Self-pay

## 2022-10-30 ENCOUNTER — Ambulatory Visit (INDEPENDENT_AMBULATORY_CARE_PROVIDER_SITE_OTHER): Payer: PPO | Admitting: Family Medicine

## 2022-10-30 VITALS — BP 100/52 | HR 60 | Temp 96.4°F | Ht 67.0 in | Wt 183.8 lb

## 2022-10-30 DIAGNOSIS — E118 Type 2 diabetes mellitus with unspecified complications: Secondary | ICD-10-CM | POA: Diagnosis not present

## 2022-10-30 DIAGNOSIS — Z7984 Long term (current) use of oral hypoglycemic drugs: Secondary | ICD-10-CM | POA: Diagnosis not present

## 2022-10-30 DIAGNOSIS — E7849 Other hyperlipidemia: Secondary | ICD-10-CM | POA: Diagnosis not present

## 2022-10-30 DIAGNOSIS — I10 Essential (primary) hypertension: Secondary | ICD-10-CM

## 2022-10-30 DIAGNOSIS — E1121 Type 2 diabetes mellitus with diabetic nephropathy: Secondary | ICD-10-CM

## 2022-10-30 LAB — BASIC METABOLIC PANEL
BUN: 33 mg/dL — ABNORMAL HIGH (ref 6–23)
CO2: 28 mEq/L (ref 19–32)
Calcium: 10.6 mg/dL — ABNORMAL HIGH (ref 8.4–10.5)
Chloride: 99 mEq/L (ref 96–112)
Creatinine, Ser: 1.78 mg/dL — ABNORMAL HIGH (ref 0.40–1.50)
GFR: 36.72 mL/min — ABNORMAL LOW (ref >60.00)
Glucose, Bld: 187 mg/dL — ABNORMAL HIGH (ref 70–99)
Potassium: 4.2 mEq/L (ref 3.5–5.1)
Sodium: 135 mEq/L (ref 135–145)

## 2022-10-30 LAB — POCT GLYCOSYLATED HEMOGLOBIN (HGB A1C): Hemoglobin A1C: 7.9 % — AB (ref 4.0–5.6)

## 2022-10-30 LAB — MICROALBUMIN / CREATININE URINE RATIO
Creatinine,U: 147.8 mg/dL
Microalb Creat Ratio: 20.1 mg/g (ref 0.0–30.0)
Microalb, Ur: 29.6 mg/dL — ABNORMAL HIGH (ref 0.0–1.9)

## 2022-10-30 MED ORDER — AMLODIPINE BESYLATE 10 MG PO TABS
10.0000 mg | ORAL_TABLET | Freq: Every day | ORAL | 2 refills | Status: DC
Start: 2022-10-30 — End: 2023-02-04
  Filled 2022-10-30 – 2022-12-02 (×2): qty 90, 90d supply, fill #0

## 2022-10-30 MED ORDER — EMPAGLIFLOZIN 10 MG PO TABS
10.0000 mg | ORAL_TABLET | Freq: Every day | ORAL | 1 refills | Status: DC
Start: 2022-10-30 — End: 2023-02-04
  Filled 2022-10-30: qty 90, 90d supply, fill #0

## 2022-10-30 MED ORDER — ROSUVASTATIN CALCIUM 20 MG PO TABS
20.0000 mg | ORAL_TABLET | Freq: Every day | ORAL | 3 refills | Status: DC
Start: 2022-10-30 — End: 2023-11-05
  Filled 2022-10-30 – 2022-11-18 (×2): qty 90, 90d supply, fill #0
  Filled 2023-03-05: qty 90, 90d supply, fill #1
  Filled 2023-05-30: qty 90, 90d supply, fill #2
  Filled 2023-08-23: qty 90, 90d supply, fill #0

## 2022-10-30 MED ORDER — IRBESARTAN-HYDROCHLOROTHIAZIDE 300-12.5 MG PO TABS
1.0000 | ORAL_TABLET | Freq: Every day | ORAL | 1 refills | Status: DC
Start: 2022-10-30 — End: 2023-07-23
  Filled 2022-10-30 – 2023-01-20 (×2): qty 90, 90d supply, fill #0
  Filled 2023-04-23: qty 90, 90d supply, fill #1

## 2022-10-30 MED ORDER — METFORMIN HCL ER 500 MG PO TB24
1000.0000 mg | ORAL_TABLET | Freq: Every day | ORAL | 1 refills | Status: DC
Start: 2022-10-30 — End: 2023-06-27
  Filled 2022-10-30 – 2023-01-07 (×2): qty 180, 90d supply, fill #0
  Filled 2023-03-30: qty 180, 90d supply, fill #1

## 2022-10-30 NOTE — Progress Notes (Signed)
New Patient Office Visit  Subjective    Patient ID: [name] [name], male    DOB: May 28, 1945  Age: 77 y.o. MRN: [id number]  CC:  Chief Complaint  Patient presents with   Establish Care    HPI [name] [name] presents to establish care He was seeing Dr. Hyacinth Meeker, had his last set of blood work in November 2023. He has a history of diabetes, last A1C was 7.3, today it is 7.9, patient states that he tried to stay active, exercises once a week, plays [place]onk at the park, does yard work, but the heat is making it difficult to go outside to exercise.   Patient has a long history of CAD, had tripe bypass surgery in 2017, states he continues to follow with his cardiologist regularly. States that he does have a BP machine at home but hasn't checked it much.  I extensively reviewed Dr. Rondel Baton notes, his last set of labs, cardiology notes, etc. Pt states that today he does not have any new symptoms, is in his normal state of health. We reviewed his medications, he reports he is doing well on his meds, reports that he needs refills of all of his medication.    Current Outpatient Medications  Medication Instructions   amLODipine (NORVASC) 10 mg, Oral, Daily   aspirin 81 mg, Oral, Daily   Cyanocobalamin (B-12) 2500 MCG TABS 1 tablet, Oral, 3 times weekly   irbesartan-hydrochlorothiazide (AVALIDE) 300-12.5 MG tablet 1 tablet, Oral, Daily   Jardiance 10 mg, Oral, Daily before breakfast   ketoconazole (NIZORAL) 2 % cream Apply 1 Application topically once daily for 14 days   metFORMIN (GLUCOPHAGE-XR) 1,000 mg, Oral, Daily   metoprolol tartrate (LOPRESSOR) 37.5 mg, Oral, 2 times daily   rosuvastatin (CRESTOR) 20 mg, Oral, Daily   Vitamin D 2,000 Units, Oral, 3 times weekly    Past Medical History:  Diagnosis Date   BPH (benign prostatic hyperplasia)    [facility]   Coronary artery disease    a. Myoview 4/17: EF 47%, anteroseptal, apical anterior, apical septal, apical scar with peri-infarct  ischemia, inferoseptal, inferior, inferolateral, apical inferior infarct with peri-infarct ischemia, high risk // b. LHC 4/17: Proximal RCA 99%, proximal LCx 100%, mid LAD 90%, distal LAD 60%, EF 55%   Diabetes mellitus    History of Doppler ultrasound    a. Carotid US 4/17:  bilateral ICA 1-39%   History of echocardiogram    a. Echo 4/17:  EF 60-65%, normal wall motion, grade 1 diastolic dysfunction   HLD (hyperlipidemia)    Hypertension    LBBB (left bundle branch block)    Nephrolithiasis     Past Surgical History:  Procedure Laterality Date   APPENDECTOMY  1976   CARDIAC CATHETERIZATION N/A 08/03/2015   Procedure: Left Heart Cath and Coronary Angiography;  Surgeon: Lyn Records, MD;  Location: [facility] INVASIVE CV LAB;  Service: Cardiovascular;  Laterality: N/A;   COLONOSCOPY     CORONARY ARTERY BYPASS GRAFT N/A 08/13/2015   Procedure: CORONARY ARTERY BYPASS GRAFTING (CABG) x 4 using left internal mammary artery and right leg saphenous vein;  Surgeon: Alleen Borne, MD;  Location: [facility] OR;  Service: Open Heart Surgery;  Laterality: N/A;   TEE WITHOUT CARDIOVERSION N/A 08/13/2015   Procedure: TRANSESOPHAGEAL ECHOCARDIOGRAM (TEE);  Surgeon: Alleen Borne, MD;  Location: [facility] OR;  Service: Open Heart Surgery;  Laterality: N/A;    Family History  Problem Relation Age of Onset   Heart failure Mother  75   Heart disease Mother    Heart disease Father    Heart attack Father 53       died from MI at 70   Cancer Sister     Social History   Socioeconomic History   Marital status: Married    Spouse name: Not on file   Number of children: Not on file   Years of education: Not on file   Highest education level: Not on file  Occupational History   Not on file  Tobacco Use   Smoking status: Never   Smokeless tobacco: Never  Vaping Use   Vaping status: Never Used  Substance and Sexual Activity   Alcohol use: No   Drug use: Never   Sexual activity: Not on file  Other Topics Concern    Not on file  Social History Narrative   Retired   Married (1987); 1 stepdaughter   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   Patient lives in a house with one other.    Patient does some exercise - yard and house work.    Patient does have living will, DNR, and HCPOA   Patient has no difficulty bathing, dressing, preparing/eating foods, managing medications, managing finances or affording medications.    Social Determinants of Health   Financial Resource Strain: Not on file  Food Insecurity: Not on file  Transportation Needs: Not on file  Physical Activity: Not on file  Stress: Not on file  Social Connections: Not on file  Intimate Partner Violence: Not on file    Review of Systems  All other systems reviewed and are negative.       Objective    BP (!) 100/52 (BP Location: Left Arm, Patient Position: Sitting, Cuff Size: Normal)   Pulse 60   Temp (!) 96.4 F (35.8 C) (Axillary)   Ht 5\' 7"  (1.702 m)   Wt 183 lb 12.8 oz (83.4 kg)   SpO2 97%   BMI 28.79 kg/m   Physical Exam Vitals reviewed.  Constitutional:      Appearance: Normal appearance. He is well-groomed and normal weight.  Eyes:     Extraocular Movements: Extraocular movements intact.     Conjunctiva/sclera: Conjunctivae normal.  Neck:     Thyroid: No thyromegaly.  Cardiovascular:     Rate and Rhythm: Normal rate and regular rhythm.     Heart sounds: S1 normal and S2 normal. No murmur heard. Pulmonary:     Effort: Pulmonary effort is normal.     Breath sounds: Normal breath sounds and air entry. No rales.  Abdominal:     General: Abdomen is flat. Bowel sounds are normal.  Musculoskeletal:     Right lower leg: No edema.     Left lower leg: No edema.  Neurological:     General: No focal deficit present.     Mental Status: He is alert and oriented to person, place, and time.      Gait: Gait is intact.  Psychiatric:        Mood and Affect: Mood and affect normal.         Assessment & Plan:  Diabetes mellitus type 2 with complications (HCC) Assessment & Plan: Last A1C 7.3, today it is 7.9. we discussed his medication, is currently on metformin 1000 mg BID only. We discussed adding jardiance 10 mg daily due to his  history of CAD and nephropathy from DM. He is agreeable to try this medication. Will order his annual lab work and see him back in 3 months for repeat A1C check.   Orders: -     POCT glycosylated hemoglobin (Hb A1C) -     metFORMIN HCl ER; Take 2 tablets (1,000 mg total) by mouth daily.  Dispense: 180 tablet; Refill: 1 -     Empagliflozin; Take 1 tablet (10 mg total) by mouth daily before breakfast.  Dispense: 90 tablet; Refill: 1 -     Microalbumin / creatinine urine ratio  Other hyperlipidemia Assessment & Plan: Continue crestor daily, pt has a history of CAD, needs this for secondary prevention of future cardiac events. He denies any swelling or orthopnea.   Orders: -     Rosuvastatin Calcium; Take 1 tablet (20 mg total) by mouth daily.  Dispense: 90 tablet; Refill: 3  Essential hypertension Assessment & Plan: Chronic, well controlled, continue amlodipine and avalide daily for BP control. Checking CMP today  Orders: -     amLODIPine Besylate; Take 1 tablet (10 mg total) by mouth daily.  Dispense: 90 tablet; Refill: 2 -     Irbesartan-hydroCHLOROthiazide; Take 1 tablet by mouth daily.  Dispense: 90 tablet; Refill: 1  Diabetic nephropathy associated with type 2 diabetes mellitus (HCC) -     Basic metabolic panel  Last Creatinine was trending up on his labs, I will order repeat labs to monitor the trend. We are starting jardiance today which should help provide some kidney protection, will continue to monitor kidney function every 6 months. I spent 45 minutes extensively reviewing the patient's PMH, previous notes from Dr. Hyacinth Meeker and his  cardiologist. I also reviewed his previous labs.  Return in about 3 months (around 01/30/2023) for DM-- A1C recheck.   Karie Georges, MD

## 2022-10-31 ENCOUNTER — Encounter: Payer: Self-pay | Admitting: Family Medicine

## 2022-11-03 NOTE — Assessment & Plan Note (Signed)
Chronic, well controlled, continue amlodipine and avalide daily for BP control. Checking CMP today

## 2022-11-03 NOTE — Assessment & Plan Note (Signed)
Last A1C 7.3, today it is 7.9. we discussed his medication, is currently on metformin 1000 mg BID only. We discussed adding jardiance 10 mg daily due to his history of CAD and nephropathy from DM. He is agreeable to try this medication. Will order his annual lab work and see him back in 3 months for repeat A1C check.

## 2022-11-03 NOTE — Assessment & Plan Note (Signed)
Continue crestor daily, pt has a history of CAD, needs this for secondary prevention of future cardiac events. He denies any swelling or orthopnea.

## 2022-11-04 ENCOUNTER — Other Ambulatory Visit: Payer: PPO

## 2022-11-04 NOTE — Addendum Note (Signed)
Addended by: Karie Georges on: 11/04/2022 12:57 PM   Modules accepted: Orders

## 2022-11-18 ENCOUNTER — Other Ambulatory Visit (HOSPITAL_COMMUNITY): Payer: Self-pay

## 2022-11-18 DIAGNOSIS — L570 Actinic keratosis: Secondary | ICD-10-CM | POA: Diagnosis not present

## 2022-12-02 ENCOUNTER — Other Ambulatory Visit (HOSPITAL_COMMUNITY): Payer: Self-pay

## 2023-01-07 ENCOUNTER — Other Ambulatory Visit (HOSPITAL_COMMUNITY): Payer: Self-pay

## 2023-01-20 ENCOUNTER — Other Ambulatory Visit (HOSPITAL_COMMUNITY): Payer: Self-pay

## 2023-01-21 ENCOUNTER — Other Ambulatory Visit (HOSPITAL_COMMUNITY): Payer: Self-pay

## 2023-01-23 ENCOUNTER — Ambulatory Visit (INDEPENDENT_AMBULATORY_CARE_PROVIDER_SITE_OTHER): Payer: PPO

## 2023-01-23 DIAGNOSIS — Z23 Encounter for immunization: Secondary | ICD-10-CM | POA: Diagnosis not present

## 2023-02-04 ENCOUNTER — Ambulatory Visit: Payer: PPO | Admitting: Family Medicine

## 2023-02-04 ENCOUNTER — Other Ambulatory Visit (HOSPITAL_COMMUNITY): Payer: Self-pay

## 2023-02-04 ENCOUNTER — Encounter: Payer: Self-pay | Admitting: Family Medicine

## 2023-02-04 VITALS — BP 108/50 | HR 59 | Temp 97.8°F | Ht 67.0 in | Wt 183.3 lb

## 2023-02-04 DIAGNOSIS — I10 Essential (primary) hypertension: Secondary | ICD-10-CM | POA: Diagnosis not present

## 2023-02-04 DIAGNOSIS — E118 Type 2 diabetes mellitus with unspecified complications: Secondary | ICD-10-CM

## 2023-02-04 DIAGNOSIS — N1832 Chronic kidney disease, stage 3b: Secondary | ICD-10-CM

## 2023-02-04 DIAGNOSIS — Z7984 Long term (current) use of oral hypoglycemic drugs: Secondary | ICD-10-CM

## 2023-02-04 LAB — BASIC METABOLIC PANEL
BUN: 32 mg/dL — ABNORMAL HIGH (ref 6–23)
CO2: 26 meq/L (ref 19–32)
Calcium: 10.3 mg/dL (ref 8.4–10.5)
Chloride: 101 meq/L (ref 96–112)
Creatinine, Ser: 1.94 mg/dL — ABNORMAL HIGH (ref 0.40–1.50)
GFR: 33.05 mL/min — ABNORMAL LOW (ref >60.00)
Glucose, Bld: 158 mg/dL — ABNORMAL HIGH (ref 70–99)
Potassium: 3.8 meq/L (ref 3.5–5.1)
Sodium: 138 meq/L (ref 135–145)

## 2023-02-04 LAB — POCT GLYCOSYLATED HEMOGLOBIN (HGB A1C): Hemoglobin A1C: 8.2 % — AB (ref 4.0–5.6)

## 2023-02-04 MED ORDER — AMLODIPINE BESYLATE 5 MG PO TABS
5.0000 mg | ORAL_TABLET | Freq: Every day | ORAL | Status: DC
Start: 2023-02-04 — End: 2023-04-23

## 2023-02-04 MED ORDER — EMPAGLIFLOZIN 25 MG PO TABS
25.0000 mg | ORAL_TABLET | Freq: Every day | ORAL | 1 refills | Status: DC
Start: 2023-02-04 — End: 2023-07-24
  Filled 2023-02-04: qty 90, 90d supply, fill #0
  Filled 2023-05-03: qty 90, 90d supply, fill #1

## 2023-02-04 NOTE — Progress Notes (Signed)
Established Patient Office Visit  Subjective   Patient ID: [name] [name], male    DOB: [birth year]  Age: 77 y.o. MRN: [id number]  Chief Complaint  Patient presents with   Medical Management of Chronic Issues    Pt is here for 6 month follow up today. He reports he is doing well. No new symptoms or issues to report.  DM-- pt has been taking the jardiance 10 mg daily, along wit hthe 1000 mg metformin daily. He denies any side effects to the medication, although he does report that he occasional dizziness when he stands up from a sitting position. Foot exam performed today.  HTN-- pt has a BP cuff at home but hasn't checked his BP at home recently. Continues on multiple medications, last BP 3 months ago was 100 systolic and today he is around 108. He is reporting occasional dizziness with standing, denies chest pain or SOB. Reports compliance with his meds.  CKD stage 3b -- last Cr was reviewed with patient, he reports a normal amount of urination, reports he is staying hydrated, no orthopnea, no swelling in his ankles, needs repeat BMP today.     Current Outpatient Medications  Medication Instructions   amLODipine (NORVASC) 5 mg, Oral, Daily   aspirin 81 mg, Oral, Daily   Cyanocobalamin (B-12) 2500 MCG TABS 1 tablet, Oral, 3 times weekly   empagliflozin (JARDIANCE) 25 mg, Oral, Daily before breakfast   irbesartan-hydrochlorothiazide (AVALIDE) 300-12.5 MG tablet 1 tablet, Oral, Daily   metFORMIN (GLUCOPHAGE-XR) 1,000 mg, Oral, Daily   metoprolol tartrate (LOPRESSOR) 37.5 mg, Oral, 2 times daily   rosuvastatin (CRESTOR) 20 mg, Oral, Daily   Vitamin D 2,000 Units, Oral, 3 times weekly    Patient Active Problem List   Diagnosis Date Noted   CKD stage 3b, GFR 30-44 ml/min (HCC) 02/04/2023   Encounter for therapeutic drug monitoring 08/20/2015   Coronary artery disease involving coronary bypass graft of native heart with angina pectoris (HCC) 08/13/2015   Diabetes mellitus type 2 with  complications (HCC) 08/03/2015   Hyperlipidemia 08/03/2015   Essential hypertension 08/03/2015   Atrial fibrillation (HCC)    Tachycardia       Review of Systems  All other systems reviewed and are negative.     Objective:     BP (!) 108/50 (BP Location: Left Arm, Patient Position: Sitting, Cuff Size: Normal)   Pulse (!) 59   Temp 97.8 F (36.6 C) (Oral)   Ht 5\' 7"  (1.702 m)   Wt 183 lb 4.8 oz (83.1 kg)   SpO2 97%   BMI 28.71 kg/m    Physical Exam Vitals reviewed.  Constitutional:      Appearance: Normal appearance. He is well-groomed and normal weight.  Cardiovascular:     Rate and Rhythm: Normal rate and regular rhythm.     Heart sounds: S1 normal and S2 normal. No murmur heard. Pulmonary:     Effort: Pulmonary effort is normal.     Breath sounds: Normal air entry. No rales.  Musculoskeletal:     Right lower leg: No edema.     Left lower leg: No edema.  Neurological:     General: No focal deficit present.     Mental Status: He is alert and oriented to person, place, and time.     Gait: Gait is intact.  Psychiatric:        Mood and Affect: Mood and affect normal.      Results for orders placed or performed  in visit on 02/04/23  POC HgB A1c  Result Value Ref Range   Hemoglobin A1C 8.2 (A) 4.0 - 5.6 %   HbA1c POC (<> result, manual entry)     HbA1c, POC (prediabetic range)     HbA1c, POC (controlled diabetic range)        The ASCVD Risk score (Arnett DK, et al., 2019) failed to calculate for the following reasons:   The valid total cholesterol range is 130 to 320 mg/dL    Assessment & Plan:  Diabetes mellitus type 2 with complications (HCC) Assessment & Plan: A1C is actually slightly worse today, my impression is that the jardiance dose is not strong enough, will increase to 25 mg daily on this medication and see him back in 3 months for repeat A1C.   Orders: -     POCT glycosylated hemoglobin (Hb A1C) -     Empagliflozin; Take 1 tablet (25 mg  total) by mouth daily before breakfast.  Dispense: 90 tablet; Refill: 1  CKD stage 3b, GFR 30-44 ml/min (HCC) -     Basic metabolic panel  Essential hypertension Assessment & Plan: Chronic, BP is actually on the low side and he is reporting some orthostasis now, will decrease his amlodipine to 5 mg daily, continue meds below. Current hypertension medications:       Sig   irbesartan-hydrochlorothiazide (AVALIDE) 300-12.5 MG tablet (Taking) Take 1 tablet by mouth daily.   metoprolol tartrate (LOPRESSOR) 25 MG tablet (Taking) Take 1.5 tablets (37.5 mg total) by mouth 2 (two) times daily.   amLODipine (NORVASC) 5 MG tablet Take 1 tablet (5 mg total) by mouth daily.        Orders: -     amLODIPine Besylate; Take 1 tablet (5 mg total) by mouth daily.     Return in about 3 months (around 05/07/2023) for DM, HTN.    Karie Georges, MD

## 2023-02-04 NOTE — Assessment & Plan Note (Signed)
Chronic, BP is actually on the low side and he is reporting some orthostasis now, will decrease his amlodipine to 5 mg daily, continue meds below. Current hypertension medications:       Sig   irbesartan-hydrochlorothiazide (AVALIDE) 300-12.5 MG tablet (Taking) Take 1 tablet by mouth daily.   metoprolol tartrate (LOPRESSOR) 25 MG tablet (Taking) Take 1.5 tablets (37.5 mg total) by mouth 2 (two) times daily.   amLODipine (NORVASC) 5 MG tablet Take 1 tablet (5 mg total) by mouth daily.

## 2023-02-04 NOTE — Assessment & Plan Note (Signed)
A1C is actually slightly worse today, my impression is that the jardiance dose is not strong enough, will increase to 25 mg daily on this medication and see him back in 3 months for repeat A1C.

## 2023-02-04 NOTE — Patient Instructions (Signed)
Increase Jardiance to 25 mg (2.5 tablets of the 10 mg dose) I called in a new script for the 25 mg tablets  REDUCE amlodipine to 5 mg (1/2 tablet of the 10 mg dose) daily

## 2023-02-09 ENCOUNTER — Telehealth: Payer: Self-pay | Admitting: Family Medicine

## 2023-02-09 NOTE — Addendum Note (Signed)
Addended by: Johnella Moloney on: 02/09/2023 04:44 PM   Modules accepted: Orders

## 2023-02-09 NOTE — Telephone Encounter (Signed)
See results note. 

## 2023-02-09 NOTE — Telephone Encounter (Signed)
Pt returned call for results.

## 2023-03-02 DIAGNOSIS — N1832 Chronic kidney disease, stage 3b: Secondary | ICD-10-CM | POA: Diagnosis not present

## 2023-03-04 ENCOUNTER — Other Ambulatory Visit: Payer: Self-pay | Admitting: Nephrology

## 2023-03-04 DIAGNOSIS — M899 Disorder of bone, unspecified: Secondary | ICD-10-CM | POA: Diagnosis not present

## 2023-03-04 DIAGNOSIS — N1832 Chronic kidney disease, stage 3b: Secondary | ICD-10-CM | POA: Diagnosis not present

## 2023-03-04 DIAGNOSIS — I129 Hypertensive chronic kidney disease with stage 1 through stage 4 chronic kidney disease, or unspecified chronic kidney disease: Secondary | ICD-10-CM | POA: Diagnosis not present

## 2023-03-04 DIAGNOSIS — E1122 Type 2 diabetes mellitus with diabetic chronic kidney disease: Secondary | ICD-10-CM | POA: Diagnosis not present

## 2023-03-04 DIAGNOSIS — D631 Anemia in chronic kidney disease: Secondary | ICD-10-CM | POA: Diagnosis not present

## 2023-03-05 ENCOUNTER — Ambulatory Visit
Admission: RE | Admit: 2023-03-05 | Discharge: 2023-03-05 | Disposition: A | Discharge status: Home / Self Care | Payer: PPO | Source: Ambulatory Visit | Attending: Nephrology | Admitting: Nephrology

## 2023-03-05 DIAGNOSIS — N189 Chronic kidney disease, unspecified: Secondary | ICD-10-CM | POA: Diagnosis not present

## 2023-03-05 DIAGNOSIS — N1832 Chronic kidney disease, stage 3b: Secondary | ICD-10-CM

## 2023-03-10 NOTE — Progress Notes (Signed)
Cardiology Office Note:  .   Date:  03/24/2023  ID:  [name], [name] [birth year], MRN [id number] PCP: [name] Georges, MD   [facility] Providers Cardiologist:  Little Ishikawa, MD Cardiology APP:  Dyann Kief, PA-C    History of Present Illness: .   [name] [name] is a 77 y.o. male   with  a hx of  CAD s/p CABG with LIMA-LAD, SVG-PDA/PL 1, SVG-OM.2017, LBBB, HTN, HL, DM2, postoperative paroxysmal atrial fibrillation and previous amiodarone therapy. Coumadin therapy has been discontinued.  I saw patient 08/2022 and doing well.   Patient comes in with his wife. He's cut out sweets, tea, drinking water. Has lost 7 lbs since Oct 23 lbs since last year. Denies chest pain, palpitations, dyspnea, edema. Still playing petanque.Does a lot of yard work.  A1C was 8.4 and Crt 1.94. Is following with Dr. Allena Katz with renal.    ROS:    Studies Reviewed: Marland Kitchen         Prior CV Studies:   LEFT HEART CATH AND CORONARY ANGIOGRAPHY 08/03/2015   Narrative 1. Prox RCA lesion, 99% stenosed. 2. Prox Cx to Mid Cx lesion, 100% stenosed. 3. Mid LAD lesion, 90% stenosed. 4. Dist LAD lesion, 60% stenosed.    Severe three-vessel coronary disease in 77 year old diabetic patient with 90% proximal LAD arising after the second diagonal but before the first several perforated, total occlusion of the proximal circumflex, and high grade obstruction in the proximal RCA (dominant vessel)  Overall normal LV function with EF 55%     RECOMMENDATIONS:    CABG per TCTS   ECHO COMPLETE WO IMAGING ENHANCING AGENT 08/01/2015   Narrative *[facility]* [address] [place], [place] [address] [phone]   ------------------------------------------------------------------- Transthoracic Echocardiography   (Report amended )   Patient:    [name], [name] MR #:       [id number] Study Date: 08/01/2015 Gender:     M Age:        57 Height:     172.7 cm Weight:     85 kg BSA:         2.04 m^2 Pt. Status: Room:   ATTENDING    Kristeen Miss, M.D. SONOGRAPHER  Luvenia Redden, RDCS ORDERING     Alben Spittle, Scott T REFERRING    Alben Spittle, Scott T PERFORMING   [facility], Outpatient   cc:   ------------------------------------------------------------------- LV EF: 60% -   65%   ------------------------------------------------------------------- Indications:      LBBB (I44.7).  SOB (R06.02).   ------------------------------------------------------------------- History:   PMH:  Acquired from the patient and from the patient&'s chart.  Dyspnea.  Risk factors:  Family history of coronary artery disease. Hypertension. Diabetes mellitus. Dyslipidemia.   ------------------------------------------------------------------- Study Conclusions   - Left ventricle: The cavity size was normal. Wall thickness was normal. Systolic function was normal. The estimated ejection fraction was in the range of 60% to 65%. Wall motion was normal; there were no regional wall motion abnormalities. Doppler parameters are consistent with abnormal left ventricular relaxation (grade 1 diastolic dysfunction).   Transthoracic echocardiography.  M-mode, complete 2D, spectral Doppler, and color Doppler.  Birthdate:  Patient birthdate: 1945-10-18.  Age:  Patient is 77 yr old.  Sex:  Gender: male. BMI: 28.5 kg/m^2.  Blood pressure:     142/60  Patient status: Outpatient.  Study date:  Study date: 08/01/2015. Study time: 08:27 AM.  Location:  [name]   -------------------------------------------------------------------   ------------------------------------------------------------------- Left  ventricle:  The cavity size was normal. Wall thickness was normal. Systolic function was normal. The estimated ejection fraction was in the range of 60% to 65%. Wall motion was normal; there were no regional wall motion abnormalities. Doppler parameters are consistent with abnormal left ventricular  relaxation (grade 1 diastolic dysfunction).   ------------------------------------------------------------------- Aortic valve:   Mildly thickened, mildly calcified leaflets. Doppler:   There was no stenosis.   ------------------------------------------------------------------- Aorta:  Aortic root: The aortic root was normal in size. Ascending aorta: The ascending aorta was normal in size.   ------------------------------------------------------------------- Mitral valve:   Structurally normal valve.   Leaflet separation was normal.  Doppler:  Transvalvular velocity was within the normal range. There was no evidence for stenosis. There was no regurgitation.   ------------------------------------------------------------------- Left atrium:  The atrium was normal in size.   ------------------------------------------------------------------- Right ventricle:  The cavity size was normal. Systolic function was normal.   ------------------------------------------------------------------- Pulmonic valve:    The valve appears to be grossly normal. Doppler:  There was no significant regurgitation.   ------------------------------------------------------------------- Tricuspid valve:   The valve appears to be grossly normal. Doppler:  There was trivial regurgitation.   ------------------------------------------------------------------- Right atrium:  The atrium was normal in size.   ------------------------------------------------------------------- Pericardium:  There was no pericardial effusion.   ------------------------------------------------------------------- Measurements   Left ventricle                         Value        Reference LV ID, ED, PLAX chordal        (L)     37    mm     43 - 52 LV ID, ES, PLAX chordal                26    mm     23 - 38 LV fx shortening, PLAX chordal         30    %      >=29 LV PW thickness, ED                    10    mm      --------- IVS/LV PW ratio, ED                    0.7          <=1.3 LV e&', lateral                         6.64  cm/s   --------- LV E/e&', lateral                       9.86         --------- LV e&', medial                          2.61  cm/s   --------- LV E/e&', medial                        25.1         --------- LV e&', average                         4.63  cm/s   --------- LV E/e&', average  14.16        ---------   Ventricular septum                     Value        Reference IVS thickness, ED                      7     mm     ---------   Aorta                                  Value        Reference Aortic root ID                         38    mm     ---------   Left atrium                            Value        Reference LA ID, A-P, ES                         30    mm     --------- LA ID/bsa, A-P                         1.47  cm/m^2 <=2.2   Mitral valve                           Value        Reference Mitral E-wave peak velocity            65.5  cm/s   --------- Mitral A-wave peak velocity            96.5  cm/s   --------- Mitral E/A ratio, peak                 0.68         ---------   Systemic veins                         Value        Reference Estimated CVP                          3     mm Hg  ---------   Right ventricle                        Value        Reference RV s&', lateral, S                      11    cm/s   ---------   Legend: (L)  and  (H)  mark values outside specified reference range.   ------------------------------------------------------------------- Deno Lunger, M.D. 2017-04-12T09:45:53       Risk Assessment/Calculations:             Physical Exam:   VS:  BP 124/68 (BP Location: Left Arm, Patient Position: Sitting, Cuff Size: Normal)   Pulse 68   Resp 16   Ht 5\' 7"  (1.702 m)  Wt 177 lb (80.3 kg)   SpO2 96%   BMI 27.72 kg/m    Wt Readings from Last 3 Encounters:  03/24/23 177 lb (80.3 kg)   02/04/23 183 lb 4.8 oz (83.1 kg)  10/30/22 183 lb 12.8 oz (83.4 kg)    GEN: Well nourished, well developed in no acute distress NECK: No JVD; No carotid bruits CARDIAC:  RRR, no murmurs, rubs, gallops RESPIRATORY:  Clear to auscultation without rales, wheezing or rhonchi  ABDOMEN: Soft, non-tender, non-distended EXTREMITIES:  No edema; No deformity   ASSESSMENT AND PLAN: .    Coronary artery disease involving coronary bypass graft of native heart with angina pectoris (HCC)  -CABG 2017 -no angina   Diabetes mellitus type 2 with complications (HCC)  A1C 8.2 01/2023 -has stopped drinking sweet tea and lost 7 lbs. For repeat in Jan.   Other hyperlipidemia  LDL  47 02/2022-due for repeat-he will ask Dr. Allena Katz to do in Jan. Continue crestor   Essential hypertension  -well controlled on metoprolol and amlodipine          Dispo: f/u in 6 months  Signed, Jacolyn Reedy, PA-C

## 2023-03-24 ENCOUNTER — Encounter: Payer: Self-pay | Admitting: Family Medicine

## 2023-03-24 ENCOUNTER — Ambulatory Visit: Payer: PPO | Attending: Physician Assistant | Admitting: Physician Assistant

## 2023-03-24 ENCOUNTER — Encounter: Payer: Self-pay | Admitting: Physician Assistant

## 2023-03-24 VITALS — BP 124/68 | HR 68 | Resp 16 | Ht 67.0 in | Wt 177.0 lb

## 2023-03-24 DIAGNOSIS — E7849 Other hyperlipidemia: Secondary | ICD-10-CM | POA: Diagnosis not present

## 2023-03-24 DIAGNOSIS — I10 Essential (primary) hypertension: Secondary | ICD-10-CM

## 2023-03-24 DIAGNOSIS — I25709 Atherosclerosis of coronary artery bypass graft(s), unspecified, with unspecified angina pectoris: Secondary | ICD-10-CM | POA: Diagnosis not present

## 2023-03-24 DIAGNOSIS — E118 Type 2 diabetes mellitus with unspecified complications: Secondary | ICD-10-CM | POA: Diagnosis not present

## 2023-03-24 NOTE — Patient Instructions (Signed)
Medication Instructions:   Your physician recommends that you continue on your current medications as directed. Please refer to the Current Medication list given to you today.   *If you need a refill on your cardiac medications before your next appointment, please call your pharmacy*   Lab Work:  None ordered.  If you have labs (blood work) drawn today and your tests are completely normal, you will receive your results only by: MyChart Message (if you have MyChart) OR A paper copy in the mail If you have any lab test that is abnormal or we need to change your treatment, we will call you to review the results.   Testing/Procedures:  None    Follow-Up: At [facility], you and your health needs are our priority.  As part of our continuing mission to provide you with exceptional heart care, we have created designated Provider Care Teams.  These Care Teams include your primary Cardiologist (physician) and Advanced Practice Providers (APPs -  Physician Assistants and Nurse Practitioners) who all work together to provide you with the care you need, when you need it.  We recommend signing up for the patient portal called "MyChart".  Sign up information is provided on this After Visit Summary.  MyChart is used to connect with patients for Virtual Visits (Telemedicine).  Patients are able to view lab/test results, encounter notes, upcoming appointments, etc.  Non-urgent messages can be sent to your provider as well.   To learn more about what you can do with MyChart, go to [email or web address].    Your next appointment:   6 month(s)  Provider:   Dr. Bjorn Pippin or Jacolyn Reedy, PA     Other Instructions  Your physician wants you to follow-up in: 6 months.  You will receive a reminder letter in the mail two months in advance. If you don't receive a letter, please call our office to schedule the follow-up appointment.

## 2023-03-25 ENCOUNTER — Ambulatory Visit: Payer: PPO | Admitting: Family Medicine

## 2023-04-23 ENCOUNTER — Other Ambulatory Visit (HOSPITAL_COMMUNITY): Payer: Self-pay

## 2023-04-23 ENCOUNTER — Other Ambulatory Visit: Payer: Self-pay

## 2023-04-23 MED ORDER — AMLODIPINE BESYLATE 5 MG PO TABS
5.0000 mg | ORAL_TABLET | Freq: Every day | ORAL | 1 refills | Status: DC
  Filled 2023-04-23 – 2023-07-23 (×2): qty 90, 90d supply, fill #0

## 2023-04-24 ENCOUNTER — Other Ambulatory Visit (HOSPITAL_COMMUNITY): Payer: Self-pay

## 2023-04-30 DIAGNOSIS — N1832 Chronic kidney disease, stage 3b: Secondary | ICD-10-CM | POA: Diagnosis not present

## 2023-05-04 ENCOUNTER — Ambulatory Visit (INDEPENDENT_AMBULATORY_CARE_PROVIDER_SITE_OTHER): Payer: PPO | Admitting: Family Medicine

## 2023-05-04 ENCOUNTER — Other Ambulatory Visit (HOSPITAL_COMMUNITY): Payer: Self-pay

## 2023-05-04 ENCOUNTER — Encounter: Payer: Self-pay | Admitting: Family Medicine

## 2023-05-04 VITALS — BP 110/70 | HR 60 | Temp 97.4°F | Ht 67.0 in | Wt 177.4 lb

## 2023-05-04 DIAGNOSIS — E7849 Other hyperlipidemia: Secondary | ICD-10-CM

## 2023-05-04 DIAGNOSIS — E118 Type 2 diabetes mellitus with unspecified complications: Secondary | ICD-10-CM | POA: Diagnosis not present

## 2023-05-04 LAB — POCT GLYCOSYLATED HEMOGLOBIN (HGB A1C): Hemoglobin A1C: 7.1 % — AB (ref 4.0–5.6)

## 2023-05-04 LAB — LIPID PANEL
Cholesterol: 110 mg/dL (ref 0–200)
HDL: 32.6 mg/dL — ABNORMAL LOW (ref >39.00)
LDL Cholesterol: 46 mg/dL (ref 0–99)
NonHDL: 77.02
Total CHOL/HDL Ratio: 3
Triglycerides: 153 mg/dL — ABNORMAL HIGH (ref 0.0–149.0)
VLDL: 30.6 mg/dL (ref 0.0–40.0)

## 2023-05-04 NOTE — Progress Notes (Signed)
 Established Patient Office Visit  Subjective   Patient ID: [name], male    DOB: 1945/09/24  Age: 78 y.o. MRN: [id number]  Chief Complaint  Patient presents with   Medical Management of Chronic Issues   Medication Problem    Patient states he has been taking Metoprolol  50mg  BID versus 37.5mg  as recommended by PCP    Pt is here for follow up on his DM.  He reports he stopped drinking sweet tea and regular soda, A1C is down to 7.1 today with the dietary changes. Is continuing to see his specialists, last Cr was done recently, will ask for records. He is due for his lipid panel today.     Current Outpatient Medications  Medication Instructions   amLODipine  (NORVASC ) 5 mg, Oral, Daily   aspirin  81 mg, Daily   Cyanocobalamin (B-12) 2500 MCG TABS 1 tablet, 3 times weekly   irbesartan -hydrochlorothiazide  (AVALIDE) 300-12.5 MG tablet 1 tablet, Oral, Daily   Jardiance  25 mg, Oral, Daily before breakfast   metFORMIN  (GLUCOPHAGE -XR) 1,000 mg, Oral, Daily   metoprolol  tartrate (LOPRESSOR ) 37.5 mg, Oral, 2 times daily   rosuvastatin  (CRESTOR ) 20 mg, Oral, Daily   Vitamin D 2,000 Units, 3 times weekly    Patient Active Problem List   Diagnosis Date Noted   CKD stage 3b, GFR 30-44 ml/min (HCC) 02/04/2023   Encounter for therapeutic drug monitoring 08/20/2015   Coronary artery disease involving coronary 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     Objective:     BP 110/70   Pulse 60   Temp (!) 97.4 F (36.3 C) (Oral)   Ht 5' 7 (1.702 m)   Wt 177 lb 6.4 oz (80.5 kg)   SpO2 99%   BMI 27.78 kg/m    Physical Exam Vitals reviewed.  Constitutional:      Appearance: Normal appearance. He is normal weight.   Cardiovascular:     Rate and Rhythm: Normal rate and regular rhythm.     Heart sounds: Normal heart sounds.  Pulmonary:     Effort: Pulmonary effort is normal.     Breath sounds: Normal breath sounds.  Musculoskeletal:     Right lower leg: No edema.     Left lower leg: No edema.  Neurological:     Mental Status: He is alert and oriented to person, place, and time.  Psychiatric:        Mood and Affect: Mood normal.    Last metabolic panel Lab Results  Component Value Date   GLUCOSE 158 (H) 02/04/2023   NA 138 02/04/2023   K 3.8 02/04/2023   CL 101 02/04/2023   CO2 26 02/04/2023   BUN 32 (H) 02/04/2023   CREATININE 1.94 (H) 02/04/2023   GFR 33.05 (L) 02/04/2023   CALCIUM  10.3 02/04/2023   PROT 7.4 03/20/2022   ALBUMIN  4.7 08/18/2016   LABGLOB 2.3 08/18/2016   AGRATIO 2.0 08/18/2016   BILITOT 0.7 03/20/2022   ALKPHOS 94 08/18/2016   AST 21 03/20/2022   ALT 25 03/20/2022   ANIONGAP 11 08/18/2015      The ASCVD Risk score (Arnett DK, et al., 2019) failed to calculate for the following reasons:   The valid total  cholesterol range is 130 to 320 mg/dL    Assessment & Plan:  Diabetes mellitus type 2 with complications (HCC) Assessment & Plan: A1C is much better due to his dietary changes. I encouraged him to continue his dietary changes as it significantly helped with his A1C. Lipid panel ordered  Orders: -     POCT glycosylated hemoglobin (Hb A1C)  Other hyperlipidemia -     Lipid panel     Return in about 6 months (around 11/01/2023) for annual physical exam.    Heron CHRISTELLA Sharper, MD

## 2023-05-04 NOTE — Assessment & Plan Note (Signed)
 A1C is much better due to his dietary changes. I encouraged him to continue his dietary changes as it significantly helped with his A1C. Lipid panel ordered

## 2023-05-07 DIAGNOSIS — E1122 Type 2 diabetes mellitus with diabetic chronic kidney disease: Secondary | ICD-10-CM | POA: Diagnosis not present

## 2023-05-07 DIAGNOSIS — N1832 Chronic kidney disease, stage 3b: Secondary | ICD-10-CM | POA: Diagnosis not present

## 2023-05-07 DIAGNOSIS — N189 Chronic kidney disease, unspecified: Secondary | ICD-10-CM | POA: Diagnosis not present

## 2023-05-07 DIAGNOSIS — I129 Hypertensive chronic kidney disease with stage 1 through stage 4 chronic kidney disease, or unspecified chronic kidney disease: Secondary | ICD-10-CM | POA: Diagnosis not present

## 2023-05-07 DIAGNOSIS — D631 Anemia in chronic kidney disease: Secondary | ICD-10-CM | POA: Diagnosis not present

## 2023-05-07 DIAGNOSIS — N2581 Secondary hyperparathyroidism of renal origin: Secondary | ICD-10-CM | POA: Diagnosis not present

## 2023-06-15 ENCOUNTER — Ambulatory Visit (INDEPENDENT_AMBULATORY_CARE_PROVIDER_SITE_OTHER): Payer: PPO

## 2023-06-15 VITALS — BP 120/60 | HR 62 | Temp 98.0°F | Ht 67.0 in | Wt 176.8 lb

## 2023-06-15 DIAGNOSIS — Z0000 Encounter for general adult medical examination without abnormal findings: Secondary | ICD-10-CM | POA: Diagnosis not present

## 2023-06-15 NOTE — Progress Notes (Signed)
 Subjective:   TANIS BURNLEY is a 78 y.o. male who presents for Medicare Annual/Subsequent preventive examination.  Visit Complete: In person    Cardiac Risk Factors include: advanced age (>25men, >48 women);male gender;diabetes mellitus;hypertension     Objective:    Today's Vitals   06/15/23 1120  BP: 120/60  Pulse: 62  Temp: 98 F (36.7 C)  TempSrc: Oral  SpO2: 95%  Weight: 176 lb 12.8 oz (80.2 kg)  Height: 5\' 7"  (1.702 m)   Body mass index is 27.69 kg/m.     06/15/2023   11:38 AM 08/13/2015    6:12 AM 08/10/2015    8:09 AM 08/03/2015    7:45 AM  Advanced Directives  Does Patient Have a Medical Advance Directive? Yes Yes Yes Yes  Type of Estate agent of [place];Living will Living will;Healthcare Power of [employer] Power of [place];Living will Healthcare Power of [place];Living will  Does patient want to make changes to medical advance directive?   No - Patient declined No - Patient declined  Copy of Healthcare Power of Attorney in Chart? No - copy requested  -- No - copy requested    Current Medications (verified) Outpatient Encounter Medications as of 06/15/2023  Medication Sig   amLODipine (NORVASC) 5 MG tablet Take 1 tablet (5 mg total) by mouth daily.   aspirin 81 MG tablet Take 81 mg by mouth daily.   Cholecalciferol (VITAMIN D) 2000 units tablet Take 2,000 Units by mouth 3 (three) times a week.    Cyanocobalamin (B-12) 2500 MCG TABS Take 1 tablet by mouth 3 (three) times a week.   empagliflozin (JARDIANCE) 25 MG TABS tablet Take 1 tablet (25 mg total) by mouth daily before breakfast.   irbesartan-hydrochlorothiazide (AVALIDE) 300-12.5 MG tablet Take 1 tablet by mouth daily.   metFORMIN (GLUCOPHAGE-XR) 500 MG 24 hr tablet Take 2 tablets (1,000 mg total) by mouth daily.   metoprolol tartrate (LOPRESSOR) 25 MG tablet Take 1.5 tablets (37.5 mg total) by mouth 2 (two) times daily.   rosuvastatin (CRESTOR) 20 MG tablet Take 1  tablet (20 mg total) by mouth daily.   No facility-administered encounter medications on file as of 06/15/2023.    Allergies (verified) Septra [sulfamethoxazole-trimethoprim]   History: Past Medical History:  Diagnosis Date   BPH (benign prostatic hyperplasia)    [facility]   Coronary artery disease    a. Myoview 4/17: EF 47%, anteroseptal, apical anterior, apical septal, apical scar with peri-infarct ischemia, inferoseptal, inferior, inferolateral, apical inferior infarct with peri-infarct ischemia, high risk // b. LHC 4/17: Proximal RCA 99%, proximal LCx 100%, mid LAD 90%, distal LAD 60%, EF 55%   Diabetes mellitus    History of Doppler ultrasound    a. Carotid US 4/17:  bilateral ICA 1-39%   History of echocardiogram    a. Echo 4/17:  EF 60-65%, normal wall motion, grade 1 diastolic dysfunction   HLD (hyperlipidemia)    Hypertension    LBBB (left bundle branch block)    Nephrolithiasis    Past Surgical History:  Procedure Laterality Date   APPENDECTOMY  1976   CARDIAC CATHETERIZATION N/A 08/03/2015   Procedure: Left Heart Cath and Coronary Angiography;  Surgeon: Lyn Records, MD;  Location: [facility] INVASIVE CV LAB;  Service: Cardiovascular;  Laterality: N/A;   COLONOSCOPY     CORONARY ARTERY BYPASS GRAFT N/A 08/13/2015   Procedure: CORONARY ARTERY BYPASS GRAFTING (CABG) x 4 using left internal mammary artery and right leg saphenous vein;  Surgeon: Alleen Borne, MD;  Location: [facility] OR;  Service: Open Heart Surgery;  Laterality: N/A;   TEE WITHOUT CARDIOVERSION N/A 08/13/2015   Procedure: TRANSESOPHAGEAL ECHOCARDIOGRAM (TEE);  Surgeon: Alleen Borne, MD;  Location: [facility] OR;  Service: Open Heart Surgery;  Laterality: N/A;   Family History  Problem Relation Age of Onset   Heart failure Mother 58   Heart disease Mother    Heart disease Father    Heart attack Father 60       died from MI at 27   Cancer Sister    Social History   Socioeconomic History   Marital status:  Married    Spouse name: Not on file   Number of children: Not on file   Years of education: Not on file   Highest education level: Not on file  Occupational History   Not on file  Tobacco Use   Smoking status: Never   Smokeless tobacco: Never  Vaping Use   Vaping status: Never Used  Substance and Sexual Activity   Alcohol use: No   Drug use: Never   Sexual activity: Not on file  Other Topics Concern   Not on file  Social History Narrative   Retired   Married (1987); 1 stepdaughter   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   Patient lives in a house with one other.    Patient does some exercise - yard and house work.    Patient does have living will, DNR, and HCPOA   Patient has no difficulty bathing, dressing, preparing/eating foods, managing medications, managing finances or affording medications.    Social Drivers of Corporate investment banker Strain: Low Risk  (06/15/2023)   Overall Financial Resource Strain (CARDIA)    Difficulty of Paying Living Expenses: Not hard at all  Food Insecurity: No Food Insecurity (06/15/2023)   Hunger Vital Sign    Worried About Running Out of Food in the Last Year: Never true    Ran Out of Food in the Last Year: Never true  Transportation Needs: No Transportation Needs (06/15/2023)   PRAPARE - Administrator, Civil Service (Medical): No    Lack of Transportation (Non-Medical): No  Physical Activity: Inactive (06/15/2023)   Exercise Vital Sign    Days of Exercise per Week: 0 days    Minutes of Exercise per Session: 0 min  Stress: No Stress Concern Present (06/15/2023)   [employer] of Occupational Health - Occupational Stress Questionnaire    Feeling of Stress : Not at all  Social Connections: Socially Integrated (06/15/2023)   Social Connection and Isolation Panel [NHANES]    Frequency of  Communication with Friends and Family: More than three times a week    Frequency of Social Gatherings with Friends and Family: More than three times a week    Attends Religious Services: More than 4 times per year    Active Member of [employer] or Organizations: Yes    Attends Engineer, structural: More than 4 times per year    Marital Status: Married    Tobacco Counseling Counseling given: Not Answered   Clinical Intake:  Pre-visit preparation completed: Yes  Pain : No/denies pain     BMI - recorded: 27.69 Nutritional Status: BMI 25 -29 Overweight Nutritional Risks: None Diabetes: Yes CBG done?: No Did pt. bring in CBG monitor  from home?: No  How often do you need to have someone help you when you read instructions, pamphlets, or other written materials from your doctor or pharmacy?: 1 - Never  Interpreter Needed?: No  Information entered by :: Theresa Mulligan LPN   Activities of Daily Living    06/15/2023   11:37 AM  In your present state of health, do you have any difficulty performing the following activities:  Hearing? 0  Vision? 0  Difficulty concentrating or making decisions? 0  Walking or climbing stairs? 0  Dressing or bathing? 0  Doing errands, shopping? 0  Preparing Food and eating ? N  Using the Toilet? N  In the past six months, have you accidently leaked urine? N  Do you have problems with loss of bowel control? N  Managing your Medications? N  Managing your Finances? N  Housekeeping or managing your Housekeeping? N    Patient Care Team: Karie Georges, MD as PCP - General (Family Medicine) Little Ishikawa, MD as PCP - Cardiology (Cardiology) Dyann Kief, PA-C as Physician Assistant (Cardiology)  Indicate any recent Medical Services you may have received from other than [facility] providers in the past year (date may be approximate).     Assessment:   This is a routine wellness examination for [place].  Hearing/Vision  screen Hearing Screening - Comments:: Denies hearing difficulties   Vision Screening - Comments:: Wears reading glasses - up to date with routine eye exams with  Dr Dione Booze   Goals Addressed               This Visit's Progress     Stay Active (pt-stated)         Depression Screen    06/15/2023   11:26 AM 02/04/2023    8:21 AM 10/30/2022    1:22 PM 06/19/2022   10:52 AM 06/19/2022   10:38 AM 03/20/2022   10:30 AM 03/05/2021    2:01 PM  PHQ 2/9 Scores  PHQ - 2 Score 0 0 0 0 0 0 0  PHQ- 9 Score  1 0 0       Fall Risk    06/15/2023   11:38 AM 02/04/2023    8:24 AM 10/30/2022    1:22 PM 06/19/2022   10:38 AM 03/20/2022   10:29 AM  Fall Risk   Falls in the past year? 0 0 0 0 0  Number falls in past yr: 0 0 0 0 0  Injury with Fall? 0 0 0 0 0  Risk for fall due to : No Fall Risks No Fall Risks No Fall Risks No Fall Risks No Fall Risks  Follow up Falls prevention discussed;Falls evaluation completed Falls evaluation completed Falls evaluation completed Falls evaluation completed Falls evaluation completed    MEDICARE RISK AT HOME: Medicare Risk at Home Any stairs in or around the home?: Yes If so, are there any without handrails?: No Home free of loose throw rugs in walkways, pet beds, electrical cords, etc?: Yes Adequate lighting in your home to reduce risk of falls?: Yes Life alert?: No Use of a cane, walker or w/c?: No Grab bars in the bathroom?: Yes Shower chair or bench in shower?: No Elevated toilet seat or a handicapped toilet?: No  TIMED UP AND GO:  Was the test performed?  Yes  Length of time to ambulate 10 feet: 10 sec Gait steady and fast without use of assistive device    Cognitive Function:    06/19/2022  10:42 AM  MMSE - Mini Mental State Exam  Orientation to time 5  Orientation to Place 5  Registration 3  Attention/ Calculation 5  Recall 3  Language- name 2 objects 2  Language- repeat 1  Language- follow 3 step command 3  Language- read &  follow direction 1  Write a sentence 1  Copy design 0  Total score 29        06/15/2023   11:38 AM  6CIT Screen  What Year? 0 points  What month? 0 points  What time? 0 points  Count back from 20 0 points  Months in reverse 0 points  Repeat phrase 0 points  Total Score 0 points    Immunizations Immunization History  Administered Date(s) Administered   Fluad Quad(high Dose 65+) 01/09/2021, 02/05/2022   Fluad Trivalent(High Dose 65+) 01/23/2023   Influenza,inj,Quad PF,6+ Mos 01/25/2016, 02/05/2017, 01/22/2018, 01/21/2019, 01/24/2020   Influenza,inj,quad, With Preservative 02/05/2015   Moderna SARS-COV2 Booster Vaccination 02/24/2020, 09/25/2020   Moderna Sars-Covid-2 Vaccination 06/03/2019, 07/01/2019   PFIZER(Purple Top)SARS-COV-2 Vaccination 02/04/2021   Pfizer(Comirnaty)Fall Seasonal Vaccine 12 years and older 02/05/2022   Pneumococcal Conjugate-13 02/22/2014   Pneumococcal Polysaccharide-23 01/19/2013   Tdap 12/01/2019   Zoster Recombinant(Shingrix) 12/18/2020, 05/27/2021   Zoster, Live 02/20/2012    TDAP status: Up to date  Flu Vaccine status: Up to date  Pneumococcal vaccine status: Up to date  Covid-19 vaccine status: Declined, Education has been provided regarding the importance of this vaccine but patient still declined. Advised may receive this vaccine at local pharmacy or Health Dept.or vaccine clinic. Aware to provide a copy of the vaccination record if obtained from local pharmacy or Health Dept. Verbalized acceptance and understanding.  Qualifies for Shingles Vaccine? Yes   Zostavax completed Yes   Shingrix Completed?: Yes  Screening Tests Health Maintenance  Topic Date Due   COVID-19 Vaccine (7 - 2024-25 season) 12/21/2022   Hepatitis C Screening  10/30/2023 (Originally 10/12/1963)   OPHTHALMOLOGY EXAM  07/22/2023   Diabetic kidney evaluation - Urine ACR  10/30/2023   HEMOGLOBIN A1C  11/01/2023   Diabetic kidney evaluation - eGFR measurement   02/04/2024   FOOT EXAM  02/04/2024   Medicare Annual Wellness (AWV)  06/14/2024   DTaP/Tdap/Td (2 - Td or Tdap) 11/30/2029   Pneumonia Vaccine 67+ Years old  Completed   INFLUENZA VACCINE  Completed   Zoster Vaccines- Shingrix  Completed   HPV VACCINES  Aged Out   Colonoscopy  Discontinued    Health Maintenance  Health Maintenance Due  Topic Date Due   COVID-19 Vaccine (7 - 2024-25 season) 12/21/2022     Additional Screening:  Hepatitis C Screening: does qualify;  Deferred  Vision Screening: Recommended annual ophthalmology exams for early detection of glaucoma and other disorders of the eye. Is the patient up to date with their annual eye exam?  Yes  Who is the provider or what is the name of the office in which the patient attends annual eye exams? Dr Dione Booze If pt is not established with a provider, would they like to be referred to a provider to establish care? No .   Dental Screening: Recommended annual dental exams for proper oral hygiene  Diabetic Foot Exam: Diabetic Foot Exam: Completed 02/04/23  Community Resource Referral / Chronic Care Management:  CRR required this visit?  No   CCM required this visit?  No     Plan:     I have personally reviewed and noted the following in the  patient's chart:   Medical and social history Use of alcohol, tobacco or illicit drugs  Current medications and supplements including opioid prescriptions. Patient is not currently taking opioid prescriptions. Functional ability and status Nutritional status Physical activity Advanced directives List of other physicians Hospitalizations, surgeries, and ER visits in previous 12 months Vitals Screenings to include cognitive, depression, and falls Referrals and appointments  In addition, I have reviewed and discussed with patient certain preventive protocols, quality metrics, and best practice recommendations. A written personalized care plan for preventive services as well as  general preventive health recommendations were provided to patient.     Tillie Rung, LPN   07/28/8117   After Visit Summary: (In Person-Printed) AVS printed and given to the patient  Nurse Notes: None

## 2023-06-15 NOTE — Patient Instructions (Addendum)
 Mr. [name] [name] , Thank you for taking time to come for your Medicare Wellness Visit. I appreciate your ongoing commitment to your health goals. Please review the following plan we discussed and let me know if I can assist you in the future.   Referrals/Orders/Follow-Ups/Clinician Recommendations:   This is a list of the screening recommended for you and due dates:  Health Maintenance  Topic Date Due   COVID-19 Vaccine (7 - 2024-25 season) 12/21/2022   Hepatitis C Screening  10/30/2023*   Eye exam for diabetics  07/22/2023   Yearly kidney health urinalysis for diabetes  10/30/2023   Hemoglobin A1C  11/01/2023   Yearly kidney function blood test for diabetes  02/04/2024   Complete foot exam   02/04/2024   Medicare Annual Wellness Visit  06/14/2024   DTaP/Tdap/Td vaccine (2 - Td or Tdap) 11/30/2029   Pneumonia Vaccine  Completed   Flu Shot  Completed   Zoster (Shingles) Vaccine  Completed   HPV Vaccine  Aged Out   Colon Cancer Screening  Discontinued  *Topic was postponed. The date shown is not the original due date.    Advanced directives: (Copy Requested) Please bring a copy of your health care power of attorney and living will to the office to be added to your chart at your convenience.  Next Medicare Annual Wellness Visit scheduled for next year: Yes

## 2023-06-27 ENCOUNTER — Other Ambulatory Visit: Payer: Self-pay | Admitting: Family Medicine

## 2023-06-27 DIAGNOSIS — E118 Type 2 diabetes mellitus with unspecified complications: Secondary | ICD-10-CM

## 2023-06-29 ENCOUNTER — Other Ambulatory Visit (HOSPITAL_COMMUNITY): Payer: Self-pay

## 2023-06-29 MED ORDER — METFORMIN HCL ER 500 MG PO TB24
1000.0000 mg | ORAL_TABLET | Freq: Every day | ORAL | 1 refills | Status: DC
  Filled 2023-06-29 – 2023-09-28 (×2): qty 180, 90d supply, fill #0

## 2023-07-17 ENCOUNTER — Other Ambulatory Visit (HOSPITAL_BASED_OUTPATIENT_CLINIC_OR_DEPARTMENT_OTHER): Payer: Self-pay

## 2023-07-23 ENCOUNTER — Other Ambulatory Visit (HOSPITAL_BASED_OUTPATIENT_CLINIC_OR_DEPARTMENT_OTHER): Payer: Self-pay

## 2023-07-23 ENCOUNTER — Other Ambulatory Visit: Payer: Self-pay

## 2023-07-23 ENCOUNTER — Other Ambulatory Visit (HOSPITAL_COMMUNITY): Payer: Self-pay

## 2023-07-23 ENCOUNTER — Other Ambulatory Visit: Payer: Self-pay | Admitting: Family Medicine

## 2023-07-23 DIAGNOSIS — I10 Essential (primary) hypertension: Secondary | ICD-10-CM

## 2023-07-23 MED ORDER — IRBESARTAN-HYDROCHLOROTHIAZIDE 300-12.5 MG PO TABS
1.0000 | ORAL_TABLET | Freq: Every day | ORAL | 1 refills | Status: DC
  Filled 2023-07-23: qty 90, 90d supply, fill #0

## 2023-07-24 ENCOUNTER — Other Ambulatory Visit: Payer: Self-pay | Admitting: Family Medicine

## 2023-07-24 ENCOUNTER — Other Ambulatory Visit (HOSPITAL_BASED_OUTPATIENT_CLINIC_OR_DEPARTMENT_OTHER): Payer: Self-pay

## 2023-07-24 DIAGNOSIS — I10 Essential (primary) hypertension: Secondary | ICD-10-CM

## 2023-07-24 DIAGNOSIS — E118 Type 2 diabetes mellitus with unspecified complications: Secondary | ICD-10-CM

## 2023-07-24 MED ORDER — EMPAGLIFLOZIN 25 MG PO TABS
25.0000 mg | ORAL_TABLET | Freq: Every day | ORAL | 1 refills | Status: DC
Start: 2023-07-24 — End: 2024-02-03
  Filled 2023-07-24: qty 90, 90d supply, fill #0
  Filled 2023-11-05: qty 90, 90d supply, fill #1

## 2023-07-24 MED ORDER — IRBESARTAN-HYDROCHLOROTHIAZIDE 300-12.5 MG PO TABS
1.0000 | ORAL_TABLET | Freq: Every day | ORAL | 1 refills | Status: DC
  Filled 2023-07-24 – 2023-10-24 (×2): qty 90, 90d supply, fill #0
  Filled 2024-01-27: qty 90, 90d supply, fill #1

## 2023-07-24 NOTE — Telephone Encounter (Signed)
 Rx done.

## 2023-07-27 DIAGNOSIS — H35371 Puckering of macula, right eye: Secondary | ICD-10-CM | POA: Diagnosis not present

## 2023-07-27 DIAGNOSIS — H43813 Vitreous degeneration, bilateral: Secondary | ICD-10-CM | POA: Diagnosis not present

## 2023-07-27 DIAGNOSIS — H02831 Dermatochalasis of right upper eyelid: Secondary | ICD-10-CM | POA: Diagnosis not present

## 2023-07-27 DIAGNOSIS — H25813 Combined forms of age-related cataract, bilateral: Secondary | ICD-10-CM | POA: Diagnosis not present

## 2023-07-27 DIAGNOSIS — E119 Type 2 diabetes mellitus without complications: Secondary | ICD-10-CM | POA: Diagnosis not present

## 2023-07-27 DIAGNOSIS — H02834 Dermatochalasis of left upper eyelid: Secondary | ICD-10-CM | POA: Diagnosis not present

## 2023-07-27 LAB — HM DIABETES EYE EXAM

## 2023-09-09 ENCOUNTER — Encounter: Payer: Self-pay | Admitting: Cardiology

## 2023-09-09 DIAGNOSIS — N1832 Chronic kidney disease, stage 3b: Secondary | ICD-10-CM | POA: Diagnosis not present

## 2023-09-16 DIAGNOSIS — D631 Anemia in chronic kidney disease: Secondary | ICD-10-CM | POA: Diagnosis not present

## 2023-09-16 DIAGNOSIS — N2581 Secondary hyperparathyroidism of renal origin: Secondary | ICD-10-CM | POA: Diagnosis not present

## 2023-09-16 DIAGNOSIS — N189 Chronic kidney disease, unspecified: Secondary | ICD-10-CM | POA: Diagnosis not present

## 2023-09-16 DIAGNOSIS — N1832 Chronic kidney disease, stage 3b: Secondary | ICD-10-CM | POA: Diagnosis not present

## 2023-09-16 DIAGNOSIS — I129 Hypertensive chronic kidney disease with stage 1 through stage 4 chronic kidney disease, or unspecified chronic kidney disease: Secondary | ICD-10-CM | POA: Diagnosis not present

## 2023-09-23 DIAGNOSIS — L814 Other melanin hyperpigmentation: Secondary | ICD-10-CM | POA: Diagnosis not present

## 2023-09-23 DIAGNOSIS — L570 Actinic keratosis: Secondary | ICD-10-CM | POA: Diagnosis not present

## 2023-09-23 DIAGNOSIS — L821 Other seborrheic keratosis: Secondary | ICD-10-CM | POA: Diagnosis not present

## 2023-09-23 DIAGNOSIS — L578 Other skin changes due to chronic exposure to nonionizing radiation: Secondary | ICD-10-CM | POA: Diagnosis not present

## 2023-09-23 DIAGNOSIS — D225 Melanocytic nevi of trunk: Secondary | ICD-10-CM | POA: Diagnosis not present

## 2023-10-24 ENCOUNTER — Other Ambulatory Visit: Payer: Self-pay | Admitting: Family Medicine

## 2023-10-24 ENCOUNTER — Other Ambulatory Visit (HOSPITAL_BASED_OUTPATIENT_CLINIC_OR_DEPARTMENT_OTHER): Payer: Self-pay

## 2023-10-24 DIAGNOSIS — I10 Essential (primary) hypertension: Secondary | ICD-10-CM

## 2023-10-26 ENCOUNTER — Other Ambulatory Visit (HOSPITAL_BASED_OUTPATIENT_CLINIC_OR_DEPARTMENT_OTHER): Payer: Self-pay

## 2023-10-26 ENCOUNTER — Other Ambulatory Visit: Payer: Self-pay

## 2023-10-26 MED ORDER — AMLODIPINE BESYLATE 5 MG PO TABS
5.0000 mg | ORAL_TABLET | Freq: Every day | ORAL | 1 refills | Status: DC
  Filled 2023-10-26: qty 90, 90d supply, fill #0
  Filled 2024-01-27: qty 90, 90d supply, fill #1

## 2023-11-05 ENCOUNTER — Other Ambulatory Visit: Payer: Self-pay

## 2023-11-05 ENCOUNTER — Ambulatory Visit: Payer: PPO | Admitting: Family Medicine

## 2023-11-05 ENCOUNTER — Encounter: Payer: Self-pay | Admitting: Family Medicine

## 2023-11-05 ENCOUNTER — Other Ambulatory Visit (HOSPITAL_BASED_OUTPATIENT_CLINIC_OR_DEPARTMENT_OTHER): Payer: Self-pay

## 2023-11-05 VITALS — BP 116/60 | HR 50 | Temp 98.4°F | Ht 67.0 in | Wt 177.1 lb

## 2023-11-05 DIAGNOSIS — Z0000 Encounter for general adult medical examination without abnormal findings: Secondary | ICD-10-CM

## 2023-11-05 DIAGNOSIS — E7849 Other hyperlipidemia: Secondary | ICD-10-CM

## 2023-11-05 DIAGNOSIS — E118 Type 2 diabetes mellitus with unspecified complications: Secondary | ICD-10-CM

## 2023-11-05 DIAGNOSIS — I480 Paroxysmal atrial fibrillation: Secondary | ICD-10-CM | POA: Diagnosis not present

## 2023-11-05 DIAGNOSIS — I499 Cardiac arrhythmia, unspecified: Secondary | ICD-10-CM | POA: Diagnosis not present

## 2023-11-05 LAB — POCT GLYCOSYLATED HEMOGLOBIN (HGB A1C): Hemoglobin A1C: 7.4 % — AB (ref 4.0–5.6)

## 2023-11-05 LAB — MICROALBUMIN / CREATININE URINE RATIO
Creatinine,U: 73.6 mg/dL
Microalb Creat Ratio: 35.9 mg/g — ABNORMAL HIGH (ref 0.0–30.0)
Microalb, Ur: 2.6 mg/dL — ABNORMAL HIGH (ref 0.0–1.9)

## 2023-11-05 MED ORDER — ROSUVASTATIN CALCIUM 20 MG PO TABS
20.0000 mg | ORAL_TABLET | Freq: Every day | ORAL | 3 refills | Status: AC
  Filled 2023-11-05 – 2023-12-02 (×3): qty 90, 90d supply, fill #0
  Filled 2024-03-06: qty 90, 90d supply, fill #1

## 2023-11-05 NOTE — Progress Notes (Signed)
 Complete physical exam  Patient: [name] [name]   DOB: 03/08/1946   78 y.o. Male  MRN: [id number]  Subjective:    Chief Complaint  Patient presents with   Annual Exam    [name] is a 78 y.o. male who presents today for a complete physical exam. He reports consuming a general diet, take vitamin B and D supplements as well as a multivitamin and omega -3.  Plays patonk once a week at the park, does daily yard work outside, [employer] the yard regularly. He generally feels well. He reports sleeping fairly well, states he sometimes has to get up at night, no problems falling asleep, gets at least 7 hours at night. He does not have additional problems to discuss today.   As I was performing the physical exam I noticed an irregularly irregular rhythm. Patient denies any SOB, no heart palpitations or dizziness, no chest pain or exercise intolerance.   EKG interpretation note:  Compared to previous EKG from May 2024. Patient is in atrial fibrillation today, left bundle branch block is also present which is chronic, appears similar in morphology from previous EKGS. HR of 102 today.   CHADS/VASC score is 5. I counseled the patient that he should make an appointment with his cardiologist and I sent a message explaining the results of the EKG and asked for their office to call the patient to schedule.    Most recent fall risk assessment:    06/15/2023   11:38 AM  Fall Risk   Falls in the past year? 0  Number falls in past yr: 0  Injury with Fall? 0  Risk for fall due to : No Fall Risks  Follow up Falls prevention discussed;Falls evaluation completed     Most recent depression screenings:    11/05/2023    8:59 AM 06/15/2023   11:26 AM  PHQ 2/9 Scores  PHQ - 2 Score 0 0  PHQ- 9 Score 0     Vision:Within last year and Dental: No current dental problems and Receives regular dental care  Patient Active Problem List   Diagnosis Date Noted   CKD stage 3b, GFR 30-44 ml/min (HCC) 02/04/2023    Encounter for therapeutic drug monitoring 08/20/2015   Coronary artery disease involving coronary bypass graft of native heart with angina pectoris (HCC) 08/13/2015   Diabetes mellitus type 2 with complications (HCC) 08/03/2015   Hyperlipidemia 08/03/2015   Essential hypertension 08/03/2015   Atrial fibrillation (HCC)    Tachycardia       Patient Care Team: Ozell Heron HERO, MD as PCP - General (Family Medicine) Kate Lonni CROME, MD as PCP - Cardiology (Cardiology) Parthenia Olivia HERO, PA-C as Physician Assistant (Cardiology)   Outpatient Medications Prior to Visit  Medication Sig   amLODipine  (NORVASC ) 5 MG tablet Take 1 tablet (5 mg total) by mouth daily.   aspirin  81 MG tablet Take 81 mg by mouth daily.   Cholecalciferol (VITAMIN D) 2000 units tablet Take 2,000 Units by mouth 3 (three) times a week.    Cyanocobalamin (B-12) 2500 MCG TABS Take 1 tablet by mouth 3 (three) times a week.   empagliflozin  (JARDIANCE ) 25 MG TABS tablet Take 1 tablet (25 mg total) by mouth daily before breakfast.   irbesartan -hydrochlorothiazide  (AVALIDE) 300-12.5 MG tablet Take 1 tablet by mouth daily.   metFORMIN  (GLUCOPHAGE -XR) 500 MG 24 hr tablet Take 2 tablets (1,000 mg total) by mouth daily.   metoprolol  tartrate (LOPRESSOR ) 25 MG tablet Take 1.5 tablets (  37.5 mg total) by mouth 2 (two) times daily.   [DISCONTINUED] rosuvastatin  (CRESTOR ) 20 MG tablet Take 1 tablet (20 mg total) by mouth daily.   No facility-administered medications prior to visit.    Review of Systems  HENT:  Negative for hearing loss.   Eyes:  Negative for blurred vision.  Respiratory:  Negative for shortness of breath.   Cardiovascular:  Negative for chest pain.  Gastrointestinal: Negative.   Genitourinary: Negative.   Musculoskeletal:  Negative for back pain.  Neurological:  Negative for headaches.  Psychiatric/Behavioral:  Negative for depression.        Objective:     BP 116/60   Pulse (!) 50   Temp  98.4 F (36.9 C) (Oral)   Ht 5' 7 (1.702 m)   Wt 177 lb 1.6 oz (80.3 kg)   SpO2 96%   BMI 27.74 kg/m    Physical Exam Vitals reviewed.  Constitutional:      Appearance: Normal appearance. He is well-groomed and normal weight.  HENT:     Right Ear: Tympanic membrane and ear canal normal.     Left Ear: Tympanic membrane and ear canal normal. There is impacted cerumen.     Mouth/Throat:     Mouth: Mucous membranes are moist.     Pharynx: No posterior oropharyngeal erythema.  Eyes:     Extraocular Movements: Extraocular movements intact.     Conjunctiva/sclera: Conjunctivae normal.  Neck:     Thyroid : No thyromegaly.  Cardiovascular:     Rate and Rhythm: Normal rate. Rhythm irregular.     Heart sounds: S1 normal and S2 normal. No murmur heard. Pulmonary:     Effort: Pulmonary effort is normal.     Breath sounds: Normal breath sounds and air entry. No rales.  Abdominal:     General: Abdomen is flat. Bowel sounds are normal.  Musculoskeletal:     Right lower leg: No edema.     Left lower leg: No edema.  Lymphadenopathy:     Cervical: No cervical adenopathy.  Neurological:     General: No focal deficit present.     Mental Status: He is alert and oriented to person, place, and time.     Gait: Gait is intact.  Psychiatric:        Mood and Affect: Mood and affect normal.      Results for orders placed or performed in visit on 11/05/23  HM DIABETES EYE EXAM  Result Value Ref Range   HM Diabetic Eye Exam No Retinopathy No Retinopathy  Results for orders placed or performed in visit on 11/05/23  Microalbumin / creatinine urine ratio  Result Value Ref Range   Microalb, Ur 2.6 (H) 0.0 - 1.9 mg/dL   Creatinine,U 26.3 mg/dL   Microalb Creat Ratio 35.9 (H) 0.0 - 30.0 mg/g  POC HgB A1c  Result Value Ref Range   Hemoglobin A1C 7.4 (A) 4.0 - 5.6 %   HbA1c POC (<> result, manual entry)     HbA1c, POC (prediabetic range)     HbA1c, POC (controlled diabetic range)          Assessment & Plan:    Routine Health Maintenance and Physical Exam  Immunization History  Administered Date(s) Administered   Fluad Quad(high Dose 65+) 01/09/2021, 02/05/2022   Fluad Trivalent(High Dose 65+) 01/23/2023   Influenza,inj,Quad PF,6+ Mos 01/25/2016, 02/05/2017, 01/22/2018, 01/21/2019, 01/24/2020   Influenza,inj,quad, With Preservative 02/05/2015   Moderna SARS-COV2 Booster Vaccination 02/24/2020, 09/25/2020   Moderna Sars-Covid-2 Vaccination 06/03/2019,  07/01/2019   PFIZER(Purple Top)SARS-COV-2 Vaccination 02/04/2021   Pfizer(Comirnaty )Fall Seasonal Vaccine 12 years and older 02/05/2022   Pneumococcal Conjugate-13 02/22/2014   Pneumococcal Polysaccharide-23 01/19/2013   Tdap 12/01/2019   Zoster Recombinant(Shingrix ) 12/18/2020, 05/27/2021   Zoster, Live 02/20/2012    Health Maintenance  Topic Date Due   COVID-19 Vaccine (7 - 2024-25 season) 12/21/2022   Hepatitis C Screening  11/04/2024 (Originally 10/12/1963)   INFLUENZA VACCINE  11/20/2023   Diabetic kidney evaluation - eGFR measurement  02/04/2024   FOOT EXAM  02/04/2024   HEMOGLOBIN A1C  05/07/2024   Medicare Annual Wellness (AWV)  06/14/2024   OPHTHALMOLOGY EXAM  07/26/2024   Diabetic kidney evaluation - Urine ACR  11/04/2024   DTaP/Tdap/Td (2 - Td or Tdap) 11/30/2029   Pneumococcal Vaccine: 50+ Years  Completed   Zoster Vaccines- Shingrix   Completed   Hepatitis B Vaccines  Aged Out   HPV VACCINES  Aged Out   Meningococcal B Vaccine  Aged Out   Colonoscopy  Discontinued    Discussed health benefits of physical activity, and encouraged him to engage in regular exercise appropriate for his age and condition.  Diabetes mellitus type 2 with complications (HCC) -     POCT glycosylated hemoglobin (Hb A1C) -     Collection capillary blood specimen -     Microalbumin / creatinine urine ratio; Future  Other hyperlipidemia -     Rosuvastatin  Calcium ; Take 1 tablet (20 mg total) by mouth daily.  Dispense: 90  tablet; Refill: 3  Routine general medical examination at a health care facility  Irregular cardiac rhythm -     EKG 12-Lead  Paroxysmal atrial fibrillation [facility])  Patient had episode of atrial fibrillation after his cardiac bypass surgery, however EKGS since then have shown NSR. This is a new finding today for him and he is currently asymptomatic. I advised him to make an appt with his cardiologist to discuss this and also to discuss starting NOAC to reduce his risk of stroke.   Normal physical exam findings except for atrial fibrillation on EKG. I counseled the patient on the recommended amount of exercise per CDC recommendation. I reviewed preventative screening, immunizations, and medical history and updated in the chart, and appropriate labs and vaccinations were ordered. Handouts given on healthy eating and exercise.    Return in about 6 months (around 05/07/2024) for DM, HTN.     Heron CHRISTELLA Sharper, MD

## 2023-11-05 NOTE — Patient Instructions (Signed)
 Health Maintenance, Male  Adopting a healthy lifestyle and getting preventive care are important in promoting health and wellness. Ask your health care provider about:  The right schedule for you to have regular tests and exams.  Things you can do on your own to prevent diseases and keep yourself healthy.  What should I know about diet, weight, and exercise?  Eat a healthy diet    Eat a diet that includes plenty of vegetables, fruits, low-fat dairy products, and lean protein.  Do not eat a lot of foods that are high in solid fats, added sugars, or sodium.  Maintain a healthy weight  Body mass index (BMI) is a measurement that can be used to identify possible weight problems. It estimates body fat based on height and weight. Your health care provider can help determine your BMI and help you achieve or maintain a healthy weight.  Get regular exercise  Get regular exercise. This is one of the most important things you can do for your health. Most adults should:  Exercise for at least 150 minutes each week. The exercise should increase your heart rate and make you sweat (moderate-intensity exercise).  Do strengthening exercises at least twice a week. This is in addition to the moderate-intensity exercise.  Spend less time sitting. Even light physical activity can be beneficial.  Watch cholesterol and blood lipids  Have your blood tested for lipids and cholesterol at 77 years of age, then have this test every 5 years.  You may need to have your cholesterol levels checked more often if:  Your lipid or cholesterol levels are high.  You are older than 77 years of age.  You are at high risk for heart disease.  What should I know about cancer screening?  Many types of cancers can be detected early and may often be prevented. Depending on your health history and family history, you may need to have cancer screening at various ages. This may include screening for:  Colorectal cancer.  Prostate cancer.  Skin cancer.  Lung  cancer.  What should I know about heart disease, diabetes, and high blood pressure?  Blood pressure and heart disease  High blood pressure causes heart disease and increases the risk of stroke. This is more likely to develop in people who have high blood pressure readings or are overweight.  Talk with your health care provider about your target blood pressure readings.  Have your blood pressure checked:  Every 3-5 years if you are 77-77 years of age.  Every year if you are 85 years old or older.  If you are between the ages of 29 and 29 and are a current or former smoker, ask your health care provider if you should have a one-time screening for abdominal aortic aneurysm (AAA).  Diabetes  Have regular diabetes screenings. This checks your fasting blood sugar level. Have the screening done:  Once every three years after age 77 if you are at a normal weight and have a low risk for diabetes.  More often and at a younger age if you are overweight or have a high risk for diabetes.  What should I know about preventing infection?  Hepatitis B  If you have a higher risk for hepatitis B, you should be screened for this virus. Talk with your health care provider to find out if you are at risk for hepatitis B infection.  Hepatitis C  Blood testing is recommended for:  Everyone born from [birth year] through 1964.  Anyone  with known risk factors for hepatitis C.  Sexually transmitted infections (STIs)  You should be screened each year for STIs, including gonorrhea and chlamydia, if:  You are sexually active and are younger than 77 years of age.  You are older than 77 years of age and your health care provider tells you that you are at risk for this type of infection.  Your sexual activity has changed since you were last screened, and you are at increased risk for chlamydia or gonorrhea. Ask your health care provider if you are at risk.  Ask your health care provider about whether you are at high risk for HIV. Your health care provider  may recommend a prescription medicine to help prevent HIV infection. If you choose to take medicine to prevent HIV, you should first get tested for HIV. You should then be tested every 3 months for as long as you are taking the medicine.  Follow these instructions at home:  Alcohol use  Do not drink alcohol if your health care provider tells you not to drink.  If you drink alcohol:  Limit how much you have to 0-2 drinks a day.  Know how much alcohol is in your drink. In the [place] one drink equals one 12 oz bottle of beer (355 mL), one 5 oz glass of wine (148 mL), or one 1 oz glass of hard liquor (44 mL).  Lifestyle  Do not use any products that contain nicotine or tobacco. These products include cigarettes, chewing tobacco, and vaping devices, such as e-cigarettes. If you need help quitting, ask your health care provider.  Do not use street drugs.  Do not share needles.  Ask your health care provider for help if you need support or information about quitting drugs.  General instructions  Schedule regular health, dental, and eye exams.  Stay current with your vaccines.  Tell your health care provider if:  You often feel depressed.  You have ever been abused or do not feel safe at home.  Summary  Adopting a healthy lifestyle and getting preventive care are important in promoting health and wellness.  Follow your health care provider's instructions about healthy diet, exercising, and getting tested or screened for diseases.  Follow your health care provider's instructions on monitoring your cholesterol and blood pressure.  This information is not intended to replace advice given to you by your health care provider. Make sure you discuss any questions you have with your health care provider.  Document Revised: 08/27/2020 Document Reviewed: 08/27/2020  Elsevier Patient Education  2024 [employer].

## 2023-11-09 ENCOUNTER — Ambulatory Visit: Payer: Self-pay | Admitting: Family Medicine

## 2023-11-20 ENCOUNTER — Other Ambulatory Visit: Payer: Self-pay | Admitting: Family Medicine

## 2023-11-20 ENCOUNTER — Encounter: Payer: Self-pay | Admitting: Family Medicine

## 2023-11-20 ENCOUNTER — Other Ambulatory Visit (HOSPITAL_BASED_OUTPATIENT_CLINIC_OR_DEPARTMENT_OTHER): Payer: Self-pay

## 2023-11-20 DIAGNOSIS — I480 Paroxysmal atrial fibrillation: Secondary | ICD-10-CM

## 2023-11-20 MED ORDER — APIXABAN 5 MG PO TABS
5.0000 mg | ORAL_TABLET | Freq: Two times a day (BID) | ORAL | 5 refills | Status: DC
  Filled 2023-11-20: qty 60, 30d supply, fill #0
  Filled 2023-12-15: qty 60, 30d supply, fill #1
  Filled 2024-01-14: qty 60, 30d supply, fill #2
  Filled 2024-02-10: qty 60, 30d supply, fill #3
  Filled 2024-03-14: qty 60, 30d supply, fill #4
  Filled 2024-04-19: qty 60, 30d supply, fill #5

## 2023-11-24 ENCOUNTER — Inpatient Hospital Stay (HOSPITAL_COMMUNITY)
Admission: RE | Admit: 2023-11-24 | Discharge: 2023-11-24 | Disposition: A | Discharge status: Home / Self Care | Source: Ambulatory Visit | Attending: Internal Medicine | Admitting: Internal Medicine

## 2023-11-24 ENCOUNTER — Ambulatory Visit (HOSPITAL_COMMUNITY): Admitting: Internal Medicine

## 2023-11-24 ENCOUNTER — Other Ambulatory Visit (HOSPITAL_COMMUNITY): Payer: Self-pay

## 2023-11-24 ENCOUNTER — Ambulatory Visit (HOSPITAL_COMMUNITY)
Admission: RE | Admit: 2023-11-24 | Discharge: 2023-11-24 | Disposition: A | Discharge status: Home / Self Care | Source: Ambulatory Visit | Attending: Internal Medicine | Admitting: Internal Medicine

## 2023-11-24 VITALS — BP 138/62 | HR 63 | Ht 67.0 in | Wt 172.8 lb

## 2023-11-24 DIAGNOSIS — I480 Paroxysmal atrial fibrillation: Secondary | ICD-10-CM | POA: Diagnosis not present

## 2023-11-24 DIAGNOSIS — D6869 Other thrombophilia: Secondary | ICD-10-CM

## 2023-11-24 DIAGNOSIS — I4891 Unspecified atrial fibrillation: Secondary | ICD-10-CM

## 2023-11-24 NOTE — Patient Instructions (Signed)
 Echocardiogram / Sleep -- scheduling will call once insurance authorization received.   Wear monitor for 14 days and send in the mail

## 2023-11-24 NOTE — Progress Notes (Signed)
 Primary Care Physician: Ozell Heron HERO, MD Primary Cardiologist: Lonni LITTIE Nanas, MD Electrophysiologist: None     Referring Physician: Ozell Heron HERO, MD     [name] [name] is a 78 y.o. male with a history of HTN, CAD, CKD, T2DM, HLD, and atrial fibrillation who presents for consultation in the [facility]. Annual physical exam on 11/05/23 noted to be in new Afib. Patient is on Eliquis  5 mg BID for a CHADS2VASC score of 5.  On evaluation today, patient is currently in NSR. He did not have cardiac awareness when notified of Afib during annual physical. Patient does not drink alcohol. He drinks 2 cups of coffee daily. He may snore. Former patient of Dr. Claudene.  Today, he denies symptoms of palpitations, chest pain, shortness of breath, orthopnea, PND, lower extremity edema, dizziness, presyncope, syncope, snoring, daytime somnolence, bleeding, or neurologic sequela. The patient is tolerating medications without difficulties and is otherwise without complaint today.    Atrial Fibrillation Risk Factors:  he does have symptoms or diagnosis of sleep apnea.   he has a BMI of Body mass index is 27.06 kg/m.SABRA Filed Weights   11/24/23 1348  Weight: 78.4 kg    Current Outpatient Medications  Medication Sig Dispense Refill   amLODipine  (NORVASC ) 5 MG tablet Take 1 tablet (5 mg total) by mouth daily. 90 tablet 1   apixaban  (ELIQUIS ) 5 MG TABS tablet Take 1 tablet (5 mg total) by mouth 2 (two) times daily. 60 tablet 5   Aspirin  81 MG CAPS Take 1 tablet by mouth every morning.     Cholecalciferol (VITAMIN D) 2000 units tablet Take 2,000 Units by mouth 3 (three) times a week.      Cyanocobalamin (B-12) 2500 MCG TABS Take 1 tablet by mouth 3 (three) times a week.     empagliflozin  (JARDIANCE ) 25 MG TABS tablet Take 1 tablet (25 mg total) by mouth daily before breakfast. 90 tablet 1   irbesartan -hydrochlorothiazide  (AVALIDE) 300-12.5 MG tablet Take 1  tablet by mouth daily. 90 tablet 1   metFORMIN  (GLUCOPHAGE -XR) 500 MG 24 hr tablet Take 2 tablets (1,000 mg total) by mouth daily. 180 tablet 1   metoprolol  tartrate (LOPRESSOR ) 25 MG tablet Take 1.5 tablets (37.5 mg total) by mouth 2 (two) times daily. 270 tablet 3   Multiple Vitamin (MULTIVITAMIN ADULT PO) Take 1 tablet by mouth every morning.     rosuvastatin  (CRESTOR ) 20 MG tablet Take 1 tablet (20 mg total) by mouth daily. 90 tablet 3   No current facility-administered medications for this encounter.    Atrial Fibrillation Management history:  Previous antiarrhythmic drugs: none Previous cardioversions: none Previous ablations: none Anticoagulation history: Eliquis    ROS- All systems are reviewed and negative except as per the HPI above.  Physical Exam: BP 138/62   Pulse 63   Ht 5' 7 (1.702 m)   Wt 78.4 kg   BMI 27.06 kg/m   GEN: Well nourished, well developed in no acute distress NECK: No JVD; No carotid bruits CARDIAC: Regular rate and rhythm, no murmurs, rubs, gallops RESPIRATORY:  Clear to auscultation without rales, wheezing or rhonchi  ABDOMEN: Soft, non-tender, non-distended EXTREMITIES:  No edema; No deformity   EKG today demonstrates  Vent. rate 63 BPM PR interval 160 ms QRS duration 150 ms QT/QTcB 438/448 ms P-R-T axes -26 34 117 Normal sinus rhythm Left bundle branch block Abnormal ECG When compared with ECG of 17-Aug-2015 05:52, No significant change was found  Echo 08/01/2015 demonstrated  Study Conclusions   - Left ventricle: The cavity size was normal. Wall thickness was    normal. Systolic function was normal. The estimated ejection    fraction was in the range of 60% to 65%. Wall motion was normal;    there were no regional wall motion abnormalities. Doppler    parameters are consistent with abnormal left ventricular    relaxation (grade 1 diastolic dysfunction).   ASSESSMENT & PLAN CHA2DS2-VASc Score = 5  The patient's score is based  upon: CHF History: 0 HTN History: 1 Diabetes History: 1 Stroke History: 0 Vascular Disease History: 1 Age Score: 2 Gender Score: 0       ASSESSMENT AND PLAN: Paroxysmal Atrial Fibrillation (ICD10:  I48.0) The patient's CHA2DS2-VASc score is 5, indicating a 7.2% annual risk of stroke.    He is currently in NSR. Education provided about Afib. Discussion about triggers for Afib and also medication treatments and ablation going forward if indicated. After discussion, we will place cardiac monitor to assess PAF burden. Will help patient establish with primary cardiologist - wife prefers Dr. Raford due to proximity to home. Will update echocardiogram. Will refer for sleep study. Rhythm monitoring device recommended.   Secondary Hypercoagulable State (ICD10:  D68.69) The patient is at significant risk for stroke/thromboembolism based upon his CHA2DS2-VASc Score of 5.  Continue Apixaban  (Eliquis ).     Follow up will be based on monitor results.    Terra Pac, PA-C  Afib Clinic [facility] [address] [place], [place] [address] [phone]

## 2023-11-25 ENCOUNTER — Other Ambulatory Visit (HOSPITAL_COMMUNITY): Payer: Self-pay | Admitting: *Deleted

## 2023-11-25 ENCOUNTER — Telehealth (HOSPITAL_COMMUNITY): Payer: Self-pay | Admitting: *Deleted

## 2023-11-25 DIAGNOSIS — I480 Paroxysmal atrial fibrillation: Secondary | ICD-10-CM

## 2023-11-25 MED ORDER — METOPROLOL TARTRATE 25 MG PO TABS: 25.0000 mg | ORAL_TABLET | Freq: Two times a day (BID) | ORAL | Status: DC

## 2023-11-25 NOTE — Telephone Encounter (Signed)
 Patient wife called back into clinic after his appointment today after she realized patient is only take metoprolol  25mg  BID instead of as prescribed at 37.5mg  BID. Pt wife states he has been doing this dose for many months as she had not paid attention to the instructions on the bottle. Discussed with Thom Heinrich PA will keep at current dose of metoprolol  25mg  BID as patient was rate controlled at office visit yesterday. Patient wife notified of recommendations.

## 2023-12-02 ENCOUNTER — Other Ambulatory Visit (HOSPITAL_BASED_OUTPATIENT_CLINIC_OR_DEPARTMENT_OTHER): Payer: Self-pay

## 2023-12-18 ENCOUNTER — Ambulatory Visit (HOSPITAL_COMMUNITY)
Admission: RE | Admit: 2023-12-18 | Discharge: 2023-12-18 | Disposition: A | Discharge status: Home / Self Care | Source: Ambulatory Visit | Attending: Cardiology | Admitting: Cardiology

## 2023-12-18 DIAGNOSIS — I480 Paroxysmal atrial fibrillation: Secondary | ICD-10-CM | POA: Diagnosis not present

## 2023-12-18 DIAGNOSIS — D6869 Other thrombophilia: Secondary | ICD-10-CM | POA: Diagnosis not present

## 2023-12-18 LAB — ECHOCARDIOGRAM COMPLETE
Area-P 1/2: 2.55 cm2
S' Lateral: 2.3 cm

## 2023-12-22 ENCOUNTER — Ambulatory Visit (HOSPITAL_COMMUNITY): Payer: Self-pay | Admitting: Internal Medicine

## 2023-12-22 DIAGNOSIS — I4891 Unspecified atrial fibrillation: Secondary | ICD-10-CM | POA: Diagnosis not present

## 2023-12-22 NOTE — Addendum Note (Signed)
 Encounter addended by: Franchot Glade RAMAN, RN on: 12/22/2023 2:24 PM  Actions taken: Imaging Exam ended

## 2023-12-22 NOTE — Addendum Note (Signed)
 Encounter addended by: Janel Nancy SAUNDERS, RN on: 12/22/2023 2:21 PM  Actions taken: Imaging Exam ended

## 2023-12-23 ENCOUNTER — Other Ambulatory Visit: Payer: Self-pay | Admitting: Physician Assistant

## 2023-12-23 ENCOUNTER — Other Ambulatory Visit (HOSPITAL_BASED_OUTPATIENT_CLINIC_OR_DEPARTMENT_OTHER): Payer: Self-pay

## 2023-12-23 DIAGNOSIS — I25709 Atherosclerosis of coronary artery bypass graft(s), unspecified, with unspecified angina pectoris: Secondary | ICD-10-CM | POA: Diagnosis not present

## 2023-12-23 DIAGNOSIS — E118 Type 2 diabetes mellitus with unspecified complications: Secondary | ICD-10-CM | POA: Diagnosis not present

## 2023-12-23 DIAGNOSIS — I10 Essential (primary) hypertension: Secondary | ICD-10-CM | POA: Diagnosis not present

## 2023-12-23 DIAGNOSIS — G4719 Other hypersomnia: Secondary | ICD-10-CM | POA: Diagnosis not present

## 2023-12-23 DIAGNOSIS — G4721 Circadian rhythm sleep disorder, delayed sleep phase type: Secondary | ICD-10-CM | POA: Diagnosis not present

## 2023-12-23 DIAGNOSIS — I4891 Unspecified atrial fibrillation: Secondary | ICD-10-CM | POA: Diagnosis not present

## 2023-12-23 DIAGNOSIS — I480 Paroxysmal atrial fibrillation: Secondary | ICD-10-CM

## 2023-12-24 ENCOUNTER — Other Ambulatory Visit (HOSPITAL_BASED_OUTPATIENT_CLINIC_OR_DEPARTMENT_OTHER): Payer: Self-pay

## 2023-12-24 ENCOUNTER — Ambulatory Visit (HOSPITAL_COMMUNITY): Payer: Self-pay | Admitting: Internal Medicine

## 2023-12-24 MED ORDER — METOPROLOL TARTRATE 25 MG PO TABS
25.0000 mg | ORAL_TABLET | Freq: Two times a day (BID) | ORAL | 2 refills | Status: AC
  Filled 2023-12-24: qty 180, 90d supply, fill #0
  Filled 2024-03-21: qty 180, 90d supply, fill #1

## 2023-12-24 NOTE — Telephone Encounter (Signed)
 This is a A-Fib pt. Please address

## 2024-01-06 ENCOUNTER — Other Ambulatory Visit: Payer: Self-pay | Admitting: Family Medicine

## 2024-01-06 DIAGNOSIS — E118 Type 2 diabetes mellitus with unspecified complications: Secondary | ICD-10-CM

## 2024-01-07 DIAGNOSIS — G4736 Sleep related hypoventilation in conditions classified elsewhere: Secondary | ICD-10-CM | POA: Diagnosis not present

## 2024-01-07 DIAGNOSIS — R0683 Snoring: Secondary | ICD-10-CM | POA: Diagnosis not present

## 2024-01-08 ENCOUNTER — Other Ambulatory Visit (HOSPITAL_BASED_OUTPATIENT_CLINIC_OR_DEPARTMENT_OTHER): Payer: Self-pay

## 2024-01-08 MED ORDER — METFORMIN HCL ER 500 MG PO TB24
1000.0000 mg | ORAL_TABLET | Freq: Every day | ORAL | 1 refills | Status: DC
  Filled 2024-01-08: qty 180, 90d supply, fill #0
  Filled 2024-03-29: qty 180, 90d supply, fill #1

## 2024-01-21 DIAGNOSIS — R0683 Snoring: Secondary | ICD-10-CM | POA: Diagnosis not present

## 2024-01-21 DIAGNOSIS — E1165 Type 2 diabetes mellitus with hyperglycemia: Secondary | ICD-10-CM | POA: Diagnosis not present

## 2024-01-21 DIAGNOSIS — I4891 Unspecified atrial fibrillation: Secondary | ICD-10-CM | POA: Diagnosis not present

## 2024-01-21 DIAGNOSIS — G4734 Idiopathic sleep related nonobstructive alveolar hypoventilation: Secondary | ICD-10-CM | POA: Diagnosis not present

## 2024-01-21 DIAGNOSIS — I10 Essential (primary) hypertension: Secondary | ICD-10-CM | POA: Diagnosis not present

## 2024-01-21 DIAGNOSIS — I25709 Atherosclerosis of coronary artery bypass graft(s), unspecified, with unspecified angina pectoris: Secondary | ICD-10-CM | POA: Diagnosis not present

## 2024-02-03 ENCOUNTER — Other Ambulatory Visit: Payer: Self-pay | Admitting: Family Medicine

## 2024-02-03 DIAGNOSIS — E118 Type 2 diabetes mellitus with unspecified complications: Secondary | ICD-10-CM

## 2024-02-04 ENCOUNTER — Other Ambulatory Visit (HOSPITAL_BASED_OUTPATIENT_CLINIC_OR_DEPARTMENT_OTHER): Payer: Self-pay

## 2024-02-04 MED ORDER — EMPAGLIFLOZIN 25 MG PO TABS
25.0000 mg | ORAL_TABLET | Freq: Every day | ORAL | 1 refills | Status: DC
  Filled 2024-02-04: qty 90, 90d supply, fill #0
  Filled 2024-05-04: qty 90, 90d supply, fill #1

## 2024-02-19 ENCOUNTER — Other Ambulatory Visit (HOSPITAL_BASED_OUTPATIENT_CLINIC_OR_DEPARTMENT_OTHER): Payer: Self-pay

## 2024-02-19 MED ORDER — FLUZONE HIGH-DOSE 0.5 ML IM SUSY
0.5000 mL | PREFILLED_SYRINGE | Freq: Once | INTRAMUSCULAR | 0 refills | Status: AC
  Filled 2024-02-19: qty 0.5, 1d supply, fill #0

## 2024-02-19 MED ORDER — COMIRNATY 30 MCG/0.3ML IM SUSY
0.3000 mL | PREFILLED_SYRINGE | Freq: Once | INTRAMUSCULAR | 0 refills | Status: AC
  Filled 2024-02-19: qty 0.3, 1d supply, fill #0

## 2024-02-26 DIAGNOSIS — N1832 Chronic kidney disease, stage 3b: Secondary | ICD-10-CM | POA: Diagnosis not present

## 2024-02-27 DIAGNOSIS — G4761 Periodic limb movement disorder: Secondary | ICD-10-CM | POA: Diagnosis not present

## 2024-02-27 DIAGNOSIS — G4733 Obstructive sleep apnea (adult) (pediatric): Secondary | ICD-10-CM | POA: Diagnosis not present

## 2024-03-01 ENCOUNTER — Other Ambulatory Visit (HOSPITAL_BASED_OUTPATIENT_CLINIC_OR_DEPARTMENT_OTHER): Payer: Self-pay

## 2024-03-10 DIAGNOSIS — I129 Hypertensive chronic kidney disease with stage 1 through stage 4 chronic kidney disease, or unspecified chronic kidney disease: Secondary | ICD-10-CM | POA: Diagnosis not present

## 2024-03-10 DIAGNOSIS — D631 Anemia in chronic kidney disease: Secondary | ICD-10-CM | POA: Diagnosis not present

## 2024-03-10 DIAGNOSIS — N2581 Secondary hyperparathyroidism of renal origin: Secondary | ICD-10-CM | POA: Diagnosis not present

## 2024-03-10 DIAGNOSIS — N1832 Chronic kidney disease, stage 3b: Secondary | ICD-10-CM | POA: Diagnosis not present

## 2024-03-24 DIAGNOSIS — N1832 Chronic kidney disease, stage 3b: Secondary | ICD-10-CM | POA: Diagnosis not present

## 2024-03-31 ENCOUNTER — Other Ambulatory Visit (HOSPITAL_BASED_OUTPATIENT_CLINIC_OR_DEPARTMENT_OTHER): Payer: Self-pay

## 2024-03-31 MED ORDER — KERENDIA 10 MG PO TABS
10.0000 mg | ORAL_TABLET | Freq: Every day | ORAL | 11 refills | Status: DC
  Filled 2024-03-31: qty 30, 30d supply, fill #0

## 2024-04-24 NOTE — Progress Notes (Signed)
 " Cardiology Office Note:  .   Date:  04/29/2024  ID:  [name] [name], [name] [name] 07/02/1945, MRN [id number] PCP: Ozell Heron HERO, MD  [facility] Providers Cardiologist:  Lonni LITTIE Nanas, MD Cardiology APP:  Parthenia Olivia HERO, PA-C    History of Present Illness: .   [name] [name] is a 79 y.o. male with CAD s/p CABG, hypertension, hyperlipidemia, DM, PAF and LBBB here for follow up.  He was previously a patient of Dr. Claudene.  He was last seen 07/2021 and doing well.  SImvastatin  was switched to rosuvastatin . He had postoperative atrial fibrillation.  He was initially on coumadin  which was subsequently discontinued.  He was last seen in atrial fibrillation clinic 11/24/23 and was in sinus rhythm.   Discussed the use of AI scribe software for clinical note transcription with the patient, who gave verbal consent to proceed.  History of Present Illness [name] [name] has a history of coronary artery disease and underwent bypass surgery. His medications, including Eliquis , metoprolol , amlodipine , irbesartan , and hydroxychloroquine, have been adjusted and are working well, with no recent issues of fluctuating blood pressure. He does not regularly check his blood pressure at home but notes that it has been stable.  In July, during a routine six-month appointment, his heart rhythm was noted to be different, and an EKG confirmed atrial fibrillation (AFib). 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 He attributes the recent episode to stress related to family matters, which has since resolved. He was referred to an AFib clinic where no AFib was detected. He does not recall any alarming heart rate during the episode.  He engages in physical activities such as yard work, housework, and shopping. During warmer weather, he participated in weekly activities at the [facility], which he has not continued due to colder weather. He also babysits his  68-month-old great-granddaughter once a week, which he considers part of his physical activity.  His cholesterol medication was adjusted by a previous doctor, and his cholesterol levels have been stable.  ROS:  As per HPI  Studies Reviewed: SABRA       LEFT HEART CATH AND CORONARY ANGIOGRAPHY 08/03/2015   Narrative 1. Prox RCA lesion, 99% stenosed. 2. Prox Cx to Mid Cx lesion, 100% stenosed. 3. Mid LAD lesion, 90% stenosed. 4. Dist LAD lesion, 60% stenosed.    Severe three-vessel coronary disease in 79 year old diabetic patient with 90% proximal LAD arising after the second diagonal but before the first several perforated, total occlusion of the proximal circumflex, and high grade obstruction in the proximal RCA (dominant vessel)  Overall normal LV function with EF 55%     RECOMMENDATIONS:    CABG per TCTS    Risk Assessment/Calculations:    CHA2DS2-VASc Score = 5   This indicates a 7.2% annual risk of stroke. The patient's score is based upon: CHF History: 0 HTN History: 1 Diabetes History: 1 Stroke History: 0 Vascular Disease History: 1 Age Score: 2 Gender Score: 0        Physical Exam:   VS:  BP 126/64 (BP Location: Right Arm, Patient Position: Sitting, Cuff Size: Normal)   Pulse 76   Ht 5' 7 (1.702 m)   Wt 175 lb 4.8 oz (79.5 kg)   SpO2 98%   BMI 27.46 kg/m  , BMI Body mass index is 27.46 kg/m. GENERAL:  Well appearing HEENT: Pupils equal round and reactive, fundi not visualized, oral mucosa unremarkable NECK:  No jugular venous distention, waveform within normal limits, carotid upstroke brisk and symmetric, no bruits, no thyromegaly LUNGS:  Clear to auscultation bilaterally HEART:  RRR.  PMI not displaced or sustained,S1 and S2 within normal limits, no S3, no S4, no clicks, no rubs, no murmurs ABD:  Flat, positive bowel sounds normal in frequency in pitch, no bruits, no rebound, no guarding, no midline pulsatile mass, no hepatomegaly, no splenomegaly EXT:   2 plus pulses throughout, no edema, no cyanosis no clubbing SKIN:  No rashes no nodules NEURO:  Cranial nerves II through XII grossly intact, motor grossly intact throughout PSYCH:  Cognitively intact, oriented to person place and time   ASSESSMENT AND PLAN: .    Assessment & Plan # Paroxysmal atrial fibrillation Recent episode in July with heart rate of 50 bpm, asymptomatic. Adequate heart rate control with metoprolol . - Continue Eliquis  for anticoagulation. - Discontinued aspirin  to reduce bleeding risk.  # Coronary artery disease, status post coronary artery bypass grafting # Hyperlipidemia:  Medications well-managed.  He is active and has no angina.  Continue amlodipine , metoprolol  and rosuvastatin .  Will stop aspirin  given that he is on Eliquis .   # Primary hypertension Blood pressure well-controlled with current medication regimen. - Continue amlodipine , irbesartan /hydrochlorothiazide , and metoprolol .      Dispo: f/u in 1 year  Signed, Annabella Scarce, MD   "

## 2024-04-25 ENCOUNTER — Ambulatory Visit (INDEPENDENT_AMBULATORY_CARE_PROVIDER_SITE_OTHER): Admitting: Cardiovascular Disease

## 2024-04-25 VITALS — BP 126/64 | HR 76 | Ht 67.0 in | Wt 175.3 lb

## 2024-04-25 DIAGNOSIS — I10 Essential (primary) hypertension: Secondary | ICD-10-CM | POA: Diagnosis not present

## 2024-04-25 DIAGNOSIS — N1832 Chronic kidney disease, stage 3b: Secondary | ICD-10-CM

## 2024-04-25 DIAGNOSIS — I480 Paroxysmal atrial fibrillation: Secondary | ICD-10-CM

## 2024-04-25 NOTE — Patient Instructions (Addendum)
 Medication Instructions:  Your physician has recommended you make the following change in your medication:  1.) stop aspirin   *If you need a refill on your cardiac medications before your next appointment, please call your pharmacy*  Lab Work: none  Testing/Procedures: none  Follow-Up: At [facility], you and your health needs are our priority.  As part of our continuing mission to provide you with exceptional heart care, our providers are all part of one team.  This team includes your primary Cardiologist (physician) and Advanced Practice Providers or APPs (Physician Assistants and Nurse Practitioners) who all work together to provide you with the care you need, when you need it.  Your next appointment:   12 month(s)  Provider:   Annabella Scarce, MD or Reche Finder, NP

## 2024-04-29 ENCOUNTER — Encounter (HOSPITAL_BASED_OUTPATIENT_CLINIC_OR_DEPARTMENT_OTHER): Payer: Self-pay | Admitting: Cardiovascular Disease

## 2024-05-04 ENCOUNTER — Other Ambulatory Visit: Payer: Self-pay | Admitting: Family Medicine

## 2024-05-04 ENCOUNTER — Other Ambulatory Visit (HOSPITAL_BASED_OUTPATIENT_CLINIC_OR_DEPARTMENT_OTHER): Payer: Self-pay

## 2024-05-04 DIAGNOSIS — I10 Essential (primary) hypertension: Secondary | ICD-10-CM

## 2024-05-04 MED ORDER — IRBESARTAN-HYDROCHLOROTHIAZIDE 300-12.5 MG PO TABS
1.0000 | ORAL_TABLET | Freq: Every day | ORAL | 1 refills | Status: AC
  Filled 2024-05-04: qty 90, 90d supply, fill #0

## 2024-05-09 ENCOUNTER — Ambulatory Visit: Admitting: Family Medicine

## 2024-05-10 ENCOUNTER — Other Ambulatory Visit (HOSPITAL_COMMUNITY): Payer: Self-pay

## 2024-05-10 ENCOUNTER — Encounter: Payer: Self-pay | Admitting: Family Medicine

## 2024-05-10 ENCOUNTER — Telehealth (HOSPITAL_BASED_OUTPATIENT_CLINIC_OR_DEPARTMENT_OTHER): Payer: Self-pay

## 2024-05-10 ENCOUNTER — Other Ambulatory Visit: Payer: Self-pay

## 2024-05-10 ENCOUNTER — Other Ambulatory Visit (HOSPITAL_BASED_OUTPATIENT_CLINIC_OR_DEPARTMENT_OTHER): Payer: Self-pay

## 2024-05-10 ENCOUNTER — Ambulatory Visit: Admitting: Family Medicine

## 2024-05-10 VITALS — BP 130/58 | HR 60 | Temp 98.5°F | Ht 67.0 in | Wt 179.1 lb

## 2024-05-10 DIAGNOSIS — E1121 Type 2 diabetes mellitus with diabetic nephropathy: Secondary | ICD-10-CM

## 2024-05-10 DIAGNOSIS — Z7985 Long-term (current) use of injectable non-insulin antidiabetic drugs: Secondary | ICD-10-CM | POA: Diagnosis not present

## 2024-05-10 DIAGNOSIS — Z7984 Long term (current) use of oral hypoglycemic drugs: Secondary | ICD-10-CM

## 2024-05-10 DIAGNOSIS — E7849 Other hyperlipidemia: Secondary | ICD-10-CM

## 2024-05-10 DIAGNOSIS — I480 Paroxysmal atrial fibrillation: Secondary | ICD-10-CM | POA: Diagnosis not present

## 2024-05-10 DIAGNOSIS — I10 Essential (primary) hypertension: Secondary | ICD-10-CM

## 2024-05-10 DIAGNOSIS — L03032 Cellulitis of left toe: Secondary | ICD-10-CM

## 2024-05-10 LAB — COMPREHENSIVE METABOLIC PANEL WITH GFR
ALT: 15 U/L (ref 3–53)
AST: 18 U/L (ref 5–37)
Albumin: 4.5 g/dL (ref 3.5–5.2)
Alkaline Phosphatase: 76 U/L (ref 39–117)
BUN: 29 mg/dL — ABNORMAL HIGH (ref 6–23)
CO2: 27 meq/L (ref 19–32)
Calcium: 10.3 mg/dL (ref 8.4–10.5)
Chloride: 100 meq/L (ref 96–112)
Creatinine, Ser: 1.74 mg/dL — ABNORMAL HIGH (ref 0.40–1.50)
GFR: 37.33 mL/min — ABNORMAL LOW
Glucose, Bld: 143 mg/dL — ABNORMAL HIGH (ref 70–99)
Potassium: 4.7 meq/L (ref 3.5–5.1)
Sodium: 137 meq/L (ref 135–145)
Total Bilirubin: 0.7 mg/dL (ref 0.2–1.2)
Total Protein: 7.1 g/dL (ref 6.0–8.3)

## 2024-05-10 LAB — LIPID PANEL
Cholesterol: 105 mg/dL (ref 28–200)
HDL: 32.2 mg/dL — ABNORMAL LOW
LDL Cholesterol: 27 mg/dL (ref 10–99)
NonHDL: 72.6
Total CHOL/HDL Ratio: 3
Triglycerides: 226 mg/dL — ABNORMAL HIGH (ref 10.0–149.0)
VLDL: 45.2 mg/dL — ABNORMAL HIGH (ref 0.0–40.0)

## 2024-05-10 LAB — MICROALBUMIN / CREATININE URINE RATIO
Creatinine,U: 65.2 mg/dL
Microalb Creat Ratio: 45.4 mg/g — ABNORMAL HIGH (ref 0.0–30.0)
Microalb, Ur: 3 mg/dL — ABNORMAL HIGH (ref 0.7–1.9)

## 2024-05-10 LAB — POCT GLYCOSYLATED HEMOGLOBIN (HGB A1C): Hemoglobin A1C: 7.7 % — AB (ref 4.0–5.6)

## 2024-05-10 MED ORDER — METFORMIN HCL ER 500 MG PO TB24
1000.0000 mg | ORAL_TABLET | Freq: Every day | ORAL | 1 refills | Status: AC
  Filled 2024-05-10: qty 180, 90d supply, fill #0

## 2024-05-10 MED ORDER — OZEMPIC (0.25 OR 0.5 MG/DOSE) 2 MG/3ML ~~LOC~~ SOPN
PEN_INJECTOR | SUBCUTANEOUS | 2 refills | Status: AC
  Filled 2024-05-10: qty 3, 42d supply, fill #0

## 2024-05-10 MED ORDER — AMLODIPINE BESYLATE 5 MG PO TABS
5.0000 mg | ORAL_TABLET | Freq: Every day | ORAL | 1 refills | Status: AC
  Filled 2024-05-10: qty 90, 90d supply, fill #0

## 2024-05-10 MED ORDER — CEPHALEXIN 500 MG PO CAPS
500.0000 mg | ORAL_CAPSULE | Freq: Four times a day (QID) | ORAL | 0 refills | Status: AC
  Filled 2024-05-10: qty 28, 7d supply, fill #0

## 2024-05-10 MED ORDER — EMPAGLIFLOZIN 25 MG PO TABS
25.0000 mg | ORAL_TABLET | Freq: Every day | ORAL | 1 refills | Status: AC
  Filled 2024-05-10: qty 90, 90d supply, fill #0

## 2024-05-10 MED ORDER — APIXABAN 5 MG PO TABS
5.0000 mg | ORAL_TABLET | Freq: Two times a day (BID) | ORAL | 5 refills | Status: AC
  Filled 2024-05-10 – 2024-05-18 (×2): qty 60, 30d supply, fill #0

## 2024-05-10 NOTE — Telephone Encounter (Signed)
 Pharmacy Patient Advocate Encounter   Received notification from Pt Calls Messages that prior authorization for Ozempic  (0.25 or 0.5 MG/DOSE) 2MG /3ML pen-injectors  is required/requested.   Insurance verification completed.   The patient is insured through [employer].   Per test claim: PA required; PA submitted to above mentioned insurance via Latent Key/confirmation #/EOC [id number] Status is pending

## 2024-05-10 NOTE — Progress Notes (Signed)
 "  Established Patient Office Visit  Subjective   Patient ID: [name] [name], male    DOB: 1945-09-07  Age: 79 y.o. MRN: [id number]  Chief Complaint  Patient presents with   Medical Management of Chronic Issues    HPI  Discussed the use of AI scribe software for clinical note transcription with the patient, who gave verbal consent to proceed.  History of Present Illness   [name] [name] is a 79 year old male with atrial fibrillation and diabetes who presents for a midyear visit and lab work.  He has intermittent atrial fibrillation episodes often associated with stress. He is on Eliquis  for stroke prevention and has stopped baby aspirin . Prior evaluation in the atrial fibrillation lab did not detect atrial fibrillation.  His diabetes shows fluctuating control with a recent A1c of 7.7, up from 7.5. He is on metformin  and Jardiance . His creatinine was 2.1 in November.  He had home and in-lab sleep studies that showed mild sleep apnea that did not require treatment.  He has had a left great toenail problem for about a month with cracked skin and pus but minimal pain. He uses peroxide and triple antibiotic ointment. He has no numbness, burning, or tingling in his feet, but they are occasionally cold. He does not see a podiatrist.  His current medications are Eliquis , metformin , and Jardiance . His wife manages his refills and he is up to date on his medications.      Current Outpatient Medications  Medication Instructions   amLODipine  (NORVASC ) 5 mg, Oral, Daily   apixaban  (ELIQUIS ) 5 mg, Oral, 2 times daily   cephALEXin  (KEFLEX ) 500 mg, Oral, 4 times daily   Cyanocobalamin (B-12) 2500 MCG TABS 1 tablet, 3 times weekly   empagliflozin  (JARDIANCE ) 25 mg, Oral, Daily before breakfast   irbesartan -hydrochlorothiazide  (AVALIDE) 300-12.5 MG tablet 1 tablet, Oral, Daily   metFORMIN  (GLUCOPHAGE -XR) 1,000 mg, Oral, Daily   metoprolol  tartrate (LOPRESSOR ) 25 mg, Oral, 2 times daily   Multiple  Vitamin (MULTIVITAMIN ADULT PO) 1 tablet, Every morning   rosuvastatin  (CRESTOR ) 20 mg, Oral, Daily   Semaglutide ,0.25 or 0.5MG /DOS, (OZEMPIC , 0.25 OR 0.5 MG/DOSE,) 2 MG/3ML SOPN Inject 0.25 mg into the skin once a week for 28 days, THEN 0.5 mg once a week thereafter.   Vitamin D 2,000 Units, 3 times weekly    Patient Active Problem List   Diagnosis Date Noted   Diabetic nephropathy associated with type 2 diabetes mellitus (HCC) 05/10/2024   CKD stage 3b, GFR 30-44 ml/min (HCC) 02/04/2023   Encounter for therapeutic drug monitoring 08/20/2015   Coronary artery disease involving coronary bypass graft of native heart with angina pectoris 08/13/2015   Diabetes mellitus type 2 with complications (HCC) 08/03/2015   Hyperlipidemia 08/03/2015   Essential hypertension 08/03/2015   Atrial fibrillation (HCC)      Review of Systems  All other systems reviewed and are negative.     Objective:     BP (!) 130/58   Pulse 60   Temp 98.5 F (36.9 C) (Oral)   Ht 5' 7 (1.702 m)   Wt 179 lb 1.6 oz (81.2 kg)   SpO2 98%   BMI 28.05 kg/m    Physical Exam Vitals reviewed.  Constitutional:      Appearance: Normal appearance. He is normal weight.  Cardiovascular:     Rate and Rhythm: Normal rate and regular rhythm.     Pulses: Normal pulses.     Heart sounds: No murmur heard. Pulmonary:  Effort: Pulmonary effort is normal.     Breath sounds: Normal breath sounds. No wheezing.  Neurological:     Mental Status: He is alert and oriented to person, place, and time. Mental status is at baseline.  Psychiatric:        Mood and Affect: Mood normal.        Behavior: Behavior normal.    Diabetic Foot Exam - Simple   Simple Foot Form Diabetic Foot exam was performed with the following findings: Yes 05/10/2024 10:05 AM  Visual Inspection See comments: Yes Sensation Testing Intact to touch and monofilament testing bilaterally: Yes Pulse Check Posterior Tibialis and Dorsalis pulse intact  bilaterally: Yes Comments Left great toe shows sloughing of skin around the tip, erythema, the toenail is brittle and chipping, there is exudate on the medial promximal side of the toenail       Results for orders placed or performed in visit on 05/10/24  POC HgB A1c  Result Value Ref Range   Hemoglobin A1C 7.7 (A) 4.0 - 5.6 %   HbA1c POC (<> result, manual entry)     HbA1c, POC (prediabetic range)     HbA1c, POC (controlled diabetic range)        The ASCVD Risk score (Arnett DK, et al., 2019) failed to calculate for the following reasons:   The valid total cholesterol range is 130 to 320 mg/dL    Assessment & Plan:  Diabetic nephropathy associated with type 2 diabetes mellitus (HCC) -     POCT glycosylated hemoglobin (Hb A1C) -     Collection capillary blood specimen -     Comprehensive metabolic panel with GFR; Future -     Lipid panel; Future -     Microalbumin / creatinine urine ratio; Future -     metFORMIN  HCl ER; Take 2 tablets (1,000 mg total) by mouth daily.  Dispense: 180 tablet; Refill: 1 -     Empagliflozin ; Take 1 tablet (25 mg total) by mouth daily before breakfast.  Dispense: 90 tablet; Refill: 1 -     Ozempic  (0.25 or 0.5 MG/DOSE); Inject 0.25 mg into the skin once a week for 28 days, THEN 0.5 mg once a week thereafter.  Dispense: 3 mL; Refill: 2  Other hyperlipidemia  Essential hypertension -     amLODIPine  Besylate; Take 1 tablet (5 mg total) by mouth daily.  Dispense: 90 tablet; Refill: 1  Paroxysmal atrial fibrillation (HCC) -     Apixaban ; Take 1 tablet (5 mg total) by mouth 2 (two) times daily.  Dispense: 60 tablet; Refill: 5  Paronychia of great toe of left foot -     Cephalexin ; Take 1 capsule (500 mg total) by mouth 4 (four) times daily for 7 days.  Dispense: 28 capsule; Refill: 0 -     Ambulatory referral to Podiatry   Assessment and Plan    Paronychia and cellulitis of the left great toe Paronychia and cellulitis of the left great toe with  pus and sloughing skin, present for about a month. No significant pain reported. Possible infection requiring antibiotics. 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 Current A1c is 7.7, slightly increased from previous 7.5. Metformin  dose cannot be increased  due to kidney disease. Consideration of transitioning to Ozempic  due to its benefits in kidney protection, cardiovascular risk reduction, and blood sugar control. Discussed potential side effects of Ozempic  including constipation, nausea, and decreased appetite. Emphasized the importance of a healthy diet and monitoring for side effects. - Started Ozempic  at 0.25 mg weekly, will increase to 0.5 mg after one month - Continue metformin  while transitioning to Ozempic  - Scheduled follow-up in 3 months to check A1c and assess response to Ozempic  - Ordered labs to check kidney function and cholesterol  Paroxysmal atrial fibrillation Paroxysmal atrial fibrillation, previously noted during stress. Recent AFib lab did not detect AFib. Currently managed with Eliquis  for stroke prevention. Baby aspirin  discontinued due to Eliquis  use. - Continue Eliquis  for stroke prevention - Refilled Eliquis  prescription        Return in about 3 months (around 08/08/2024) for DM.    Heron CHRISTELLA Sharper, MD "

## 2024-05-11 ENCOUNTER — Other Ambulatory Visit (HOSPITAL_COMMUNITY): Payer: Self-pay

## 2024-05-11 ENCOUNTER — Other Ambulatory Visit (HOSPITAL_BASED_OUTPATIENT_CLINIC_OR_DEPARTMENT_OTHER): Payer: Self-pay

## 2024-05-11 NOTE — Telephone Encounter (Signed)
 Pharmacy Patient Advocate Encounter  Received notification from [employer] that Prior Authorization for  Ozempic  (0.25 or 0.5 MG/DOSE) 2MG /3ML pen-injectors  has been APPROVED from 05/11/24 to 05/11/25. Ran test claim, Copay is $0. This test claim was processed through [facility]- copay amounts may vary at other pharmacies due to pharmacy/plan contracts, or as the patient moves through the different stages of their insurance plan.   PA #/Case ID/Reference #: [id number]

## 2024-05-12 ENCOUNTER — Ambulatory Visit: Admitting: Podiatry

## 2024-05-12 DIAGNOSIS — E1151 Type 2 diabetes mellitus with diabetic peripheral angiopathy without gangrene: Secondary | ICD-10-CM

## 2024-05-12 DIAGNOSIS — L84 Corns and callosities: Secondary | ICD-10-CM

## 2024-05-12 DIAGNOSIS — L97521 Non-pressure chronic ulcer of other part of left foot limited to breakdown of skin: Secondary | ICD-10-CM | POA: Diagnosis not present

## 2024-05-16 ENCOUNTER — Other Ambulatory Visit (HOSPITAL_COMMUNITY): Payer: Self-pay

## 2024-05-16 ENCOUNTER — Ambulatory Visit: Payer: Self-pay | Admitting: Family Medicine

## 2024-05-17 NOTE — Progress Notes (Signed)
 Subjective:   Patient ID: [name] [name], male   DOB: 79 y.o.   MRN: [id number]   HPI Patient presents stating that he has had trauma to the distal tip of his hallux and admits that he did this to himself states he is just concerned about the color discoloration and also seems to experience circulatory issues with gait.  Patient is on a blood thinner with history of A-fib   Review of Systems  All other systems reviewed and are negative.       Objective:  Physical Exam Vitals and nursing note reviewed.  Constitutional:      Appearance: He is well-developed.  Pulmonary:     Effort: Pulmonary effort is normal.  Musculoskeletal:        General: Normal range of motion.  Skin:    General: Skin is warm.  Neurological:     Mental Status: He is alert.     Vascular status indicates significant diminishment of pulses left over right with a breakdown of tissue distal left strictly secondary to him trying to trim the toenail that is localized.  Good digital perfusion at this point the patient is well oriented with caregiver     Assessment:  Probability for vascular disease which may be present but not severe with trauma that the patient did to himself that appears to be healing     Plan:  H&P reviewed I did carefully debride out a little bit of nailbed medial side of the hallux no drainage was noted I advised on soaks I advised on open toed shoes and not exposing to cold.  Patient will be checked back if symptoms indicate

## 2024-05-18 ENCOUNTER — Ambulatory Visit (HOSPITAL_COMMUNITY)
Admission: RE | Admit: 2024-05-18 | Discharge: 2024-05-18 | Disposition: A | Discharge status: Home / Self Care | Source: Ambulatory Visit | Attending: Podiatry | Admitting: Podiatry

## 2024-05-18 ENCOUNTER — Ambulatory Visit: Payer: Self-pay | Admitting: Podiatry

## 2024-05-18 ENCOUNTER — Other Ambulatory Visit (HOSPITAL_BASED_OUTPATIENT_CLINIC_OR_DEPARTMENT_OTHER): Payer: Self-pay

## 2024-05-18 DIAGNOSIS — L97521 Non-pressure chronic ulcer of other part of left foot limited to breakdown of skin: Secondary | ICD-10-CM | POA: Diagnosis not present

## 2024-05-18 DIAGNOSIS — E1151 Type 2 diabetes mellitus with diabetic peripheral angiopathy without gangrene: Secondary | ICD-10-CM | POA: Insufficient documentation

## 2024-05-18 LAB — VAS US ABI WITH/WO TBI
Left ABI: 0.57
Right ABI: 0.99

## 2024-05-18 NOTE — Progress Notes (Signed)
 Related message to patient will schedule with Vascular.

## 2024-05-18 NOTE — Progress Notes (Signed)
 Should be seen by vascular to see if they can improve circulation left

## 2024-05-19 ENCOUNTER — Ambulatory Visit: Admitting: Podiatry

## 2024-05-19 ENCOUNTER — Encounter: Payer: Self-pay | Admitting: Podiatry

## 2024-05-19 DIAGNOSIS — L97521 Non-pressure chronic ulcer of other part of left foot limited to breakdown of skin: Secondary | ICD-10-CM | POA: Diagnosis not present

## 2024-05-19 DIAGNOSIS — R202 Paresthesia of skin: Secondary | ICD-10-CM | POA: Insufficient documentation

## 2024-05-19 DIAGNOSIS — E1151 Type 2 diabetes mellitus with diabetic peripheral angiopathy without gangrene: Secondary | ICD-10-CM

## 2024-05-19 DIAGNOSIS — L853 Xerosis cutis: Secondary | ICD-10-CM | POA: Insufficient documentation

## 2024-05-19 DIAGNOSIS — L570 Actinic keratosis: Secondary | ICD-10-CM | POA: Insufficient documentation

## 2024-05-19 NOTE — Progress Notes (Signed)
 Subjective:   Patient ID: [name] [name], male   DOB: 79 y.o.   MRN: [id number]   HPI Patient states it seems to be doing better and I also wanted to check  the outside of the foot.   ROS      Objective:  Physical Exam  Neurovascular status unchanged does have what appears to be vascular disease and I have asked that he be seen by the vascular doctors at their discretion and sent a note to them.  At this point it is not an emergency situation and he does appear to be healing with crusted tissue on the left hallux and crusted tissue head of fifth metatarsal localized no erythema edema or drainage noted     Assessment:  Patient with moderate vascular disease who is healing from having had a traumatic event of his left hallux with crusted tissue and slight crusted tissue fifth metatarsal no current drainage     Plan:  H&P reviewed this with him and again he will hopefully be seen by vascular based on their evaluation of his vascular studies and I gave strict instructions if there should be any worsening of symptoms or if further changes he is to let me know immediately.  Looks like the event he has right now should heal uneventfully and he is discharged currently

## 2024-06-20 ENCOUNTER — Ambulatory Visit: Payer: PPO
